# Patient Record
Sex: Male | Born: 1945 | Race: White | Hispanic: No | Smoking: Never smoker
Health system: Southern US, Community
[De-identification: ages and names within clinical notes are randomized; demographics above are authoritative.]

## PROBLEM LIST (undated history)

## (undated) DIAGNOSIS — H269 Unspecified cataract: Secondary | ICD-10-CM

## (undated) DIAGNOSIS — F988 Other specified behavioral and emotional disorders with onset usually occurring in childhood and adolescence: Secondary | ICD-10-CM

## (undated) DIAGNOSIS — M1990 Unspecified osteoarthritis, unspecified site: Secondary | ICD-10-CM

## (undated) DIAGNOSIS — N400 Enlarged prostate without lower urinary tract symptoms: Secondary | ICD-10-CM

## (undated) DIAGNOSIS — G4730 Sleep apnea, unspecified: Secondary | ICD-10-CM

## (undated) DIAGNOSIS — T7840XA Allergy, unspecified, initial encounter: Secondary | ICD-10-CM

## (undated) DIAGNOSIS — K589 Irritable bowel syndrome without diarrhea: Secondary | ICD-10-CM

## (undated) DIAGNOSIS — F32A Depression, unspecified: Secondary | ICD-10-CM

## (undated) DIAGNOSIS — E7800 Pure hypercholesterolemia, unspecified: Secondary | ICD-10-CM

## (undated) DIAGNOSIS — L719 Rosacea, unspecified: Secondary | ICD-10-CM

## (undated) DIAGNOSIS — F329 Major depressive disorder, single episode, unspecified: Secondary | ICD-10-CM

## (undated) DIAGNOSIS — I82619 Acute embolism and thrombosis of superficial veins of unspecified upper extremity: Secondary | ICD-10-CM

## (undated) DIAGNOSIS — C4491 Basal cell carcinoma of skin, unspecified: Secondary | ICD-10-CM

## (undated) DIAGNOSIS — R011 Cardiac murmur, unspecified: Secondary | ICD-10-CM

## (undated) DIAGNOSIS — N62 Hypertrophy of breast: Secondary | ICD-10-CM

## (undated) DIAGNOSIS — F419 Anxiety disorder, unspecified: Secondary | ICD-10-CM

## (undated) DIAGNOSIS — K219 Gastro-esophageal reflux disease without esophagitis: Secondary | ICD-10-CM

## (undated) HISTORY — PX: CLAVICLE SURGERY: SHX598

## (undated) HISTORY — PX: SHOULDER ARTHROSCOPY W/ ROTATOR CUFF REPAIR: SHX2400

## (undated) HISTORY — DX: Irritable bowel syndrome, unspecified: K58.9

## (undated) HISTORY — PX: COLONOSCOPY: SHX174

## (undated) HISTORY — DX: Rosacea, unspecified: L71.9

## (undated) HISTORY — PX: TONSILLECTOMY: SUR1361

## (undated) HISTORY — DX: Other specified behavioral and emotional disorders with onset usually occurring in childhood and adolescence: F98.8

## (undated) HISTORY — DX: Benign prostatic hyperplasia without lower urinary tract symptoms: N40.0

## (undated) HISTORY — PX: KNEE ARTHROSCOPY: SHX127

## (undated) HISTORY — DX: Acute embolism and thrombosis of superficial veins of unspecified upper extremity: I82.619

## (undated) HISTORY — PX: NEUROMA SURGERY: SHX722

## (undated) HISTORY — PX: BASAL CELL CARCINOMA EXCISION: SHX1214

## (undated) HISTORY — PX: JOINT REPLACEMENT: SHX530

## (undated) HISTORY — DX: Unspecified osteoarthritis, unspecified site: M19.90

## (undated) HISTORY — PX: BUNIONECTOMY: SHX129

## (undated) HISTORY — DX: Major depressive disorder, single episode, unspecified: F32.9

## (undated) HISTORY — PX: ELBOW SURGERY: SHX618

## (undated) HISTORY — DX: Sleep apnea, unspecified: G47.30

## (undated) HISTORY — DX: Gastro-esophageal reflux disease without esophagitis: K21.9

## (undated) HISTORY — PX: PLANTAR FASCIA RELEASE: SHX2239

## (undated) HISTORY — DX: Allergy, unspecified, initial encounter: T78.40XA

## (undated) HISTORY — PX: CARPAL TUNNEL RELEASE: SHX101

## (undated) HISTORY — PX: CATARACT EXTRACTION W/ INTRAOCULAR LENS  IMPLANT, BILATERAL: SHX1307

## (undated) HISTORY — PX: MOUTH SURGERY: SHX715

## (undated) HISTORY — DX: Anxiety disorder, unspecified: F41.9

## (undated) HISTORY — PX: HERNIA REPAIR: SHX51

## (undated) HISTORY — DX: Unspecified cataract: H26.9

## (undated) HISTORY — DX: Depression, unspecified: F32.A

## (undated) HISTORY — PX: CLOSED REDUCTION HAND FRACTURE: SHX973

---

## 1995-12-16 DIAGNOSIS — C4491 Basal cell carcinoma of skin, unspecified: Secondary | ICD-10-CM

## 1995-12-16 HISTORY — DX: Basal cell carcinoma of skin, unspecified: C44.91

## 1996-04-11 DIAGNOSIS — C4491 Basal cell carcinoma of skin, unspecified: Secondary | ICD-10-CM

## 1996-04-11 HISTORY — DX: Basal cell carcinoma of skin, unspecified: C44.91

## 1999-06-03 HISTORY — PX: INGUINAL HERNIA REPAIR: SUR1180

## 1999-06-28 ENCOUNTER — Ambulatory Visit (HOSPITAL_BASED_OUTPATIENT_CLINIC_OR_DEPARTMENT_OTHER): Admission: RE | Admit: 1999-06-28 | Discharge: 1999-06-28 | Payer: Self-pay | Admitting: Surgery

## 2002-05-02 ENCOUNTER — Encounter: Admission: RE | Admit: 2002-05-02 | Discharge: 2002-06-28 | Payer: Self-pay | Admitting: Neurosurgery

## 2003-07-19 ENCOUNTER — Encounter: Admission: RE | Admit: 2003-07-19 | Discharge: 2003-10-17 | Payer: Self-pay | Admitting: Family Medicine

## 2004-07-22 ENCOUNTER — Ambulatory Visit (HOSPITAL_COMMUNITY): Admission: RE | Admit: 2004-07-22 | Discharge: 2004-07-22 | Payer: Self-pay | Admitting: Gastroenterology

## 2004-07-23 ENCOUNTER — Encounter (INDEPENDENT_AMBULATORY_CARE_PROVIDER_SITE_OTHER): Payer: Self-pay | Admitting: *Deleted

## 2005-09-30 ENCOUNTER — Ambulatory Visit: Payer: Self-pay | Admitting: Internal Medicine

## 2005-10-09 ENCOUNTER — Ambulatory Visit: Payer: Self-pay | Admitting: Cardiology

## 2005-10-14 ENCOUNTER — Ambulatory Visit: Payer: Self-pay | Admitting: Internal Medicine

## 2005-11-11 ENCOUNTER — Ambulatory Visit: Payer: Self-pay | Admitting: Internal Medicine

## 2005-11-18 ENCOUNTER — Ambulatory Visit (HOSPITAL_COMMUNITY): Admission: RE | Admit: 2005-11-18 | Discharge: 2005-11-18 | Payer: Self-pay | Admitting: Internal Medicine

## 2006-01-21 ENCOUNTER — Ambulatory Visit: Payer: Self-pay | Admitting: Internal Medicine

## 2006-02-19 ENCOUNTER — Ambulatory Visit: Payer: Self-pay | Admitting: Internal Medicine

## 2007-01-27 DIAGNOSIS — D229 Melanocytic nevi, unspecified: Secondary | ICD-10-CM

## 2007-01-27 HISTORY — DX: Melanocytic nevi, unspecified: D22.9

## 2009-09-14 ENCOUNTER — Encounter: Admission: RE | Admit: 2009-09-14 | Discharge: 2009-09-14 | Payer: Self-pay | Admitting: Family Medicine

## 2010-06-26 ENCOUNTER — Other Ambulatory Visit: Payer: Self-pay | Admitting: Dermatology

## 2010-10-18 NOTE — Op Note (Signed)
NAME:  OGLE, HOEFFNER NO.:  [id number]   MEDICAL RECORD NO.:  [id number]          PATIENT TYPE:  AMB   LOCATION:  ENDO                         FACILITY:  [facility]   PHYSICIAN:  Graylin Shiver, M.D.   DATE OF BIRTH:  [birth year]   DATE OF PROCEDURE:  07/22/2004  DATE OF DISCHARGE:                                 OPERATIVE REPORT   PROCEDURE:  Esophagogastroduodenoscopy.   INDICATIONS:  Chronic heartburn.   Informed consent was obtained after explanation of the risks of bleeding,  infection and perforation.   PREMEDICATION:  Fentanyl 75 mcg IV, Versed 7.5 milligrams IV.   PROCEDURE:  With the patient in the left lateral decubitus position, the  Olympus gastroscope was inserted into the oropharynx and passed into the  esophagus. It was advanced down the esophagus then into the stomach and into  the duodenum. The second portion and bulb of the duodenum looked normal. The  stomach looked normal in its entirety including the upper fundus and cardia  seen on retroflexion. The esophagus looked normal. The esophagogastric  junction was at 40 cm. He tolerated the procedure well without  complications.   IMPRESSION:  Normal esophagogastroduodenoscopy.   In view of this patient's heartburn and relief with Prilosec I would  recommend that he remain on this medication for symptomatic control. I  suspect he has gastroesophageal reflux causing his heartburn. There is no  evidence of mucosal damage.      SFG/MEDQ  D:  07/22/2004  T:  07/23/2004  Job:  [id number]   cc:   Jethro Bastos, M.D.  [address]  [place]  [place] [address]  Fax: [phone]

## 2010-10-18 NOTE — Assessment & Plan Note (Signed)
[facility]                               PULMONARY OFFICE NOTE   NAME:FORSTERQuince, Santana                     MRN:          [id number]  DATE:01/21/2006                            DOB:          01-01-1946    SUBJECTIVE:  This is pulmonary/final followup. This  is a 65 year old white  male with chronic cough since 2006 and a negative methacholine challenge  just documented November 18, 2005 as well as previous negative sinus CT scan,  who has failed to respond to treatment directed at rhinitis, reflux, and  asthma and mainly complains of a sensation of postnasal drainage that did  not improve even on Bromophed.  However, overall, the cough is better since  starting PPIs b.i.d..   PHYSICAL EXAMINATION:  GENERAL:  He is pleasant white man in no acute  distress.  VITAL SIGNS:  Stable vital signs.  HEENT:  Minimal nonspecific turbinate edema.  Oropharynx is clear.  NECK:  Supple without cervical adenopathy or tenderness.  Trachea midline.  CHEST:  Lung fields are perfectly clear bilaterally to auscultation and  percussion.  HEART:  Regular rhythm without murmur or gallop.  ABDOMEN:  Soft, benign.  EXTREMITIES:  Warm without calf tenderness. No edema.   IMPRESSION/PLAN:  Cough, secondary to persistent sensation of postnasal  drainage in this patient who did not appear to respond to treatment directed  at reflux, asthma, and rhinitis (including Singulair) and has a negative  methacholine challenge test, although he did appear to respond at least  transiently to systemic steroids.  He is convinced that he has allergies,  and I think it is reasonable to refer him to the [facility] for  screening.  In the meantime, the best way to approach the postnasal drainage  is to add Astelin.  The other option might be to try a course of inhaled  steroids, which the record indicates has not been attempted yet, but I will  defer that issue to the Allergy Clinic.   I  suggested that he stop PPI therapy (reverse of a therapeutic trial), and  he stated, Well, I am much better off than I was before I started the  Prilosec.  Therefore, I told him it is fine to continue with it and see if  adding Astelin 2 puffs at bedtime and p.r.n. adds anything additional in  terms of control of his sensation of postnasal drainage.  Pulmonary  followup, however, can be p.r.n.Charlaine Dalton. Sherene Sires, MD, [facility]   MBW/MedQ  DD:  01/21/2006  DT:  01/22/2006  Job #:  [id number]   cc:   Jethro Bastos, MD

## 2010-10-18 NOTE — Op Note (Signed)
NAME:  FRANKO, HILLIKER NO.:  [id number]   MEDICAL RECORD NO.:  [id number]          PATIENT TYPE:  AMB   LOCATION:  ENDO                         FACILITY:  [facility]   PHYSICIAN:  Graylin Shiver, M.D.   DATE OF BIRTH:  1945-09-03   DATE OF PROCEDURE:  07/22/2004  DATE OF DISCHARGE:                                 OPERATIVE REPORT   PROCEDURE:  Colonoscopy.   INDICATIONS:  Intermittent rectal bleeding.   Informed consent was obtained after explanation of the risks of bleeding,  infection and perforation.   PREMEDICATION:  The procedure was done after an EGD without any further  medications given.   PROCEDURE:  With the patient in the left lateral decubitus position, a  rectal exam was performed. No masses were felt. The Olympus colonoscope was  inserted into the rectum and advanced around the colon to the cecum. Cecal  landmarks were identified. The cecum looked normal. In the ascending colon  there was a small 3-4 mm sessile polyp biopsied off with cold forceps. The  transverse colon looked normal. The descending colon, sigmoid and rectum all  looked normal. He tolerated the procedure well without complications.   IMPRESSION:  Small ascending colon polyp.   PLAN:  The pathology will be checked.      SFG/MEDQ  D:  07/22/2004  T:  07/23/2004  Job:  [id number]   cc:   Jethro Bastos, M.D.  [address]  [place]  [place] [address]  Fax: [phone]

## 2010-10-18 NOTE — Assessment & Plan Note (Signed)
[facility]                                ALLERGY OFFICE NOTE   Tanner [name], Tanner [name]                     MRN:          [id number]  DATE:02/19/2006                            DOB:          09/25/1945    REFERRING PHYSICIAN:  Charlaine Dalton. Sherene Sires, MD, FCCP   PROBLEM:  Allergy consultation courtesy of Dr. Sherene Sires for this 65 year old  gentleman with cough, nasal congestion and drainage.   HISTORY:  He is a never smoker who has been working with Dr. Sherene Sires for  chronic cough. A Methacholine challenge test in June 2007 was within lower  limits of normal. CT scan of the sinuses had been negative, and he did not  respond to an antihistamine decongestion product. Cough may have improved  some with PPI's. He says that working with Dr. Sherene Sires and Dr. Dorothe Pea, he has  failed to improve with a number of the common antihistamines, Singulair,  Astelin nasal spray or nasal steroids. Six days ago, he had what he calls a  typical fall and spring acute illness, described as pressure pain in the  ears, a full feeling in the frontal sinuses, much nasal discharge like  oatmeal and some increase in a perennial cough. He says he always has a  sense of drainage in the back of his throat. For a similar illness in the  mid 1970's, he had allergy skin testing, and was started on allergy shots,  but with his first two allergy injections he says he felt sick with cold  symptoms and fever, which put him to bed, so vaccine was stopped. Overall,  this pattern of chronic perennial dry cough and sensation of something in  the back of the throat have been fairly stable but with distinct seasonal  exacerbations.   MEDICATIONS:  Multivitamin, glucosamine, Ritalin, Wellbutrin XL 300 mg,  aspirin 325 mg, Astelin nasal spray sample, Prozac 20 mg, Prilosec 20 mg,  Bromfed PD.   REVIEW OF SYSTEMS:  Cough, sometimes dry, sometimes productive of mostly  clear mucus, occasional non-exertional,  non-stereotyped chest pains or  soreness, indigestion, headaches, nasal congestion, earaches, depression.  His cough varies only to the extent that it changes a little in character  from time to time. He admits definite reflux. Left nostril is more apt to be  congested. There has been no fever and no blood.   PAST MEDICAL HISTORY:  Depression and anxiety being treated by Dr.  Tomasa Rand. Tonsillectomy. 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 No other  heart or lung disease, gastrointestinal disorder, dysuria, or leg edema.   PAST SURGICAL HISTORY:  Surgeries for tonsils, neuroma, planter fasciitis,  bunion, AC joint repair and hand.   ALLERGIES:  No medication allergies. No intolerance to latex or aspirin.   SOCIAL HISTORY:  Never smoked, occasional alcohol, widowed with children. 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   ENVIRONMENTAL:  No history of urticaria, unusual reactions to food or insect  stings. Describes his home as not clean but then talks about  vacuuming it  twice a week. Indoor dog. No moisture problems. He has encasings on the  bedding, no feathers.   OBJECTIVE:  VITAL SIGNS: Weight 184 pounds, BP 122/78, Pulse regular 74,  room air saturation 99%.  GENERAL: Well built, well nourished man, somewhat pressured speech.  SKIN: Clear.  ADENOPATHY: None.  HEENT: Scarring left tympanic membrane.  Frequent throat clearing without  erythema or visible drainage.  No significant nasal obstruction.  CHEST: Slight inspiratory rasp heard at the neck and upper abdomen only on  hard inspiration. No wheeze, cough or rales.  HEART: Regular rhythm, no murmur or gallop.  EXTREMITIES: No cyanosis, clubbing or edema.  ABDOMEN: No enlargement of liver or spleen.  SKIN TEST: Positive histamine and negative diluent controls. Positive  puncture and intradermal reactions with strongest being for elm and  oak, but  additional significant reactions to some trees, house dust, and dust mite.   IMPRESSION:  1. Allergic rhinitis.  2. Cough, very suggestive of reflux induced cough.   PLAN:  1. He is going to check on the cost of allergy vaccine through his      insurance and consider whether he wants to try that therapy or not.  2. Depo-Medrol 80 mg IM.  3. Schedule return 6 weeks, earlier PRN with option to try allergy vaccine      if necessary.  4. Information on environmental precautions was given and discussed. I      appreciate the chance to meet him.                                   Clinton D. Maple Hudson, MD, FCCP, FACP   CDY/MedQ  DD:  02/19/2006  DT:  02/20/2006  Job #:  [id number]   cc:   Charlaine Dalton. Sherene Sires, MD, [facility]  Jethro Bastos, M.D.

## 2011-02-19 ENCOUNTER — Other Ambulatory Visit: Payer: Self-pay | Admitting: Dermatology

## 2011-02-19 DIAGNOSIS — C4492 Squamous cell carcinoma of skin, unspecified: Secondary | ICD-10-CM

## 2011-02-19 HISTORY — DX: Squamous cell carcinoma of skin, unspecified: C44.92

## 2011-07-08 ENCOUNTER — Other Ambulatory Visit: Payer: Self-pay | Admitting: Dermatology

## 2011-10-16 ENCOUNTER — Encounter: Payer: Self-pay | Admitting: Pulmonary Disease

## 2011-10-17 ENCOUNTER — Ambulatory Visit (INDEPENDENT_AMBULATORY_CARE_PROVIDER_SITE_OTHER): Payer: Medicare Other | Admitting: Pulmonary Disease

## 2011-10-17 ENCOUNTER — Encounter: Payer: Self-pay | Admitting: Pulmonary Disease

## 2011-10-17 VITALS — BP 118/82 | HR 67 | Temp 98.3°F | Ht 70.5 in | Wt 175.8 lb

## 2011-10-17 DIAGNOSIS — G4733 Obstructive sleep apnea (adult) (pediatric): Secondary | ICD-10-CM | POA: Insufficient documentation

## 2011-10-17 NOTE — Progress Notes (Signed)
  Subjective:    Patient ID: Tanner [name], male    DOB: [birth year], 66 y.o.   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 CPAP was titrated to 8 cm however a downward on this pressure should predominant hypopneas at 24 events per hour. Hence he was changed to BiPAP at 14/10 after titration study. However he could not find a comfortable mask. He states this is due to his oily skin. He seems to have had a large leak, he says he is multiple masks at home. He sleeps on his side in the mass could slip off. It was also felt that he had nostrils of different sizes in nasal prongs did not work well. He has not used CPAP for 2 years.  I have reviewed his prior studies, download data and consult reports from Dr. Vickey Huger. His bed partner relates that his sleepiness and snoring has improved. Epworth sleepiness score is 4/24. Bedtime is 11 PM to midnight, sleep latency is minimal, sleeps well with 1-2 awakenings without any post void sleep latency. He sleeps on his side with one pillow. Wakes up between 8:30 to 9 AM and feels rested, there is no dryness of mouth or morning headaches. He is lost 8 pounds in the last 3 years. He has been on amphetamine for attention deficit disorder since the 90s He has hyperlipidemia but no other cardiac risk factors   Review of Systems Constitutional: negative for anorexia, fevers and sweats  Eyes: negative for irritation, redness and visual disturbance  Ears, nose, mouth, throat, and face: negative for earaches, epistaxis, nasal congestion and sore throat  Respiratory: negative for cough, dyspnea on exertion, sputum and wheezing  Cardiovascular: negative for chest pain, dyspnea, lower extremity edema, orthopnea, palpitations and syncope  Gastrointestinal: negative  for abdominal pain, constipation, diarrhea, melena, nausea and vomiting  Genitourinary:negative for dysuria, frequency and hematuria  Hematologic/lymphatic: negative for bleeding, easy bruising and lymphadenopathy  Musculoskeletal:negative for arthralgias, muscle weakness and stiff joints  Neurological: negative for coordination problems, gait problems, headaches and weakness  Endocrine: negative for diabetic symptoms including polydipsia, polyuria and weight loss     Objective:   Physical Exam  Gen. Pleasant, well-nourished, in no distress, normal affect ENT - no lesions, no post nasal drip, small chin, class 2 airway Neck: No JVD, no thyromegaly, no carotid bruits Lungs: no use of accessory muscles, no dullness to percussion, clear without rales or rhonchi  Cardiovascular: Rhythm regular, heart sounds  normal, no murmurs or gallops, no peripheral edema Abdomen: soft and non-tender, no hepatosplenomegaly, BS normal. Musculoskeletal: No deformities, no cyanosis or clubbing Neuro:  alert, non focal       Assessment & Plan:

## 2011-10-17 NOTE — Patient Instructions (Signed)
Call the sleep lab [phone] for appt for mask fit We will set you up with DME to check, adjust your machine & get you supplies for another CPAP trial Call me in a week to report how this is working If not, then we can try dental appliance

## 2011-10-17 NOTE — Assessment & Plan Note (Addendum)
I still think that CPAP is the best and most effective option for moderate obstructive sleep apnea. Hopefully we can get him the right mask interface.  He will Call the sleep lab  for mask fit/ desensitization appt We will set you up with DME to check, adjust your machine & get you supplies for another CPAP trial Call me in a week to report how this is working If not, then we can try dental appliance  compliance with goal of at least 4-6 hrs every night is the expectation. Advised against medications with sedative side effects Cautioned against driving when sleepy - understanding that sleepiness will vary on a day to day basis

## 2011-10-21 ENCOUNTER — Ambulatory Visit (HOSPITAL_BASED_OUTPATIENT_CLINIC_OR_DEPARTMENT_OTHER): Payer: Medicare Other | Attending: Pulmonary Disease

## 2011-10-28 ENCOUNTER — Encounter (INDEPENDENT_AMBULATORY_CARE_PROVIDER_SITE_OTHER): Payer: Self-pay | Admitting: General Surgery

## 2011-11-12 ENCOUNTER — Other Ambulatory Visit: Payer: Self-pay | Admitting: Dermatology

## 2012-01-09 ENCOUNTER — Telehealth: Payer: Self-pay | Admitting: Pulmonary Disease

## 2012-01-09 DIAGNOSIS — G4733 Obstructive sleep apnea (adult) (pediatric): Secondary | ICD-10-CM

## 2012-01-09 NOTE — Telephone Encounter (Signed)
I spoke with pt about results. He voiced his understanding and gree'd to be set up with bipap. Order has been sent to [facility]. Will send to them to make sure they received order. Please advise thanks

## 2012-01-09 NOTE — Telephone Encounter (Signed)
Order faxed to [facility] to set pt up with Auto BiPap IPAP 10-20, EPAP 5-15. Download to be sent in 1 month to Dr. Vassie Loll. Rhonda J Cobb

## 2012-01-09 NOTE — Telephone Encounter (Signed)
Download 8/13 on auto- avg pr 11 cm Residual AHI 19/h, predom central apneas He will need BiPAP - does he have this machine already ? If not, OK to write order to DME for auto BiPAP - IPAP 10-20 EPAP 5-15, download in 1 month

## 2012-01-30 ENCOUNTER — Telehealth: Payer: Self-pay | Admitting: Pulmonary Disease

## 2012-01-30 NOTE — Telephone Encounter (Signed)
Spoke with pt. He is wanting to know how come RA chose to start him on BIPAP rather than CPAP. He was apparently under the impression that he was to start CPAP. RA, please advise, thanks

## 2012-02-01 NOTE — Telephone Encounter (Signed)
Because he had central apneas on his cpap download & bipap works better for central apneas

## 2012-02-03 NOTE — Telephone Encounter (Signed)
lmomtcb x1 

## 2012-02-04 NOTE — Telephone Encounter (Signed)
lmomtcb x2 for pt 

## 2012-02-05 NOTE — Telephone Encounter (Signed)
LMTCBx3 on home and work number listed.Carron Curie, CMA

## 2012-02-06 NOTE — Telephone Encounter (Signed)
lmomtcb x4--advised pt we have tried to reach him multiple times without a response back and if he needed anything further to give our office a call back

## 2012-02-17 ENCOUNTER — Telehealth: Payer: Self-pay | Admitting: Pulmonary Disease

## 2012-02-17 NOTE — Telephone Encounter (Signed)
LMTCB

## 2012-02-18 NOTE — Telephone Encounter (Signed)
lmomtcb x 2  

## 2012-02-19 NOTE — Telephone Encounter (Signed)
lmtcb x3 

## 2012-02-20 ENCOUNTER — Telehealth: Payer: Self-pay | Admitting: Pulmonary Disease

## 2012-02-20 NOTE — Telephone Encounter (Signed)
Lmomtcb.  Will sign off per protocol.

## 2012-02-20 NOTE — Telephone Encounter (Signed)
Tanner Milch., MD 02/01/2012 5:36 PM Signed  Because he had central apneas on his cpap download & bipap works better for central apneas Tanner [name], [facility] 01/30/2012 4:13 PM Signed  Spoke with pt. He is wanting to know how come RA chose to start him on BIPAP rather than CPAP. He was apparently under the impression that he was to start CPAP. RA, please advise, thanks  Spoke with patient-aware of reason for needing BiPAP vs CPAP; pt is willing to go forward with this. Per Almyra Free order from 01-09-12 can still be used and she is taking care of this now. Pt aware to expect a call from [facility] for setting up BiPAP. Will forward to RA as an FYI.  Pt states he had been out of town and this is the reason for not getting back to our office or [facility] about this matter.

## 2012-04-09 ENCOUNTER — Telehealth: Payer: Self-pay | Admitting: Pulmonary Disease

## 2012-04-09 NOTE — Telephone Encounter (Signed)
Will forward to Dr. Vassie Loll to see if he has download in his look at folder with him. Pt is aware will call once we know.  Please advise Dr. Vassie Loll thanks

## 2012-04-12 ENCOUNTER — Telehealth: Payer: Self-pay | Admitting: Pulmonary Disease

## 2012-04-12 NOTE — Telephone Encounter (Signed)
bipap download 10/13 on auto Residual AHI 23/h Good usage avg pr 12 cm Pl arrange FU OV

## 2012-04-14 NOTE — Telephone Encounter (Signed)
See phone note 04/14/12

## 2012-04-14 NOTE — Telephone Encounter (Signed)
I spoke with patient about results and he verbalized understanding and had no questions. Pt scheduled to see RA 04/16/12 at 9 am.

## 2012-04-16 ENCOUNTER — Encounter: Payer: Self-pay | Admitting: Pulmonary Disease

## 2012-04-16 ENCOUNTER — Ambulatory Visit (INDEPENDENT_AMBULATORY_CARE_PROVIDER_SITE_OTHER): Payer: Medicare Other | Admitting: Pulmonary Disease

## 2012-04-16 VITALS — BP 102/60 | HR 64 | Temp 97.3°F | Ht 70.0 in | Wt 183.0 lb

## 2012-04-16 DIAGNOSIS — G4733 Obstructive sleep apnea (adult) (pediatric): Secondary | ICD-10-CM

## 2012-04-16 NOTE — Patient Instructions (Signed)
Lets hold off on CPAP therapy Call me if symptoms worse

## 2012-04-16 NOTE — Progress Notes (Signed)
  Subjective:    Patient ID: Tanner [name], male    DOB: 03/27/1946, 66 y.o.   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 CPAP was titrated to 8 cm however a downward on this pressure should predominant hypopneas at 24 events per hour. Hence he was changed to BiPAP at 14/10 after titration study. However he could not find a comfortable mask. He states this is due to his oily skin. He seems to have had a large leak, he says he is multiple masks at home. He sleeps on his side in the mass could slip off. It was also felt that he had nostrils of different sizes in nasal prongs did not work well. He has not used CPAP for 2 years.  I have reviewed his prior studies, download data and consult reports from Dr. Vickey Huger.  His bed partner relates that his sleepiness and snoring has improved. Epworth sleepiness score is 4/24. Bedtime is 11 PM to midnight, sleep latency is minimal, sleeps well with 1-2 awakenings without any post void sleep latency. He sleeps on his side with one pillow. Wakes up between 8:30 to 9 AM and feels rested, there is no dryness of mouth or morning headaches. He is lost 8 pounds in the last 3 years.  He has been on amphetamine for attention deficit disorder since the 90s  He has hyperlipidemia but no other cardiac risk factors  04/16/2012 We performed a PAP trial  - he did not feel any improvement on CPAP or bipap although toelrated bipap better & was able to use it longer Data : Download 8/13 on auto- avg pr 11 cm  Residual AHI 19/h, predom central apneas >> autobipap bipap download 10/13 on auto  Residual AHI 23/h  Good usage  avg pr 12 cm     Review of Systems neg for any significant sore throat, dysphagia, itching, sneezing, nasal congestion or excess/ purulent  secretions, fever, chills, sweats, unintended wt loss, pleuritic or exertional cp, hempoptysis, orthopnea pnd or change in chronic leg swelling. Also denies presyncope, palpitations, heartburn, abdominal pain, nausea, vomiting, diarrhea or change in bowel or urinary habits, dysuria,hematuria, rash, arthralgias, visual complaints, headache, numbness weakness or ataxia.     Objective:   Physical Exam  Gen. Pleasant, in no distress ENT - no lesions, no post nasal drip, class 2 airway Neck: No JVD, no thyromegaly, no carotid bruits Lungs: no use of accessory muscles, no dullness to percussion, decreased without rales or rhonchi  Cardiovascular: Rhythm regular, heart sounds  normal, no murmurs or gallops, no peripheral edema Musculoskeletal: No deformities, no cyanosis or clubbing , no tremors       Assessment & Plan:

## 2012-04-16 NOTE — Assessment & Plan Note (Signed)
Moderate , RDI 22/h Complex - with centrals emerging on CPAP & not corrected fully on autoBipAP  I discussed OSA as a risk factor for heart disease - he is relatively low risk & opteed not to continue with CPAP therpay. Since he did not have any subjective improvement with CPAP or bipap, I do think this is ok. We discussed alternatives such as dental appliance.  We discussed symptoms that would prompt him to call us again but for now simply observation.

## 2012-07-14 ENCOUNTER — Other Ambulatory Visit: Payer: Self-pay | Admitting: Physician Assistant

## 2012-10-13 ENCOUNTER — Other Ambulatory Visit: Payer: Self-pay | Admitting: Family Medicine

## 2012-10-13 ENCOUNTER — Ambulatory Visit
Admission: RE | Admit: 2012-10-13 | Discharge: 2012-10-13 | Disposition: A | Discharge status: Home / Self Care | Payer: Medicare Other | Source: Ambulatory Visit | Attending: Family Medicine | Admitting: Family Medicine

## 2012-10-13 DIAGNOSIS — M25551 Pain in right hip: Secondary | ICD-10-CM

## 2012-10-22 ENCOUNTER — Other Ambulatory Visit: Payer: Self-pay | Admitting: Family Medicine

## 2012-10-22 DIAGNOSIS — M25551 Pain in right hip: Secondary | ICD-10-CM

## 2012-10-27 ENCOUNTER — Ambulatory Visit
Admission: RE | Admit: 2012-10-27 | Discharge: 2012-10-27 | Disposition: A | Discharge status: Home / Self Care | Payer: Medicare Other | Source: Ambulatory Visit | Attending: Family Medicine | Admitting: Family Medicine

## 2012-10-27 DIAGNOSIS — M25551 Pain in right hip: Secondary | ICD-10-CM

## 2013-01-19 ENCOUNTER — Other Ambulatory Visit: Payer: Self-pay | Admitting: Dermatology

## 2013-02-02 ENCOUNTER — Encounter (INDEPENDENT_AMBULATORY_CARE_PROVIDER_SITE_OTHER): Payer: Self-pay | Admitting: Surgery

## 2013-02-02 ENCOUNTER — Ambulatory Visit (INDEPENDENT_AMBULATORY_CARE_PROVIDER_SITE_OTHER): Payer: Medicare Other | Admitting: Surgery

## 2013-02-02 VITALS — BP 138/80 | HR 64 | Temp 98.1°F | Resp 14 | Ht 70.5 in | Wt 190.0 lb

## 2013-02-02 DIAGNOSIS — K429 Umbilical hernia without obstruction or gangrene: Secondary | ICD-10-CM

## 2013-02-02 NOTE — Progress Notes (Signed)
General Surgery [facility]  Chief Complaint  Patient presents with  . New Evaluation    eval umbilical hernia - referral from Dr. Carilyn Goodpasture Koirala    HISTORY: Patient is a 67 year old male referred by his primary care physician for management of umbilical hernia. Hernia has been present for one to 2 years. He causes mild discomfort. Patient is also concerned about a possible ventral hernia in the epigastrium. He denies any signs or symptoms of intestinal obstruction. He has undergone a previous left inguinal hernia repair in 2001.  Past Medical History  Diagnosis Date  . IBS (irritable bowel syndrome)   . Rosacea   . Depression   . ADD (attention deficit disorder)   . Esophageal reflux   . BPH (benign prostatic hypertrophy)   . OSA (obstructive sleep apnea)   . Arthritis   . Cancer     basal skin     Current Outpatient Prescriptions  Medication Sig Dispense Refill  . amphetamine-dextroamphetamine (ADDERALL XR) 30 MG 24 hr capsule Take 30 mg by mouth daily.      Marland Kitchen aspirin 81 MG tablet Take 81 mg by mouth daily.      . cetirizine (ZYRTEC) 10 MG tablet Take 10 mg by mouth daily.      . cholecalciferol (VITAMIN D) 1000 UNITS tablet Take 1,000 Units by mouth daily.      . fish oil-omega-3 fatty acids 1000 MG capsule Take 1 capsule by mouth 2 (two) times daily.      . Glucosamine-Chondroit-Vit C-Mn (GLUCOSAMINE 1500 COMPLEX PO) Take 2 capsules by mouth daily.      Marland Kitchen ibuprofen (ADVIL,MOTRIN) 200 MG tablet Take 200 mg by mouth every 6 (six) hours as needed.      . metroNIDAZOLE (METROCREAM) 0.75 % cream       . minocycline (MINOCIN,DYNACIN) 100 MG capsule       . omeprazole (PRILOSEC) 40 MG capsule Take 40 mg by mouth daily.      . sildenafil (VIAGRA) 100 MG tablet 1/4 tablet as needed      . simvastatin (ZOCOR) 10 MG tablet Take 10 mg by mouth daily.      Marland Kitchen tretinoin (RETIN-A) 0.025 % cream       . Vilazodone HCl (VIIBRYD) 10 MG TABS 1 tablet every other day       . Multiple Vitamin (MULTIVITAMIN) tablet Take 1 tablet by mouth daily.       No current facility-administered medications for this visit.    No Known Allergies  Family History  Problem Relation Age of Onset  . Heart attack Father 31  . Heart disease Father   . Lung disease Mother 64  . Diabetes Brother     History   Social History  . Marital Status: Widowed    Spouse Name: N/A    Number of Children: 2  . Years of Education: N/A   Occupational History  . retired    Social History Main Topics  . Smoking status: Never Smoker   . Smokeless tobacco: Never Used  . Alcohol Use: Yes  . Drug Use: No  . Sexual Activity: None   Other Topics Concern  . None   Social History Narrative  . None    REVIEW OF SYSTEMS - PERTINENT POSITIVES ONLY: Denies signs or symptoms of intestinal obstruction. No other abdominal operations. Intermittent discomfort radiating from the umbilicus towards the left groin.  EXAM: Filed Vitals:   02/02/13 1133  BP: 138/80  Pulse: 64  Temp: 98.1 F (36.7 C)  Resp: 14    HEENT: normocephalic; pupils equal and reactive; sclerae clear; dentition good; mucous membranes moist NECK:  No palpable masses in the thyroid bed; symmetric on extension; no palpable anterior or posterior cervical lymphadenopathy; no supraclavicular masses; no tenderness CHEST: clear to auscultation bilaterally without rales, rhonchi, or wheezes CARDIAC: regular rate and rhythm without significant murmur; peripheral pulses are full ABDOMEN: soft without distension; bowel sounds present; no mass; no hepatosplenomegaly; umbilical hernia is moderate in size; with manipulation the umbilical hernia is reducible and fascial defect measures approximately 1 cm in diameter; with setup maneuver and leg lifts there is a moderate sized rectus diastases. There is no sign of ventral hernia in the epigastrium. There is no palpable fascial defect in the epigastrium. EXT:  non-tender without  edema; no deformity NEURO: no gross focal deficits; no sign of tremor   LABORATORY RESULTS: See [facility] (CHL-Epic) for most recent results  RADIOLOGY RESULTS: See [facility] (CHL-Epic) for most recent results  IMPRESSION: #1 umbilical hernia, reducible, symptomatic #2 rectus diastases, moderate  PLAN: I discussed the above findings at length with the patient and his significant other. I provided him with written literature on umbilical hernia repair. We discussed the use of prosthetic mesh. We discussed restrictions on his activities following the procedure. We discussed the risk of recurrence of his umbilical hernia been less than 5%. He understands and wishes to proceed with surgery in the near future.  We also discussed rectus diastases. I explained that this is not a dangerous problem and does not require surgical repair. Any surgical repair would be performed by a plastic surgeon and would involve abdominoplasty. Patient understands.  The risks and benefits of the procedure have been discussed at length with the patient.  The patient understands the proposed procedure, potential alternative treatments, and the course of recovery to be expected.  All of the patient's questions have been answered at this time.  The patient wishes to proceed with surgery.  Velora Heckler, MD, FACS General & Endocrine Surgery [facility]  Primary Care Physician: Darrow Bussing, MD

## 2013-02-02 NOTE — Patient Instructions (Signed)
[facility]  HERNIA REPAIR POST OP INSTRUCTIONS  Always review your discharge instruction sheet given to you by the facility where your surgery was performed.  1. A  prescription for pain medication may be given to you upon discharge.  Take your pain medication as prescribed.  If narcotic pain medicine is not needed, then you may take acetaminophen (Tylenol) or ibuprofen (Advil) as needed.  2. Take your usually prescribed medications unless otherwise directed.  3. If you need a refill on your pain medication, please contact your pharmacy.  They will contact our office to request authorization. Prescriptions will not be filled after 5 pm daily or on weekends.  4. You should follow a light diet the first 24 hours after arrival home, such as soup and crackers or toast.  Be sure to include plenty of fluids daily.  Resume your normal diet the day after surgery.  5. Most patients will experience some swelling and bruising around the surgical site.  Ice packs and reclining will help.  Swelling and bruising can take several days to resolve.   6. It is common to experience some constipation if taking pain medication after surgery.  Increasing fluid intake and taking a stool softener (such as Colace) will usually help or prevent this problem from occurring.  A mild laxative (Milk of Magnesia or Miralax) should be taken according to package directions if there are no bowel movements after 48 hours.  7. Unless discharge instructions indicate otherwise, you may remove your bandages 24-48 hours after surgery, and you may shower at that time.  You may have steri-strips (small skin tapes) in place directly over the incision.  These strips should be left on the skin for 7-10 days.  If your surgeon used skin glue on the incision, you may shower in 24 hours.  The glue will flake off over the next 2-3 weeks.  Any sutures or staples will be removed at the office during your follow-up  visit.  8. ACTIVITIES:  You may resume regular (light) daily activities beginning the next day-such as daily self-care, walking, climbing stairs-gradually increasing activities as tolerated.  You may have sexual intercourse when it is comfortable.  Refrain from any heavy lifting or straining until approved by your doctor.  You may drive when you are no longer taking prescription pain medication, you can comfortably wear a seatbelt, and you can safely maneuver your car and apply brakes.  9. You should see your doctor in the office for a follow-up appointment approximately 2-3 weeks after your surgery.  Make sure that you call for this appointment within a day or two after you arrive home to insure a convenient appointment time. 10.   WHEN TO CALL YOUR DOCTOR: 1. Fever greater than 101.0 2. Inability to urinate 3. Persistent nausea and/or vomiting 4. Extreme swelling or bruising 5. Continued bleeding from incision 6. Increased pain, redness, or drainage from the incision  The clinic staff is available to answer your questions during regular business hours.  Please don't hesitate to call and ask to speak to one of the nurses for clinical concerns.  If you have a medical emergency, go to the nearest emergency room or call 911.  A surgeon from [facility] is always on call for the hospital.   [facility] [address], [address], , [place]  [address]  [phone] ? [phone] ? FAX [phone]  [email or web address]   

## 2013-02-21 ENCOUNTER — Telehealth (INDEPENDENT_AMBULATORY_CARE_PROVIDER_SITE_OTHER): Payer: Self-pay

## 2013-02-21 NOTE — Telephone Encounter (Signed)
This has been forwarded to Dr Gerrit Friends.

## 2013-02-21 NOTE — Telephone Encounter (Signed)
Debbie from surgical center called to let Dr Gerrit Friends know patient still has sutures in his mouth from his recent dental surgery. He has 3 more days of abx to take. His hernia surgery is scheduled next Tuesday 9/30. Debbie wants to know if this will affect his surgery in any way. Please call her at [phone] ext [phone].

## 2013-02-22 ENCOUNTER — Telehealth (INDEPENDENT_AMBULATORY_CARE_PROVIDER_SITE_OTHER): Payer: Self-pay

## 2013-02-22 NOTE — Telephone Encounter (Signed)
LMOM for Debbie at [facility] that ok to proceed with surgery per Dr Gerrit Friends.

## 2013-03-01 DIAGNOSIS — K429 Umbilical hernia without obstruction or gangrene: Secondary | ICD-10-CM

## 2013-03-01 HISTORY — PX: UMBILICAL HERNIA REPAIR: SHX196

## 2013-03-02 ENCOUNTER — Other Ambulatory Visit (INDEPENDENT_AMBULATORY_CARE_PROVIDER_SITE_OTHER): Payer: Self-pay | Admitting: *Deleted

## 2013-03-02 MED ORDER — HYDROCODONE-ACETAMINOPHEN 5-325 MG PO TABS: 1.0000 | ORAL_TABLET | ORAL | Status: DC | PRN

## 2013-03-04 ENCOUNTER — Encounter (INDEPENDENT_AMBULATORY_CARE_PROVIDER_SITE_OTHER): Payer: Self-pay | Admitting: General Surgery

## 2013-03-04 ENCOUNTER — Emergency Department (HOSPITAL_COMMUNITY): Payer: Medicare Other

## 2013-03-04 ENCOUNTER — Telehealth (INDEPENDENT_AMBULATORY_CARE_PROVIDER_SITE_OTHER): Payer: Self-pay | Admitting: General Surgery

## 2013-03-04 ENCOUNTER — Emergency Department (HOSPITAL_COMMUNITY)
Admission: EM | Admit: 2013-03-04 | Discharge: 2013-03-04 | Disposition: A | Discharge status: Home / Self Care | Payer: Medicare Other | Attending: Emergency Medicine | Admitting: Emergency Medicine

## 2013-03-04 ENCOUNTER — Encounter (HOSPITAL_COMMUNITY): Payer: Self-pay

## 2013-03-04 ENCOUNTER — Ambulatory Visit (INDEPENDENT_AMBULATORY_CARE_PROVIDER_SITE_OTHER): Payer: Medicare Other | Admitting: General Surgery

## 2013-03-04 VITALS — BP 98/60 | HR 68 | Temp 97.6°F | Resp 14 | Ht 70.0 in | Wt 188.2 lb

## 2013-03-04 DIAGNOSIS — Z8669 Personal history of other diseases of the nervous system and sense organs: Secondary | ICD-10-CM | POA: Insufficient documentation

## 2013-03-04 DIAGNOSIS — L0390 Cellulitis, unspecified: Secondary | ICD-10-CM

## 2013-03-04 DIAGNOSIS — L0291 Cutaneous abscess, unspecified: Secondary | ICD-10-CM

## 2013-03-04 DIAGNOSIS — R109 Unspecified abdominal pain: Secondary | ICD-10-CM | POA: Insufficient documentation

## 2013-03-04 DIAGNOSIS — Z79899 Other long term (current) drug therapy: Secondary | ICD-10-CM | POA: Insufficient documentation

## 2013-03-04 DIAGNOSIS — K5900 Constipation, unspecified: Secondary | ICD-10-CM

## 2013-03-04 DIAGNOSIS — Z8659 Personal history of other mental and behavioral disorders: Secondary | ICD-10-CM | POA: Insufficient documentation

## 2013-03-04 DIAGNOSIS — K219 Gastro-esophageal reflux disease without esophagitis: Secondary | ICD-10-CM | POA: Insufficient documentation

## 2013-03-04 DIAGNOSIS — K429 Umbilical hernia without obstruction or gangrene: Secondary | ICD-10-CM

## 2013-03-04 DIAGNOSIS — Z7982 Long term (current) use of aspirin: Secondary | ICD-10-CM | POA: Insufficient documentation

## 2013-03-04 DIAGNOSIS — Z85828 Personal history of other malignant neoplasm of skin: Secondary | ICD-10-CM | POA: Insufficient documentation

## 2013-03-04 DIAGNOSIS — M129 Arthropathy, unspecified: Secondary | ICD-10-CM | POA: Insufficient documentation

## 2013-03-04 DIAGNOSIS — F988 Other specified behavioral and emotional disorders with onset usually occurring in childhood and adolescence: Secondary | ICD-10-CM | POA: Insufficient documentation

## 2013-03-04 DIAGNOSIS — G8918 Other acute postprocedural pain: Secondary | ICD-10-CM | POA: Insufficient documentation

## 2013-03-04 DIAGNOSIS — L719 Rosacea, unspecified: Secondary | ICD-10-CM | POA: Insufficient documentation

## 2013-03-04 DIAGNOSIS — Z87448 Personal history of other diseases of urinary system: Secondary | ICD-10-CM | POA: Insufficient documentation

## 2013-03-04 MED ORDER — SORBITOL 70 % SOLN
960.0000 mL | TOPICAL_OIL | Freq: Once | ORAL | Status: AC
  Administered 2013-03-04: 960 mL via RECTAL
  Filled 2013-03-04: qty 240

## 2013-03-04 NOTE — ED Provider Notes (Signed)
CSN: [id number]     Arrival date & time 03/04/13  7829 History   None    Chief Complaint  Patient presents with  . Abdominal Pain   (Consider location/radiation/quality/duration/timing/severity/associated sxs/prior Treatment) The history is provided by the patient and medical records.   Pt presents to the ED for abdominal pain.  Pt recently underwent hernia repair on 03/01/13-- procedure was without complication.  Pt states he has been doing well until approx 2am this morning and he had a sudden onset of severe pain in his lower abdomen.  Pt states there is some cramping and he feels extremely bloated with a pressure sensation surrounding his rectum.  Pt has not had a BM since surgery but is freely passing gas.  No nausea or vomiting.  He has been taking the narcotic pain medication but has been using stool softener as directed.  Took MiraLax at midnight but still has not had a bowel movement.  No chest pain or SOB.  Past Medical History  Diagnosis Date  . IBS (irritable bowel syndrome)   . Rosacea   . Depression   . ADD (attention deficit disorder)   . Esophageal reflux   . BPH (benign prostatic hypertrophy)   . OSA (obstructive sleep apnea)   . Arthritis   . Cancer     basal skin    Past Surgical History  Procedure Laterality Date  . Left inguinal hernia repair    . Skin cancer removed    . Multiple orthopedic surgery    . Mouth surgery      teeth removal/implants  . Jaw implants    . Cataract extraction    . Tonsillectomy    . Left clavical    . Right knee arthroscopy    . Planter fascia    . Left foot neuroma    . Right shoulder    . Hernia repair      lingual   Family History  Problem Relation Age of Onset  . Heart attack Father 32  . Heart disease Father   . Lung disease Mother 57  . Diabetes Brother    History  Substance Use Topics  . Smoking status: Never Smoker   . Smokeless tobacco: Never Used  . Alcohol Use: Yes    Review of Systems   Gastrointestinal: Positive for abdominal pain, constipation and abdominal distention.  All other systems reviewed and are negative.    Allergies  Review of patient's allergies indicates no known allergies.  Home Medications   Current Outpatient Rx  Name  Route  Sig  Dispense  Refill  . amphetamine-dextroamphetamine (ADDERALL XR) 30 MG 24 hr capsule   Oral   Take 30 mg by mouth daily.         Marland Kitchen aspirin 81 MG tablet   Oral   Take 81 mg by mouth daily.         . cetirizine (ZYRTEC) 10 MG tablet   Oral   Take 10 mg by mouth daily.         . cholecalciferol (VITAMIN D) 1000 UNITS tablet   Oral   Take 1,000 Units by mouth daily.         . fish oil-omega-3 fatty acids 1000 MG capsule   Oral   Take 1 capsule by mouth 2 (two) times daily.         . Glucosamine-Chondroit-Vit C-Mn (GLUCOSAMINE 1500 COMPLEX PO)   Oral   Take 2 capsules by mouth daily.         Marland Kitchen  HYDROcodone-acetaminophen (NORCO) 5-325 MG per tablet   Oral   Take 1-2 tablets by mouth every 4 (four) hours as needed for pain (Given at discharge from [facility]).   30 tablet   0   . ibuprofen (ADVIL,MOTRIN) 200 MG tablet   Oral   Take 200 mg by mouth every 6 (six) hours as needed.         . metroNIDAZOLE (METROCREAM) 0.75 % cream               . minocycline (MINOCIN,DYNACIN) 100 MG capsule               . Multiple Vitamin (MULTIVITAMIN) tablet   Oral   Take 1 tablet by mouth daily.         Marland Kitchen omeprazole (PRILOSEC) 40 MG capsule   Oral   Take 40 mg by mouth daily.         . sildenafil (VIAGRA) 100 MG tablet      1/4 tablet as needed         . simvastatin (ZOCOR) 10 MG tablet   Oral   Take 10 mg by mouth daily.         Marland Kitchen tretinoin (RETIN-A) 0.025 % cream               . Vilazodone HCl (VIIBRYD) 10 MG TABS      1 tablet every other day          BP 138/91  Pulse 60  Temp(Src) 97.7 F (36.5 C) (Oral)  Resp 19  Ht 5\' 10"  (1.778 m)  Wt 185 lb (83.915 kg)  BMI 26.54  kg/m2  SpO2 97%  Physical Exam  Nursing note and vitals reviewed. Constitutional: He is oriented to person, place, and time. He appears well-developed and well-nourished. No distress.  HENT:  Head: Normocephalic and atraumatic.  Mouth/Throat: Oropharynx is clear and moist.  Eyes: Conjunctivae and EOM are normal. Pupils are equal, round, and reactive to light.  Neck: Normal range of motion.  Cardiovascular: Normal rate, regular rhythm and normal heart sounds.   Pulmonary/Chest: Effort normal and breath sounds normal. No respiratory distress. He has no wheezes.  Abdominal: Soft. Bowel sounds are normal. He exhibits distension. There is tenderness.  Abdomen appears mildly distended, TTP of lower abdomen; umbilical surgical incision healing well; some surrounding erythema but no drainage, induration, or signs of infection  Genitourinary: Rectum normal. Rectal exam shows no external hemorrhoid. Guaiac negative stool.  Fecal impaction present  Musculoskeletal: Normal range of motion.  Neurological: He is alert and oriented to person, place, and time.  Skin: Skin is warm and dry. He is not diaphoretic.  Psychiatric: He has a normal mood and affect.    ED Course  Procedures (including critical care time) Labs Review Labs Reviewed - No data to display Imaging Review Dg Abd 1 View  03/04/2013   CLINICAL DATA:  Post hernia surgery. Distention and pain.  EXAM: ABDOMEN - 1 VIEW  COMPARISON:  None.  FINDINGS: Gas and stool are present throughout the colon with mild distention. There is moderate stool at the rectosigmoid colon. No free air is evident. Degenerative changes are noted in the lower lumbar spine.  IMPRESSION: 1. Mild distention of stool and gas-filled colon without evidence for obstruction or free air.   Electronically Signed   By: Gennette Pac M.D.   On: 03/04/2013 07:34    MDM   1. Abdominal  pain, other specified site   2. Constipation  7:43 AM X-ray with moderate stool  burden but no signs of free air or bowel obstruction.  Pt will be given SMOG enema.  Will continue to monitor.  Pt given enema and has had several large bowel movements.  States he no longer feels bloated and pressure sensation has resolved. States he no longer has the intense pain that he was experiencing on arrival, but has some "soreness" surrounding his surgical incision site.  Repeat abdominal exam improved, some soreness as expected following surgery.  Pt afebrile, non-toxic appearing, NAD, VS stable- ok for discharge.  Pt will be discharged and instructed to FU with surgeon as previously scheduled.  FU with PCP if problems occur before then.  I have advised him to continue taking stool softener/laxative until BMs regulate.  Discussed plan with pt, he agreed.  Return precautions advised.  Discussed with Dr. Hyacinth Meeker who agrees with plan of care.  Garlon Hatchet, PA-C 03/04/13 1243  Garlon Hatchet, PA-C 03/04/13 1247

## 2013-03-04 NOTE — Telephone Encounter (Signed)
She called 0430 about Mr. Tanner [name].  He had an umbilical hernia repair done 3 days ago.  He took Miralax because of constipation and woke up with severe crampy lower abdominal pain.  He has not had much of a BM.  I told him to try a Dulcolax suppository and if he continued to have the pain he should go to the ED and be evaluated.

## 2013-03-04 NOTE — Patient Instructions (Signed)
You appear to have a low-grade cellulitis of the abdominal wall. This is most likely a staph or strep organism. The hernia repair is intact and there is no abscess or drainage.  You will be placed on an antibiotic called doxycycline for 14 days.  Return to see Dr. Gerrit Friends or one of his partners next week to be sure this is getting better.

## 2013-03-04 NOTE — Progress Notes (Addendum)
Patient ID: Tanner [name], male   DOB: 07-06-1945, 67 y.o.   MRN: [id number] History: This patient underwent umbilical hernia repair with mesh by Dr. Gerrit Friends on  September 30. Last night he developed severe abdominal pain and noticed redness of his abdominal wall. He went to the emergency room. He was given an enema and had a good bowel movement and the pain has completely resolved. He still has erythema and the emergency room staff referred him to the office today. He denies fever or chills or drainage.  Exam:  Patient is alert. No distress. Does not look toxic. Tip 97.6. Heart rate 68 Abdomen fresh incision infraumbilical. I removed the Steri-Strips. No skin necrosis or drainage. No fluid collection. There is erythema 20 cm transversely by 12 cm vertically. This starts at the level of the umbilicus and then superiorly. It is not indurated. Does blanch. Slightly warm. The abdomen itself is nontender.  Assessment: Cellulitis of his abdominal wall, early postop period,  suspect staph or strep Hernia repair intact, POD #3  Plan: No indication for surgical exploration or debridement at this time Doxycycline 100 mg twice a day x14 days I marked the limits of the erythema with a marking pen. They're instructed to call if this continues to progress Return to see Dr. Gerrit Friends in 3-4 days.   Tanner [name]. Derrell Lolling, M.D., [facility] General and Minimally invasive Surgery Breast and Colorectal Surgery Office:   [phone] Pager:   [phone]

## 2013-03-04 NOTE — ED Notes (Signed)
Patient presents to ED via [facility]. Pt had hernia repair surgery on Tuesday, pt states that recovery was going well until approx 2 am this when the pain woke him from his sleep. Pt c/o of "severe abdominal pain and intermittent cramping." Pt also states that he feels "bloated." Pt has not had a bowel movement since the morning before the surgery. Bandage still applied to surgical site, slight redness noted around bandage. Upon arrival to ED pt A&Ox4.

## 2013-03-04 NOTE — ED Notes (Signed)
Patient transported to X-ray 

## 2013-03-07 ENCOUNTER — Ambulatory Visit (INDEPENDENT_AMBULATORY_CARE_PROVIDER_SITE_OTHER): Payer: Medicare Other | Admitting: Surgery

## 2013-03-07 ENCOUNTER — Encounter (INDEPENDENT_AMBULATORY_CARE_PROVIDER_SITE_OTHER): Payer: Self-pay | Admitting: Surgery

## 2013-03-07 VITALS — BP 98/58 | HR 76 | Temp 97.8°F | Resp 14 | Ht 70.0 in | Wt 179.4 lb

## 2013-03-07 DIAGNOSIS — Z09 Encounter for follow-up examination after completed treatment for conditions other than malignant neoplasm: Secondary | ICD-10-CM

## 2013-03-07 NOTE — Progress Notes (Signed)
Subjective:     Patient ID: Tanner [name], male   DOB: 1946/01/06, 67 y.o.   MRN: [id number]  HPI He is here for a followup visit from Friday where he had a moderate amount of cellulitis of the abdominal wall after a local hernia repair with mesh. Since starting antibiotics he reports he is improved greatly  Review of Systems     Objective:   Physical Exam The erythema has totally resolved and he looks well    Assessment:     Resulting postop cellulitis     Plan:     He will continue the antibiotics until they're complete and will followup with Dr. Gerrit Friends at his regular postoperative

## 2013-03-10 NOTE — ED Provider Notes (Signed)
Medical screening examination/treatment/procedure(s) were performed by non-physician practitioner and as supervising physician I was immediately available for consultation/collaboration.  Olivia Mackie, MD 03/10/13 [phone]

## 2013-03-22 ENCOUNTER — Encounter (INDEPENDENT_AMBULATORY_CARE_PROVIDER_SITE_OTHER): Payer: Self-pay | Admitting: Surgery

## 2013-03-22 ENCOUNTER — Ambulatory Visit (INDEPENDENT_AMBULATORY_CARE_PROVIDER_SITE_OTHER): Payer: Medicare Other | Admitting: Surgery

## 2013-03-22 VITALS — BP 120/64 | HR 76 | Temp 97.5°F | Resp 14 | Ht 70.0 in | Wt 189.2 lb

## 2013-03-22 DIAGNOSIS — K429 Umbilical hernia without obstruction or gangrene: Secondary | ICD-10-CM

## 2013-03-22 NOTE — Patient Instructions (Signed)
  COCOA BUTTER & VITAMIN E CREAM  ([employer] or other brand)  Apply cocoa butter/vitamin E cream to your incision 2 - 3 times daily.  Massage cream into incision for one minute with each application.  Use sunscreen (50 SPF or higher) for first 6 months after surgery if area is exposed to sun.  You may substitute Mederma or other scar reducing creams as desired.   

## 2013-03-22 NOTE — Progress Notes (Signed)
General Surgery [facility]  Chief Complaint  Patient presents with  . Routine Post Op    umbilical hernia repair 03/01/2013    HISTORY: Patient is a 67 year old male who underwent umbilical hernia repair with mesh patch on 03/01/2013. Postoperative course was complicated by constipation which he attributes to the narcotic pain medicine. He required evaluation in the emergency department. He was also noted to have erythema around the site of the surgical incision and was placed on oral antibiotics for 2 weeks. He returns today for wound check.  EXAM: Surgical incision appears well healed. With Valsalva there is no sign of recurrent hernia. There is no seroma. There is no erythema. There is no drainage.  IMPRESSION: Status post umbilical hernia repair with mesh patch  PLAN: Patient will begin applying topical creams to his incisions. He is released to aerobic activity and lifting up to 25 pounds.  Patient will return for wound check in 6 weeks.  Velora Heckler, MD, FACS General & Endocrine Surgery [facility]   Visit Diagnoses: 1. Umbilical hernia, reducible

## 2013-03-31 ENCOUNTER — Ambulatory Visit (INDEPENDENT_AMBULATORY_CARE_PROVIDER_SITE_OTHER): Payer: Medicare Other | Admitting: Surgery

## 2013-03-31 ENCOUNTER — Encounter (INDEPENDENT_AMBULATORY_CARE_PROVIDER_SITE_OTHER): Payer: Self-pay | Admitting: Surgery

## 2013-03-31 VITALS — BP 110/62 | HR 76 | Temp 98.9°F | Resp 15 | Ht 70.5 in | Wt 188.4 lb

## 2013-03-31 DIAGNOSIS — K429 Umbilical hernia without obstruction or gangrene: Secondary | ICD-10-CM

## 2013-03-31 NOTE — Progress Notes (Signed)
[facility]  Ovidio Kin, MD,  FACS [address] [place]  [address] [place], [place]    [address] Phone:  [phone] FAX:  [phone]   Re:   Tanner [name] DOB:   December 15, 1945 MRN:   [id number]  Urgent Office  ASSESSMENT AND PLAN: 1.  S/p umbilical hernia repair (at [facility]) wit Bard Ventralex 4.3 cm patch - T. Gerkin  The wound looks okay to me.  I will move his appointment up with Dr. Gerrit Friends.   2.  OSA   HISTORY OF PRESENT ILLNESS: Chief Complaint  Patient presents with  . Follow-up    rednes and tenderness increasing at inc site    Tanner [name] is a 67 y.o. (DOB: 17-Apr-1946)  white  male who is a patient of KOIRALA,DIBAS, MD and comes to the Urgent Office for abdominal pain post umbilical hernia repair. It appears early he had a cellulitis treated by Dr. Derrell Lolling with doxycycline.  But this is improved. Now he has a vague abdominal pain around the umbilicus, he thinks that his umbilicus is red.  And he was worried about these finding.  Past Medical History  Diagnosis Date  . IBS (irritable bowel syndrome)   . Rosacea   . Depression   . ADD (attention deficit disorder)   . Esophageal reflux   . BPH (benign prostatic hypertrophy)   . OSA (obstructive sleep apnea)   . Arthritis   . Cancer     basal skin     SOCIAL HISTORY: Wife with the patient.  PHYSICAL EXAM: BP 110/62  Pulse 76  Temp(Src) 98.9 F (37.2 C) (Temporal)  Resp 15  Ht 5' 10.5" (1.791 m)  Wt 188 lb 6.4 oz (85.458 kg)  BMI 26.64 kg/m2  Abdomen: Wound looks okay, except for central scab.  No evidence of infection.     DATA REVIEWED: Epic data   Ovidio Kin, MD,  [facility] [place] [address]  [address]   [place], [place]    [address] Phone:  [phone] FAX:  [phone]

## 2013-04-11 ENCOUNTER — Encounter (INDEPENDENT_AMBULATORY_CARE_PROVIDER_SITE_OTHER): Payer: Self-pay | Admitting: Surgery

## 2013-04-11 ENCOUNTER — Ambulatory Visit (INDEPENDENT_AMBULATORY_CARE_PROVIDER_SITE_OTHER): Payer: Medicare Other | Admitting: Surgery

## 2013-04-11 VITALS — BP 112/70 | HR 68 | Resp 16 | Ht 70.5 in | Wt 185.8 lb

## 2013-04-11 DIAGNOSIS — K429 Umbilical hernia without obstruction or gangrene: Secondary | ICD-10-CM

## 2013-04-11 DIAGNOSIS — Z5189 Encounter for other specified aftercare: Secondary | ICD-10-CM

## 2013-04-11 DIAGNOSIS — IMO0001 Reserved for inherently not codable concepts without codable children: Secondary | ICD-10-CM

## 2013-04-11 DIAGNOSIS — T8149XA Infection following a procedure, other surgical site, initial encounter: Secondary | ICD-10-CM | POA: Insufficient documentation

## 2013-04-11 NOTE — Progress Notes (Signed)
General Surgery [facility]  Chief Complaint  Patient presents with  . Routine Post Op    umbilical hernia repair 03/01/2013    HISTORY: Patient is a 67 year old male who underwent umbilical hernia repair with mesh patch on 03/01/2013. Postoperative course was complicated by development of superficial cellulitis. He was treated with oral antibiotics with resolution. He returns today for wound check.  Patient complains of pain in the peri-umbilical region. This is worse with physical activity.  EXAM: Surgical wound is epithelialized. There is no erythema. There is no drainage. There is induration beneath the incision and around the umbilicus. With Valsalva there is no sign of recurrent hernia. There is no fluctuance. Remainder of the abdomen is soft and nontender without distention.  IMPRESSION: #1 status post umbilical hernia repair with mesh patch #2 postoperative superficial cellulitis, resolved after antibiotic therapy  PLAN: Patient will begin applying topical creams to his incision. I have given him a prescription for an abdominal binder to wear for comfort. He will return in 3 weeks for final wound check.  Velora Heckler, MD, FACS General & Endocrine Surgery [facility]   Visit Diagnoses: 1. Umbilical hernia, reducible   2. Postoperative wound infection, subsequent encounter

## 2013-04-11 NOTE — Patient Instructions (Signed)
  COCOA BUTTER & VITAMIN E CREAM  ([employer] or other brand)  Apply cocoa butter/vitamin E cream to your incision 2 - 3 times daily.  Massage cream into incision for one minute with each application.  Use sunscreen (50 SPF or higher) for first 6 months after surgery if area is exposed to sun.  You may substitute Mederma or other scar reducing creams as desired.   

## 2013-04-15 ENCOUNTER — Other Ambulatory Visit: Payer: Self-pay | Admitting: Family Medicine

## 2013-04-15 ENCOUNTER — Ambulatory Visit
Admission: RE | Admit: 2013-04-15 | Discharge: 2013-04-15 | Disposition: A | Discharge status: Home / Self Care | Payer: Medicare Other | Source: Ambulatory Visit | Attending: Family Medicine | Admitting: Family Medicine

## 2013-04-15 DIAGNOSIS — Z01818 Encounter for other preprocedural examination: Secondary | ICD-10-CM

## 2013-04-15 DIAGNOSIS — Z8669 Personal history of other diseases of the nervous system and sense organs: Secondary | ICD-10-CM

## 2013-04-26 ENCOUNTER — Other Ambulatory Visit: Payer: Self-pay | Admitting: Orthopedic Surgery

## 2013-04-26 NOTE — Progress Notes (Signed)
Preoperative surgical orders have been place into the Epic hospital system for Tanner [name] on 04/26/2013, 1:01 PM  by Patrica Duel for surgery on 05/11/2013.  Preop Total Hip - Anterior Approach orders including Experel Injecion, PO Tylenol, and IV Decadron as long as there are no contraindications to the above medications. Avel Peace, PA-C

## 2013-04-27 ENCOUNTER — Encounter (INDEPENDENT_AMBULATORY_CARE_PROVIDER_SITE_OTHER): Payer: Medicare Other | Admitting: Surgery

## 2013-04-27 NOTE — Patient Instructions (Addendum)
20      Your procedure is scheduled on:  Wednesday 05/11/2013  Report to [facility] at  0830 AM.  Call this number if you have problems the night before or morning of surgery:  [phone]   Remember:             IF YOU USE CPAP,BRING MASK AND TUBING AM OF SURGERY!             IF YOU DO NOT HAVE YOUR TYPE AND SCREEN DRAWN AT PRE-ADMIT APPOINTMENT, YOU WILL HAVE IT DRAWN AM OF SURGERY!   Do not eat food or drink liquids AFTER MIDNIGHT!  Take these medicines the morning of surgery with A SIP OF WATER: Omeprazole    [facility] IS NOT RESPONSIBLE FOR ANY BELONGINGS OR VALUABLES BROUGHT TO HOSPITAL.  Marland Kitchen  Leave suitcase in the car. After surgery it may be brought to your room.  For patients admitted to the hospital, checkout time is 11:00 AM the day of              Discharge.    DO NOT WEAR JEWELRY,MAKE-UP,LOTIONS,POWDERS,PERFUMES,CONTACTS , DENTURES OR BRIDGEWORK ,AND DO NOT WEAR FALSE EYELASHES                                    Patients discharged the day of surgery will not be allowed to drive home.If going home the same day of surgery, must have someone stay with you first 24 hrs.at home and arrange for someone to drive you home from the Hospital.                          YOUR DRIVER IS:N/A   Special Instructions:              Please read over the following fact sheets that you were given:             1. [place] [facility] PREPARING FOR SURGERY SHEET              2.INCENTIVE SPIROMETRY                                        [place].Darshay Deupree,RN,BSN     [phone]                FAILURE TO FOLLOW THESE INSTRUCTIONS MAY RESULT IN CANCELLATION OF YOUR SURGERY!               Patient Signature:___________________________

## 2013-05-02 ENCOUNTER — Encounter (HOSPITAL_COMMUNITY): Payer: Self-pay | Admitting: Pharmacy Technician

## 2013-05-03 ENCOUNTER — Encounter (INDEPENDENT_AMBULATORY_CARE_PROVIDER_SITE_OTHER): Payer: Self-pay | Admitting: Surgery

## 2013-05-03 ENCOUNTER — Ambulatory Visit (HOSPITAL_COMMUNITY)
Admission: RE | Admit: 2013-05-03 | Discharge: 2013-05-03 | Disposition: A | Discharge status: Home / Self Care | Payer: Medicare Other | Source: Ambulatory Visit | Attending: Orthopedic Surgery | Admitting: Orthopedic Surgery

## 2013-05-03 ENCOUNTER — Encounter (HOSPITAL_COMMUNITY)
Admission: RE | Admit: 2013-05-03 | Discharge: 2013-05-03 | Disposition: A | Discharge status: Home / Self Care | Payer: Medicare Other | Source: Ambulatory Visit | Attending: Orthopedic Surgery | Admitting: Orthopedic Surgery

## 2013-05-03 ENCOUNTER — Encounter (HOSPITAL_COMMUNITY): Payer: Self-pay

## 2013-05-03 ENCOUNTER — Ambulatory Visit (INDEPENDENT_AMBULATORY_CARE_PROVIDER_SITE_OTHER): Payer: Medicare Other | Admitting: Surgery

## 2013-05-03 VITALS — BP 142/78 | HR 76 | Temp 96.9°F | Resp 20 | Ht 70.5 in | Wt 185.0 lb

## 2013-05-03 DIAGNOSIS — M87059 Idiopathic aseptic necrosis of unspecified femur: Secondary | ICD-10-CM | POA: Insufficient documentation

## 2013-05-03 DIAGNOSIS — M47817 Spondylosis without myelopathy or radiculopathy, lumbosacral region: Secondary | ICD-10-CM | POA: Insufficient documentation

## 2013-05-03 DIAGNOSIS — Z01812 Encounter for preprocedural laboratory examination: Secondary | ICD-10-CM | POA: Insufficient documentation

## 2013-05-03 DIAGNOSIS — IMO0001 Reserved for inherently not codable concepts without codable children: Secondary | ICD-10-CM

## 2013-05-03 DIAGNOSIS — K429 Umbilical hernia without obstruction or gangrene: Secondary | ICD-10-CM

## 2013-05-03 DIAGNOSIS — Z5189 Encounter for other specified aftercare: Secondary | ICD-10-CM

## 2013-05-03 LAB — CBC
HCT: 46.6 % (ref 39.0–52.0)
Hemoglobin: 15.6 g/dL (ref 13.0–17.0)
MCH: 32.4 pg (ref 26.0–34.0)
MCHC: 33.5 g/dL (ref 30.0–36.0)
MCV: 96.9 fL (ref 78.0–100.0)
RBC: 4.81 MIL/uL (ref 4.22–5.81)

## 2013-05-03 LAB — COMPREHENSIVE METABOLIC PANEL
ALT: 23 U/L (ref 0–53)
Albumin: 3.8 g/dL (ref 3.5–5.2)
Alkaline Phosphatase: 54 U/L (ref 39–117)
BUN: 23 mg/dL (ref 6–23)
Calcium: 9.5 mg/dL (ref 8.4–10.5)
Creatinine, Ser: 1.23 mg/dL (ref 0.50–1.35)
GFR calc Af Amer: 68 mL/min — ABNORMAL LOW (ref >90)
Glucose, Bld: 90 mg/dL (ref 70–99)
Potassium: 4.7 mEq/L (ref 3.5–5.1)
Sodium: 139 mEq/L (ref 135–145)
Total Protein: 7 g/dL (ref 6.0–8.3)

## 2013-05-03 LAB — URINALYSIS, ROUTINE W REFLEX MICROSCOPIC
Glucose, UA: NEGATIVE mg/dL
Hgb urine dipstick: NEGATIVE
Ketones, ur: NEGATIVE mg/dL
Protein, ur: NEGATIVE mg/dL
Urobilinogen, UA: 0.2 mg/dL (ref 0.0–1.0)

## 2013-05-03 LAB — PROTIME-INR
INR: 0.89 (ref 0.00–1.49)
Prothrombin Time: 11.9 seconds (ref 11.6–15.2)

## 2013-05-03 LAB — APTT: aPTT: 29 seconds (ref 24–37)

## 2013-05-03 LAB — SURGICAL PCR SCREEN
MRSA, PCR: NEGATIVE
Staphylococcus aureus: NEGATIVE

## 2013-05-03 NOTE — Patient Instructions (Signed)
  COCOA BUTTER & VITAMIN E CREAM  ([employer] or other brand)  Apply cocoa butter/vitamin E cream to your incision 2 - 3 times daily.  Massage cream into incision for one minute with each application.  Use sunscreen (50 SPF or higher) for first 6 months after surgery if area is exposed to sun.  You may substitute Mederma or other scar reducing creams as desired.   

## 2013-05-03 NOTE — Progress Notes (Signed)
General Surgery [facility]  Chief Complaint  Patient presents with  . Routine Post Op    umbilical hernia repair    HISTORY: Patient is a 67 year old male who underwent umbilical hernia repair with mesh patch. Postoperative course was complicated by a superficial cellulitis which was treated with antibiotics and has resolved. He returns today for a final wound check.  EXAM: Surgical incision is completely epithelialized. There is no sign of infection. With Valsalva there is no sign of recurrence.  IMPRESSION: Status post umbilical hernia repair with mesh patch.  PLAN: Patient is released to full activity without restriction. He will continue to apply topical creams to his incision.  Patient will return for surgical care as needed.  Velora Heckler, MD, FACS General & Endocrine Surgery [facility]   Visit Diagnoses: 1. Umbilical hernia, reducible   2. Postoperative wound infection, subsequent encounter

## 2013-05-06 ENCOUNTER — Other Ambulatory Visit: Payer: Self-pay | Admitting: Orthopedic Surgery

## 2013-05-06 NOTE — H&P (Signed)
Tanner [name] Tanner [name]  DOB: 1946/01/01 Widowed / Language: [place] / Race: White Male  Date of Admission:  05-11-2013  Chief Complaint:  Right Hip Pain  History of Present Illness The patient is a 67 year old male who comes in for a preoperative History and Physical. The patient is scheduled for a right total hip arthroplasty (anterior approach) to be performed by Dr. Gus Rankin. [name], MD at [facility] on 05/11/2013. The patient is a 67 year old male who presents with a hip problem. The patient reports left hip and right hip (worse) problems including pain symptoms that have been present for 7 month(s). The symptoms began without any known injury. Symptoms reported include hip pain, weakness and numbness (in thigh) The patient reports symptoms radiating to the: right groin and right thigh. Symptoms are exacerbated by walking. He has been having trouble since January. He also has pain in the left groin but not as significant. He had an MRI of the hips in May which showed AVN in both hips, more progressed in the right than left. He is prepared for a total hip replacement, right before left. He does have hernias that he has yet to have evaluated and does need to have those checked before moving forward with surgery. He still has a considerable pain in the right hip, but he does not have constant pain. He will get intense flashes of pain and then have a lower baseline level. It is starting to limit what he can and cannot do. He still tries to work out and does have avid exercise, but it is getting more difficult to do so. He has necrosis of the hip. He is ready to proceed with hip surgery. They have been treated conservatively in the past for the above stated problem and despite conservative measures, they continue to have progressive pain and severe functional limitations and dysfunction. They have failed non-operative management including home exercise, medications. It is felt that they  would benefit from undergoing total joint replacement. Risks and benefits of the procedure have been discussed with the patient and they elect to proceed with surgery. There are no active contraindications to surgery such as ongoing infection or rapidly progressive neurological disease.    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 Constipation    Family History Congestive Heart Failure. Father. father Kidney disease. father Hypertension. mother Osteoarthritis. mother and grandmother mothers side Heart disease in male family member before age 58 Cerebrovascular Accident. grandfather mothers side Cancer. grandmother fathers side Depression. mother Heart Disease. father, grandfather mothers side and grandfather fathers side Drug / Alcohol Addiction. father, grandfather mothers side and grandfather fathers side    Social History Marital status. widowed Living situation. live alone Pain Contract. no Tobacco use. Never smoker. never smoker Tobacco / smoke exposure. yes outdoors only Illicit drug use. no Children. 2 Alcohol use. current drinker; drinks beer, wine and hard liquor; 5-7 per week Copy of Drug/Alcohol Rehab (Previously). no Drug/Alcohol Rehab (Currently). no Current work status. retired Museum/gallery exhibitions officer. Home with friend Dondra Spry)    Medication History Adderall ( Oral) Specific dose unknown - Active. Simvastatin (10MG  Tablet, Oral) Active. Minocycline HCl ( Oral) Specific dose unknown - Active. MetroNIDAZOLE (0.75% Cream, External) Active. Omeprazole (20MG  Capsule DR, Oral) Active. Aspirin (81MG  Tablet, 1 (one) Oral) Active. Omega 3 ( Oral) Specific dose unknown - Active. Vitamin D (1 (one) Oral) Specific dose unknown - Active. Viibryd (20MG  Tablet, Oral)  Active.    Past Surgical History Inguinal Hernia Repair. open: left 06/1999 Umbilical Hernia Repair  01/2013 Foot Surgery. bilateral (neuromas) 1995 Tonsillectomy 1953 Rotator Cuff Repair. right 2008 Cataract Surgery. right Oct 2011 Arthroscopy of Shoulder. right Arthroscopy of Knee. right Jaw Implants 2010 Left clavicle 1980 Varicose Vein Removal 05/2012 and 06/2012   Medical History Sinus Allergies Irritable Bowel Syndrome Hypercholesterolemia Gastroesophageal Reflux Disease Depression Osteoarthritis Migraine Headache Irritable bowel syndrome Anxiety Disorder Obstructive Sleep Apnea Skin Cancer Rosacea Postop Constipation (severe following Umbilical Hernia surgery)   Review of Systems General:Not Present- Chills, Fever, Night Sweats, Fatigue, Weight Gain, Weight Loss and Memory Loss. Skin:Not Present- Hives, Itching, Rash, Eczema and Lesions. HEENT:Not Present- Tinnitus, Headache, Double Vision, Visual Loss, Hearing Loss and Dentures. Respiratory:Not Present- Shortness of breath with exertion, Shortness of breath at rest, Allergies, Coughing up blood and Chronic Cough. Cardiovascular:Not Present- Chest Pain, Racing/skipping heartbeats, Difficulty Breathing Lying Down, Murmur, Swelling and Palpitations. Gastrointestinal:Not Present- Bloody Stool, Heartburn, Abdominal Pain, Vomiting, Nausea, Constipation, Diarrhea, Difficulty Swallowing, Jaundice and Loss of appetitie. Male Genitourinary:Not Present- Urinary frequency, Blood in Urine, Weak urinary stream, Discharge, Flank Pain, Incontinence, Painful Urination, Urgency, Urinary Retention and Urinating at Night. Musculoskeletal:Not Present- Muscle Weakness, Muscle Pain, Joint Swelling, Joint Pain, Back Pain, Morning Stiffness and Spasms. Neurological:Not Present- Tremor, Dizziness, Blackout spells, Paralysis, Difficulty with balance and Weakness. Psychiatric:Not Present- Insomnia.    Vitals Weight: 182 lb Height: 70.5 in Weight was reported by patient. Height was reported by patient. Body Surface  Area: 2.03 m Body Mass Index: 25.74 kg/m Pulse: 56 (Regular) Resp.: 14 (Unlabored) BP: 116/58 (Sitting, Left Arm, Standard)     Physical Exam The physical exam findings are as follows:   General Mental Status - Alert, cooperative and good historian. General Appearance- pleasant. Not in acute distress. Orientation- Oriented X3. Build & Nutrition- Well nourished and Well developed.   Head and Neck Head- normocephalic, atraumatic . Neck Global Assessment- supple. no bruit auscultated on the right and no bruit auscultated on the left.   Eye Pupil- Bilateral- Regular and Round. Motion- Bilateral- EOMI.   Chest and Lung Exam Auscultation: Breath sounds:- clear at anterior chest wall and - clear at posterior chest wall. Adventitious sounds:- No Adventitious sounds.   Cardiovascular Auscultation:Rhythm- Regular rate and rhythm. Heart Sounds- S1 WNL and S2 WNL. Murmurs & Other Heart Sounds:Auscultation of the heart reveals - No Murmurs.   Abdomen Palpation/Percussion:Tenderness- Abdomen is non-tender to palpation. Rigidity (guarding)- Abdomen is soft. Auscultation:Auscultation of the abdomen reveals - Bowel sounds normal.   Male Genitourinary Not done, not pertinent to present illness  Musculoskeletal Well developed male, alert and oriented in no apparent distress. He is in excellent physical condition. He appears younger than his stated age. His right hip can be flexed to about 90, about 10 degrees of internal rotation, 20 degrees of external rotation, 20 degrees abduction. He has a normal right knee examination. Pulse, sensation and motor intact. Left hip exam is normal.  RADIOGRAPHS: Plain radiographs, AP pelvis and lateral of the right hip taken today show that on AP he had some subchondral sclerosis in the femoral head, but on the lateral he has an area where the femoral head is flattened. I reviewed his MRI from  approximately May of this year and he had nearly 100% involvement of his femoral head with edema and avascular necrosis. The left femoral head had a smaller area that was involved.   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 Lequita Halt.  Plan is to go home  PCP - Dr. Arnoldo Morale - Patient has been seen preoperatively and felt to be stable for surgery.  The patient does not have any contraindications and will receive TXA (tranexamic acid) prior to surgery.  Signed electronically by Lauraine Rinne, III PA-C

## 2013-05-11 ENCOUNTER — Encounter (HOSPITAL_COMMUNITY)
Admission: RE | Disposition: A | Discharge status: Home Health | Payer: Self-pay | Source: Ambulatory Visit | Attending: Orthopedic Surgery

## 2013-05-11 ENCOUNTER — Inpatient Hospital Stay (HOSPITAL_COMMUNITY): Payer: Medicare Other

## 2013-05-11 ENCOUNTER — Encounter (HOSPITAL_COMMUNITY): Payer: Medicare Other | Admitting: Certified Registered Nurse Anesthetist

## 2013-05-11 ENCOUNTER — Encounter (HOSPITAL_COMMUNITY): Payer: Self-pay | Admitting: *Deleted

## 2013-05-11 ENCOUNTER — Inpatient Hospital Stay (HOSPITAL_COMMUNITY)
Admission: RE | Admit: 2013-05-11 | Discharge: 2013-05-13 | DRG: 470 | Disposition: A | Discharge status: Home Health | Payer: Medicare Other | Source: Ambulatory Visit | Attending: Orthopedic Surgery | Admitting: Orthopedic Surgery

## 2013-05-11 ENCOUNTER — Inpatient Hospital Stay (HOSPITAL_COMMUNITY): Payer: Medicare Other | Admitting: Certified Registered Nurse Anesthetist

## 2013-05-11 DIAGNOSIS — Z79899 Other long term (current) drug therapy: Secondary | ICD-10-CM

## 2013-05-11 DIAGNOSIS — K219 Gastro-esophageal reflux disease without esophagitis: Secondary | ICD-10-CM | POA: Diagnosis present

## 2013-05-11 DIAGNOSIS — F3289 Other specified depressive episodes: Secondary | ICD-10-CM | POA: Diagnosis present

## 2013-05-11 DIAGNOSIS — F411 Generalized anxiety disorder: Secondary | ICD-10-CM | POA: Diagnosis present

## 2013-05-11 DIAGNOSIS — M87059 Idiopathic aseptic necrosis of unspecified femur: Principal | ICD-10-CM | POA: Diagnosis present

## 2013-05-11 DIAGNOSIS — Z96649 Presence of unspecified artificial hip joint: Secondary | ICD-10-CM

## 2013-05-11 DIAGNOSIS — Z823 Family history of stroke: Secondary | ICD-10-CM

## 2013-05-11 DIAGNOSIS — Z8249 Family history of ischemic heart disease and other diseases of the circulatory system: Secondary | ICD-10-CM

## 2013-05-11 DIAGNOSIS — E7800 Pure hypercholesterolemia, unspecified: Secondary | ICD-10-CM | POA: Diagnosis present

## 2013-05-11 DIAGNOSIS — G4733 Obstructive sleep apnea (adult) (pediatric): Secondary | ICD-10-CM | POA: Diagnosis present

## 2013-05-11 DIAGNOSIS — M169 Osteoarthritis of hip, unspecified: Secondary | ICD-10-CM | POA: Diagnosis present

## 2013-05-11 DIAGNOSIS — N400 Enlarged prostate without lower urinary tract symptoms: Secondary | ICD-10-CM | POA: Diagnosis present

## 2013-05-11 DIAGNOSIS — M87051 Idiopathic aseptic necrosis of right femur: Secondary | ICD-10-CM

## 2013-05-11 DIAGNOSIS — Z7982 Long term (current) use of aspirin: Secondary | ICD-10-CM

## 2013-05-11 DIAGNOSIS — G43909 Migraine, unspecified, not intractable, without status migrainosus: Secondary | ICD-10-CM | POA: Diagnosis present

## 2013-05-11 DIAGNOSIS — M1610 Unilateral primary osteoarthritis, unspecified hip: Secondary | ICD-10-CM | POA: Diagnosis present

## 2013-05-11 DIAGNOSIS — F988 Other specified behavioral and emotional disorders with onset usually occurring in childhood and adolescence: Secondary | ICD-10-CM | POA: Diagnosis present

## 2013-05-11 DIAGNOSIS — F329 Major depressive disorder, single episode, unspecified: Secondary | ICD-10-CM | POA: Diagnosis present

## 2013-05-11 HISTORY — PX: TOTAL HIP ARTHROPLASTY: SHX124

## 2013-05-11 LAB — TYPE AND SCREEN: ABO/RH(D): O NEG

## 2013-05-11 SURGERY — ARTHROPLASTY, HIP, TOTAL, ANTERIOR APPROACH
Anesthesia: General | Site: Hip | Laterality: Right

## 2013-05-11 MED ORDER — HYDROMORPHONE HCL PF 2 MG/ML IJ SOLN
INTRAMUSCULAR | Status: AC
  Filled 2013-05-11: qty 1

## 2013-05-11 MED ORDER — MORPHINE SULFATE 2 MG/ML IJ SOLN: 1.0000 mg | INTRAMUSCULAR | Status: DC | PRN

## 2013-05-11 MED ORDER — GLYCOPYRROLATE 0.2 MG/ML IJ SOLN
INTRAMUSCULAR | Status: AC
  Filled 2013-05-11: qty 3

## 2013-05-11 MED ORDER — POLYETHYLENE GLYCOL 3350 17 G PO PACK
17.0000 g | PACK | Freq: Every day | ORAL | Status: DC | PRN
  Administered 2013-05-13: 17 g via ORAL

## 2013-05-11 MED ORDER — FENTANYL CITRATE 0.05 MG/ML IJ SOLN
INTRAMUSCULAR | Status: DC | PRN
  Administered 2013-05-11: 100 ug via INTRAVENOUS

## 2013-05-11 MED ORDER — FLEET ENEMA 7-19 GM/118ML RE ENEM: 1.0000 | ENEMA | Freq: Once | RECTAL | Status: AC | PRN

## 2013-05-11 MED ORDER — LIDOCAINE HCL (CARDIAC) 20 MG/ML IV SOLN
INTRAVENOUS | Status: DC | PRN
  Administered 2013-05-11: 100 mg via INTRAVENOUS

## 2013-05-11 MED ORDER — ACETAMINOPHEN 10 MG/ML IV SOLN
1000.0000 mg | Freq: Once | INTRAVENOUS | Status: AC
  Administered 2013-05-11: 1000 mg via INTRAVENOUS
  Filled 2013-05-11: qty 100

## 2013-05-11 MED ORDER — PROPOFOL 10 MG/ML IV BOLUS
INTRAVENOUS | Status: DC | PRN
  Administered 2013-05-11: 200 mg via INTRAVENOUS

## 2013-05-11 MED ORDER — SODIUM CHLORIDE 0.9 % IJ SOLN
INTRAMUSCULAR | Status: DC | PRN
  Administered 2013-05-11: 30 mL via INTRAVENOUS

## 2013-05-11 MED ORDER — MIDAZOLAM HCL 5 MG/5ML IJ SOLN
INTRAMUSCULAR | Status: DC | PRN
  Administered 2013-05-11: 2 mg via INTRAVENOUS

## 2013-05-11 MED ORDER — ACETAMINOPHEN 500 MG PO TABS
1000.0000 mg | ORAL_TABLET | Freq: Four times a day (QID) | ORAL | Status: AC
  Administered 2013-05-11 – 2013-05-12 (×3): 1000 mg via ORAL
  Filled 2013-05-11 (×3): qty 2

## 2013-05-11 MED ORDER — TRANEXAMIC ACID 100 MG/ML IV SOLN
1000.0000 mg | INTRAVENOUS | Status: AC
  Administered 2013-05-11: 1000 mg via INTRAVENOUS
  Filled 2013-05-11: qty 10

## 2013-05-11 MED ORDER — DIPHENHYDRAMINE HCL 12.5 MG/5ML PO ELIX: 12.5000 mg | ORAL_SOLUTION | ORAL | Status: DC | PRN

## 2013-05-11 MED ORDER — METOCLOPRAMIDE HCL 5 MG/ML IJ SOLN: 5.0000 mg | Freq: Three times a day (TID) | INTRAMUSCULAR | Status: DC | PRN

## 2013-05-11 MED ORDER — ROCURONIUM BROMIDE 100 MG/10ML IV SOLN
INTRAVENOUS | Status: DC | PRN
  Administered 2013-05-11: 5 mg via INTRAVENOUS
  Administered 2013-05-11: 25 mg via INTRAVENOUS

## 2013-05-11 MED ORDER — TRAMADOL HCL 50 MG PO TABS: 50.0000 mg | ORAL_TABLET | Freq: Four times a day (QID) | ORAL | Status: DC | PRN

## 2013-05-11 MED ORDER — PHENOL 1.4 % MT LIQD
1.0000 | OROMUCOSAL | Status: DC | PRN
  Filled 2013-05-11: qty 177

## 2013-05-11 MED ORDER — ONDANSETRON HCL 4 MG/2ML IJ SOLN
INTRAMUSCULAR | Status: AC
  Filled 2013-05-11: qty 2

## 2013-05-11 MED ORDER — GLYCOPYRROLATE 0.2 MG/ML IJ SOLN
INTRAMUSCULAR | Status: DC | PRN
  Administered 2013-05-11: 0.6 mg via INTRAVENOUS

## 2013-05-11 MED ORDER — SODIUM CHLORIDE 0.9 % IJ SOLN
INTRAMUSCULAR | Status: AC
  Filled 2013-05-11: qty 10

## 2013-05-11 MED ORDER — FENTANYL CITRATE 0.05 MG/ML IJ SOLN
INTRAMUSCULAR | Status: AC
  Filled 2013-05-11: qty 2

## 2013-05-11 MED ORDER — DEXAMETHASONE SODIUM PHOSPHATE 10 MG/ML IJ SOLN
INTRAMUSCULAR | Status: AC
  Filled 2013-05-11: qty 1

## 2013-05-11 MED ORDER — VILAZODONE HCL 20 MG PO TABS
20.0000 mg | ORAL_TABLET | Freq: Every day | ORAL | Status: DC
  Administered 2013-05-12 – 2013-05-13 (×2): 20 mg via ORAL
  Filled 2013-05-11 (×3): qty 1

## 2013-05-11 MED ORDER — LORATADINE 10 MG PO TABS
10.0000 mg | ORAL_TABLET | Freq: Every day | ORAL | Status: DC
  Administered 2013-05-12 – 2013-05-13 (×2): 10 mg via ORAL
  Filled 2013-05-11 (×2): qty 1

## 2013-05-11 MED ORDER — CEFAZOLIN SODIUM-DEXTROSE 2-3 GM-% IV SOLR
INTRAVENOUS | Status: AC
  Filled 2013-05-11: qty 50

## 2013-05-11 MED ORDER — ONDANSETRON HCL 4 MG/2ML IJ SOLN: 4.0000 mg | Freq: Four times a day (QID) | INTRAMUSCULAR | Status: DC | PRN

## 2013-05-11 MED ORDER — BUPIVACAINE HCL 0.25 % IJ SOLN
INTRAMUSCULAR | Status: DC | PRN
  Administered 2013-05-11: 30 mL

## 2013-05-11 MED ORDER — BUPIVACAINE LIPOSOME 1.3 % IJ SUSP
INTRAMUSCULAR | Status: DC | PRN
  Administered 2013-05-11: 20 mL

## 2013-05-11 MED ORDER — DEXTROSE-NACL 5-0.9 % IV SOLN
INTRAVENOUS | Status: DC
  Administered 2013-05-11 (×2): via INTRAVENOUS

## 2013-05-11 MED ORDER — ACETAMINOPHEN 650 MG RE SUPP: 650.0000 mg | Freq: Four times a day (QID) | RECTAL | Status: DC | PRN

## 2013-05-11 MED ORDER — DEXAMETHASONE SODIUM PHOSPHATE 10 MG/ML IJ SOLN
10.0000 mg | Freq: Every day | INTRAMUSCULAR | Status: AC
  Filled 2013-05-11: qty 1

## 2013-05-11 MED ORDER — BUPIVACAINE LIPOSOME 1.3 % IJ SUSP
20.0000 mL | Freq: Once | INTRAMUSCULAR | Status: DC
  Filled 2013-05-11: qty 20

## 2013-05-11 MED ORDER — ONDANSETRON HCL 4 MG PO TABS: 4.0000 mg | ORAL_TABLET | Freq: Four times a day (QID) | ORAL | Status: DC | PRN

## 2013-05-11 MED ORDER — LIDOCAINE HCL (CARDIAC) 20 MG/ML IV SOLN
INTRAVENOUS | Status: AC
  Filled 2013-05-11: qty 5

## 2013-05-11 MED ORDER — PANTOPRAZOLE SODIUM 40 MG PO TBEC
80.0000 mg | DELAYED_RELEASE_TABLET | Freq: Every day | ORAL | Status: DC
  Administered 2013-05-12: 80 mg via ORAL
  Filled 2013-05-11 (×2): qty 2

## 2013-05-11 MED ORDER — NEOSTIGMINE METHYLSULFATE 1 MG/ML IJ SOLN
INTRAMUSCULAR | Status: AC
  Filled 2013-05-11: qty 10

## 2013-05-11 MED ORDER — MENTHOL 3 MG MT LOZG
1.0000 | LOZENGE | OROMUCOSAL | Status: DC | PRN
  Filled 2013-05-11: qty 9

## 2013-05-11 MED ORDER — OXYCODONE HCL 5 MG PO TABS
5.0000 mg | ORAL_TABLET | ORAL | Status: DC | PRN
  Administered 2013-05-11: 10 mg via ORAL
  Administered 2013-05-12 (×2): 5 mg via ORAL
  Administered 2013-05-12 (×2): 10 mg via ORAL
  Administered 2013-05-13: 5 mg via ORAL
  Administered 2013-05-13: 10 mg via ORAL
  Filled 2013-05-11 (×3): qty 2
  Filled 2013-05-11: qty 1
  Filled 2013-05-11: qty 2
  Filled 2013-05-11: qty 1
  Filled 2013-05-11: qty 2

## 2013-05-11 MED ORDER — BISACODYL 10 MG RE SUPP
10.0000 mg | Freq: Every day | RECTAL | Status: DC | PRN
  Filled 2013-05-11: qty 1

## 2013-05-11 MED ORDER — METHOCARBAMOL 100 MG/ML IJ SOLN
500.0000 mg | Freq: Four times a day (QID) | INTRAVENOUS | Status: DC | PRN
  Administered 2013-05-11: 500 mg via INTRAVENOUS
  Filled 2013-05-11: qty 5

## 2013-05-11 MED ORDER — NEOSTIGMINE METHYLSULFATE 1 MG/ML IJ SOLN
INTRAMUSCULAR | Status: DC | PRN
  Administered 2013-05-11: 4 mg via INTRAVENOUS

## 2013-05-11 MED ORDER — SIMVASTATIN 10 MG PO TABS
10.0000 mg | ORAL_TABLET | Freq: Every evening | ORAL | Status: DC
  Administered 2013-05-11 – 2013-05-12 (×2): 10 mg via ORAL
  Filled 2013-05-11 (×3): qty 1

## 2013-05-11 MED ORDER — SODIUM CHLORIDE 0.9 % IJ SOLN
INTRAMUSCULAR | Status: AC
  Filled 2013-05-11: qty 50

## 2013-05-11 MED ORDER — KETOROLAC TROMETHAMINE 15 MG/ML IJ SOLN
7.5000 mg | Freq: Four times a day (QID) | INTRAMUSCULAR | Status: AC | PRN
  Administered 2013-05-11: 7.5 mg via INTRAVENOUS
  Filled 2013-05-11: qty 1

## 2013-05-11 MED ORDER — SUCCINYLCHOLINE CHLORIDE 20 MG/ML IJ SOLN
INTRAMUSCULAR | Status: DC | PRN
  Administered 2013-05-11: 100 mg via INTRAVENOUS

## 2013-05-11 MED ORDER — BUPIVACAINE HCL (PF) 0.25 % IJ SOLN
INTRAMUSCULAR | Status: AC
  Filled 2013-05-11: qty 30

## 2013-05-11 MED ORDER — HYDROMORPHONE HCL PF 1 MG/ML IJ SOLN
INTRAMUSCULAR | Status: DC | PRN
  Administered 2013-05-11: .8 mg via INTRAVENOUS
  Administered 2013-05-11 (×3): .4 mg via INTRAVENOUS

## 2013-05-11 MED ORDER — SUCCINYLCHOLINE CHLORIDE 20 MG/ML IJ SOLN
INTRAMUSCULAR | Status: AC
  Filled 2013-05-11: qty 1

## 2013-05-11 MED ORDER — CHLORHEXIDINE GLUCONATE 4 % EX LIQD: 60.0000 mL | Freq: Once | CUTANEOUS | Status: DC

## 2013-05-11 MED ORDER — ONDANSETRON HCL 4 MG/2ML IJ SOLN
INTRAMUSCULAR | Status: DC | PRN
  Administered 2013-05-11: 4 mg via INTRAVENOUS

## 2013-05-11 MED ORDER — CEFAZOLIN SODIUM-DEXTROSE 2-3 GM-% IV SOLR
2.0000 g | INTRAVENOUS | Status: AC
  Administered 2013-05-11: 2 g via INTRAVENOUS

## 2013-05-11 MED ORDER — METOCLOPRAMIDE HCL 10 MG PO TABS: 5.0000 mg | ORAL_TABLET | Freq: Three times a day (TID) | ORAL | Status: DC | PRN

## 2013-05-11 MED ORDER — AMPHETAMINE-DEXTROAMPHET ER 10 MG PO CP24
30.0000 mg | ORAL_CAPSULE | Freq: Every day | ORAL | Status: DC
  Administered 2013-05-12 – 2013-05-13 (×2): 30 mg via ORAL
  Filled 2013-05-11 (×2): qty 3

## 2013-05-11 MED ORDER — METHOCARBAMOL 500 MG PO TABS
500.0000 mg | ORAL_TABLET | Freq: Four times a day (QID) | ORAL | Status: DC | PRN
  Administered 2013-05-11 – 2013-05-13 (×3): 500 mg via ORAL
  Filled 2013-05-11 (×3): qty 1

## 2013-05-11 MED ORDER — ROCURONIUM BROMIDE 100 MG/10ML IV SOLN
INTRAVENOUS | Status: AC
  Filled 2013-05-11: qty 1

## 2013-05-11 MED ORDER — DEXAMETHASONE SODIUM PHOSPHATE 10 MG/ML IJ SOLN
10.0000 mg | Freq: Once | INTRAMUSCULAR | Status: AC
  Administered 2013-05-11: 10 mg via INTRAVENOUS

## 2013-05-11 MED ORDER — LACTATED RINGERS IV SOLN
INTRAVENOUS | Status: DC
  Administered 2013-05-11: 1000 mL via INTRAVENOUS
  Administered 2013-05-11: 12:00:00 via INTRAVENOUS

## 2013-05-11 MED ORDER — SODIUM CHLORIDE 0.9 % IV SOLN: INTRAVENOUS | Status: DC

## 2013-05-11 MED ORDER — DOCUSATE SODIUM 100 MG PO CAPS
100.0000 mg | ORAL_CAPSULE | Freq: Two times a day (BID) | ORAL | Status: DC
  Administered 2013-05-11 – 2013-05-13 (×4): 100 mg via ORAL

## 2013-05-11 MED ORDER — DEXAMETHASONE 6 MG PO TABS
10.0000 mg | ORAL_TABLET | Freq: Every day | ORAL | Status: AC
  Administered 2013-05-12: 10 mg via ORAL
  Filled 2013-05-11: qty 1

## 2013-05-11 MED ORDER — RIVAROXABAN 10 MG PO TABS
10.0000 mg | ORAL_TABLET | Freq: Every day | ORAL | Status: DC
  Administered 2013-05-12 – 2013-05-13 (×2): 10 mg via ORAL
  Filled 2013-05-11 (×4): qty 1

## 2013-05-11 MED ORDER — CEFAZOLIN SODIUM 1-5 GM-% IV SOLN
1.0000 g | Freq: Four times a day (QID) | INTRAVENOUS | Status: AC
  Administered 2013-05-11 (×2): 1 g via INTRAVENOUS
  Filled 2013-05-11 (×2): qty 50

## 2013-05-11 MED ORDER — 0.9 % SODIUM CHLORIDE (POUR BTL) OPTIME
TOPICAL | Status: DC | PRN
  Administered 2013-05-11: 500 mL

## 2013-05-11 MED ORDER — ACETAMINOPHEN 325 MG PO TABS: 650.0000 mg | ORAL_TABLET | Freq: Four times a day (QID) | ORAL | Status: DC | PRN

## 2013-05-11 SURGICAL SUPPLY — 39 items
BAG ZIPLOCK 12X15 (MISCELLANEOUS) ×4 IMPLANT
BLADE SAW SGTL 18X1.27X75 (BLADE) ×2 IMPLANT
CAPT HIP PF COP ×2 IMPLANT
DECANTER SPIKE VIAL GLASS SM (MISCELLANEOUS) ×2 IMPLANT
DRAPE C-ARM 42X120 X-RAY (DRAPES) ×2 IMPLANT
DRAPE STERI IOBAN 125X83 (DRAPES) ×2 IMPLANT
DRAPE U-SHAPE 47X51 STRL (DRAPES) ×6 IMPLANT
DRSG ADAPTIC 3X8 NADH LF (GAUZE/BANDAGES/DRESSINGS) ×2 IMPLANT
DRSG MEPILEX BORDER 4X4 (GAUZE/BANDAGES/DRESSINGS) ×2 IMPLANT
DRSG MEPILEX BORDER 4X8 (GAUZE/BANDAGES/DRESSINGS) ×2 IMPLANT
DURAPREP 26ML APPLICATOR (WOUND CARE) ×2 IMPLANT
ELECT BLADE 6.5 EXT (BLADE) ×2 IMPLANT
ELECT REM PT RETURN 9FT ADLT (ELECTROSURGICAL) ×2
ELECTRODE REM PT RTRN 9FT ADLT (ELECTROSURGICAL) ×1 IMPLANT
EVACUATOR 1/8 PVC DRAIN (DRAIN) ×2 IMPLANT
FACESHIELD LNG OPTICON STERILE (SAFETY) ×8 IMPLANT
GLOVE BIO SURGEON STRL SZ7.5 (GLOVE) ×2 IMPLANT
GLOVE BIO SURGEON STRL SZ8 (GLOVE) ×4 IMPLANT
GLOVE BIOGEL PI IND STRL 8 (GLOVE) ×2 IMPLANT
GLOVE BIOGEL PI INDICATOR 8 (GLOVE) ×2
GOWN PREVENTION PLUS LG XLONG (DISPOSABLE) ×2 IMPLANT
GOWN STRL REIN XL XLG (GOWN DISPOSABLE) ×2 IMPLANT
KIT BASIN OR (CUSTOM PROCEDURE TRAY) ×2 IMPLANT
NDL SAFETY ECLIPSE 18X1.5 (NEEDLE) ×2 IMPLANT
NEEDLE HYPO 18GX1.5 SHARP (NEEDLE) ×2
PACK TOTAL JOINT (CUSTOM PROCEDURE TRAY) ×2 IMPLANT
PADDING CAST COTTON 6X4 STRL (CAST SUPPLIES) ×2 IMPLANT
SPONGE GAUZE 4X4 12PLY (GAUZE/BANDAGES/DRESSINGS) ×2 IMPLANT
STRIP CLOSURE SKIN 1/2X4 (GAUZE/BANDAGES/DRESSINGS) ×2 IMPLANT
SUCTION FRAZIER 12FR DISP (SUCTIONS) IMPLANT
SUT ETHIBOND NAB CT1 #1 30IN (SUTURE) ×2 IMPLANT
SUT MNCRL AB 4-0 PS2 18 (SUTURE) ×2 IMPLANT
SUT VIC AB 2-0 CT1 27 (SUTURE) ×2
SUT VIC AB 2-0 CT1 TAPERPNT 27 (SUTURE) ×2 IMPLANT
SUT VLOC 180 0 24IN GS25 (SUTURE) ×2 IMPLANT
SYR 20CC LL (SYRINGE) ×2 IMPLANT
SYR 50ML LL SCALE MARK (SYRINGE) ×2 IMPLANT
TOWEL OR 17X26 10 PK STRL BLUE (TOWEL DISPOSABLE) ×2 IMPLANT
TRAY FOLEY CATH 14FRSI W/METER (CATHETERS) ×2 IMPLANT

## 2013-05-11 NOTE — Transfer of Care (Signed)
Immediate Anesthesia Transfer of Care Note  Patient: Tanner [name]  Procedure(s) Performed: Procedure(s) with comments: RIGHT TOTAL HIP ARTHROPLASTY ANTERIOR APPROACH (Right) - WOUND CLASSIFICATION-CLEAN  Patient Location: PACU  Anesthesia Type:General  Level of Consciousness: awake, alert  and oriented  Airway & Oxygen Therapy: Patient Spontanous Breathing and Patient connected to face mask oxygen  Post-op Assessment: Report given to PACU RN and Post -op Vital signs reviewed and stable  Post vital signs: Reviewed and stable  Complications: No apparent anesthesia complications

## 2013-05-11 NOTE — Anesthesia Preprocedure Evaluation (Addendum)
Anesthesia Evaluation  Patient identified by MRN, date of birth, ID band Patient awake    Reviewed: Allergy & Precautions, H&P , NPO status , Patient's Chart, lab work & pertinent test results  Airway Mallampati: III TM Distance: >3 FB Neck ROM: full    Dental no notable dental hx. (+) Teeth Intact and Dental Advisory Given   Pulmonary sleep apnea ,  breath sounds clear to auscultation  Pulmonary exam normal       Cardiovascular Exercise Tolerance: Good negative cardio ROS  Rhythm:regular Rate:Normal     Neuro/Psych negative neurological ROS  negative psych ROS   GI/Hepatic negative GI ROS, Neg liver ROS, GERD-  Medicated and Controlled,  Endo/Other  negative endocrine ROS  Renal/GU negative Renal ROS  negative genitourinary   Musculoskeletal   Abdominal   Peds  Hematology negative hematology ROS (+)   Anesthesia Other Findings   Reproductive/Obstetrics negative OB ROS                          Anesthesia Physical Anesthesia Plan  ASA: III  Anesthesia Plan: General   Post-op Pain Management:    Induction:   Airway Management Planned: Oral ETT  Additional Equipment:   Intra-op Plan:   Post-operative Plan: Extubation in OR  Informed Consent: I have reviewed the patients History and Physical, chart, labs and discussed the procedure including the risks, benefits and alternatives for the proposed anesthesia with the patient or authorized representative who has indicated his/her understanding and acceptance.   Dental Advisory Given  Plan Discussed with: CRNA and Surgeon  Anesthesia Plan Comments:        Anesthesia Quick Evaluation

## 2013-05-11 NOTE — H&P (View-Only) (Signed)
Tanner [name]  DOB: 10/13/1945 Widowed / Language: English / Race: White Male  Date of Admission:  05-11-2013  Chief Complaint:  Right Hip Pain  History of Present Illness The patient is a 67 year old male who comes in for a preoperative History and Physical. The patient is scheduled for a right total hip arthroplasty (anterior approach) to be performed by Dr. Frank V. [name], MD at [facility] on 05/11/2013. The patient is a 67 year old male who presents with a hip problem. The patient reports left hip and right hip (worse) problems including pain symptoms that have been present for 7 month(s). The symptoms began without any known injury. Symptoms reported include hip pain, weakness and numbness (in thigh) The patient reports symptoms radiating to the: right groin and right thigh. Symptoms are exacerbated by walking. He has been having trouble since January. He also has pain in the left groin but not as significant. He had an MRI of the hips in May which showed AVN in both hips, more progressed in the right than left. He is prepared for a total hip replacement, right before left. He does have hernias that he has yet to have evaluated and does need to have those checked before moving forward with surgery. He still has a considerable pain in the right hip, but he does not have constant pain. He will get intense flashes of pain and then have a lower baseline level. It is starting to limit what he can and cannot do. He still tries to work out and does have avid exercise, but it is getting more difficult to do so. He has necrosis of the hip. He is ready to proceed with hip surgery. They have been treated conservatively in the past for the above stated problem and despite conservative measures, they continue to have progressive pain and severe functional limitations and dysfunction. They have failed non-operative management including home exercise, medications. It is felt that they  would benefit from undergoing total joint replacement. Risks and benefits of the procedure have been discussed with the patient and they elect to proceed with surgery. There are no active contraindications to surgery such as ongoing infection or rapidly progressive neurological disease.    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 Constipation    Family History Congestive Heart Failure. Father. father Kidney disease. father Hypertension. mother Osteoarthritis. mother and grandmother mothers side Heart disease in male family member before age 55 Cerebrovascular Accident. grandfather mothers side Cancer. grandmother fathers side Depression. mother Heart Disease. father, grandfather mothers side and grandfather fathers side Drug / Alcohol Addiction. father, grandfather mothers side and grandfather fathers side    Social History Marital status. widowed Living situation. live alone Pain Contract. no Tobacco use. Never smoker. never smoker Tobacco / smoke exposure. yes outdoors only Illicit drug use. no Children. 2 Alcohol use. current drinker; drinks beer, wine and hard liquor; 5-7 per week Copy of Drug/Alcohol Rehab (Previously). no Drug/Alcohol Rehab (Currently). no Current work status. retired Post-Surgical Plans. Home with friend (Gail)    Medication History Adderall ( Oral) Specific dose unknown - Active. Simvastatin (10MG Tablet, Oral) Active. Minocycline HCl ( Oral) Specific dose unknown - Active. MetroNIDAZOLE (0.75% Cream, External) Active. Omeprazole (20MG Capsule DR, Oral) Active. Aspirin (81MG Tablet, 1 (one) Oral) Active. Omega 3 ( Oral) Specific dose unknown - Active. Vitamin D (1 (one) Oral) Specific dose unknown - Active. Viibryd (20MG Tablet, Oral)   Active.    Past Surgical History Inguinal Hernia Repair. open: left 06/1999 Umbilical Hernia Repair  01/2013 Foot Surgery. bilateral (neuromas) 1995 Tonsillectomy 1953 Rotator Cuff Repair. right 2008 Cataract Surgery. right Oct 2011 Arthroscopy of Shoulder. right Arthroscopy of Knee. right Jaw Implants 2010 Left clavicle 1980 Varicose Vein Removal 05/2012 and 06/2012   Medical History Sinus Allergies Irritable Bowel Syndrome Hypercholesterolemia Gastroesophageal Reflux Disease Depression Osteoarthritis Migraine Headache Irritable bowel syndrome Anxiety Disorder Obstructive Sleep Apnea Skin Cancer Rosacea Postop Constipation (severe following Umbilical Hernia surgery)   Review of Systems General:Not Present- Chills, Fever, Night Sweats, Fatigue, Weight Gain, Weight Loss and Memory Loss. Skin:Not Present- Hives, Itching, Rash, Eczema and Lesions. HEENT:Not Present- Tinnitus, Headache, Double Vision, Visual Loss, Hearing Loss and Dentures. Respiratory:Not Present- Shortness of breath with exertion, Shortness of breath at rest, Allergies, Coughing up blood and Chronic Cough. Cardiovascular:Not Present- Chest Pain, Racing/skipping heartbeats, Difficulty Breathing Lying Down, Murmur, Swelling and Palpitations. Gastrointestinal:Not Present- Bloody Stool, Heartburn, Abdominal Pain, Vomiting, Nausea, Constipation, Diarrhea, Difficulty Swallowing, Jaundice and Loss of appetitie. Male Genitourinary:Not Present- Urinary frequency, Blood in Urine, Weak urinary stream, Discharge, Flank Pain, Incontinence, Painful Urination, Urgency, Urinary Retention and Urinating at Night. Musculoskeletal:Not Present- Muscle Weakness, Muscle Pain, Joint Swelling, Joint Pain, Back Pain, Morning Stiffness and Spasms. Neurological:Not Present- Tremor, Dizziness, Blackout spells, Paralysis, Difficulty with balance and Weakness. Psychiatric:Not Present- Insomnia.    Vitals Weight: 182 lb Height: 70.5 in Weight was reported by patient. Height was reported by patient. Body Surface  Area: 2.03 m Body Mass Index: 25.74 kg/m Pulse: 56 (Regular) Resp.: 14 (Unlabored) BP: 116/58 (Sitting, Left Arm, Standard)     Physical Exam The physical exam findings are as follows:   General Mental Status - Alert, cooperative and good historian. General Appearance- pleasant. Not in acute distress. Orientation- Oriented X3. Build & Nutrition- Well nourished and Well developed.   Head and Neck Head- normocephalic, atraumatic . Neck Global Assessment- supple. no bruit auscultated on the right and no bruit auscultated on the left.   Eye Pupil- Bilateral- Regular and Round. Motion- Bilateral- EOMI.   Chest and Lung Exam Auscultation: Breath sounds:- clear at anterior chest wall and - clear at posterior chest wall. Adventitious sounds:- No Adventitious sounds.   Cardiovascular Auscultation:Rhythm- Regular rate and rhythm. Heart Sounds- S1 WNL and S2 WNL. Murmurs & Other Heart Sounds:Auscultation of the heart reveals - No Murmurs.   Abdomen Palpation/Percussion:Tenderness- Abdomen is non-tender to palpation. Rigidity (guarding)- Abdomen is soft. Auscultation:Auscultation of the abdomen reveals - Bowel sounds normal.   Male Genitourinary Not done, not pertinent to present illness  Musculoskeletal Well developed male, alert and oriented in no apparent distress. He is in excellent physical condition. He appears younger than his stated age. His right hip can be flexed to about 90, about 10 degrees of internal rotation, 20 degrees of external rotation, 20 degrees abduction. He has a normal right knee examination. Pulse, sensation and motor intact. Left hip exam is normal.  RADIOGRAPHS: Plain radiographs, AP pelvis and lateral of the right hip taken today show that on AP he had some subchondral sclerosis in the femoral head, but on the lateral he has an area where the femoral head is flattened. I reviewed his MRI from  approximately May of this year and he had nearly 100% involvement of his femoral head with edema and avascular necrosis. The left femoral head had a smaller area that was involved.   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 [name].  Plan is to go home  PCP - Dr. Koiralo - Patient has been seen preoperatively and felt to be stable for surgery.  The patient does not have any contraindications and will receive TXA (tranexamic acid) prior to surgery.  Signed electronically by Alexzandrew L Perkins, III PA-C 

## 2013-05-11 NOTE — Op Note (Signed)
OPERATIVE REPORT  PREOPERATIVE DIAGNOSIS: Osteonecrosis of the Right hip.   POSTOPERATIVE DIAGNOSIS: Osteonecrosis of the Right  hip.   PROCEDURE: Right total hip arthroplasty, anterior approach.   SURGEON: Ollen Gross, MD   ASSISTANT: Avel Peace, PA-C  ANESTHESIA:  General  ESTIMATED BLOOD LOSS:-* No blood loss amount entered *    DRAINS: Hemovac x1.   COMPLICATIONS: None   CONDITION: PACU - hemodynamically stable.   BRIEF CLINICAL NOTE: Tanner [name] is a 67 y.o. 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   PROCEDURE IN DETAIL: After successful administration of spinal  anesthetic, the traction boots for the [facility] bed were placed on both  feet and the patient was placed onto the [facility] bed, boots placed into the leg  holders. The Right hip was then isolated from the perineum with plastic  drapes and prepped and draped in the usual sterile fashion. ASIS and  greater trochanter were marked and a oblique incision was made, starting  at about 1 cm lateral and 2 cm distal to the ASIS and coursing towards  the anterior cortex of the femur. The skin was cut with a 10 blade  through subcutaneous tissue to the level of the fascia overlying the  tensor fascia lata muscle. The fascia was then incised in line with the  incision at the junction of the anterior third and posterior 2/3rd. The  muscle was teased off the fascia and then the interval between the TFL  and the rectus was developed. The Hohmann retractor was then placed at  the top of the femoral neck over the capsule. The vessels overlying the  capsule were cauterized and the fat on top of the capsule was removed.  A Hohmann retractor was then placed anterior underneath the rectus  femoris to give exposure to the entire anterior capsule. A T-shaped   capsulotomy was performed. The edges were tagged and the femoral head  was identified.       Osteophytes are removed off the superior acetabulum.  The femoral neck was then cut in situ with an oscillating saw. Traction  was then applied to the left lower extremity utilizing the [facility]  traction. The femoral head was then removed. Retractors were placed  around the acetabulum and then circumferential removal of the labrum was  performed. Osteophytes were also removed. Reaming starts at 47 mm to  medialize and  Increased in 2 mm increments to 51 mm. We reamed in  approximately 40 degrees of abduction, 20 degrees anteversion. A 52 mm  pinnacle acetabular shell was then impacted in anatomic position under  fluoroscopic guidance with excellent purchase. We did not need to place  any additional dome screws. A 32 mm neutral + 4 marathon liner was then  placed into the acetabular shell.       The femoral lift was then placed along the lateral aspect of the femur  just distal to the vastus ridge. The leg was  externally rotated and capsule  was stripped off the inferior aspect of the femoral neck down to the  level of the lesser trochanter, this was done with electrocautery. The femur was lifted after this was performed. The  leg was then placed and extended in adducted position to essentially delivering the femur. We also removed the capsule superiorly and the  piriformis from the piriformis fossa to gain excellent exposure of the  proximal femur. Rongeur was used to remove some cancellous bone to get  into the lateral portion of the proximal femur for placement of the  initial starter reamer. The starter broaches was placed  the starter broach  and was shown to go down the center of the canal. Broaching  with the  Corail system was then performed starting at size 8, coursing  Up to size 12. A size 12 had excellent torsional and rotational  and axial stability. The trial standard offset neck was  then placed  with a 32 + 5 trial head. The hip was then reduced. We confirmed that  the stem was in the canal both on AP and lateral x-rays. It also has excellent sizing. The hip was reduced with outstanding stability through full extension, full external rotation,  and then flexion in adduction internal rotation. AP pelvis was taken  and the leg lengths were measured and found to be exactly equal. Hip  was then dislocated again and the femoral head and neck removed. The  femoral broach was removed. Size 12 Corail stem with a standard offset  neck was then impacted into the femur following native anteversion. Has  excellent purchase in the canal. Excellent torsional and rotational and  axial stability. It is confirmed to be in the canal on AP and lateral  fluoroscopic views. The 32 + 5 ceramic head was placed and the hip  reduced with outstanding stability. Again AP pelvis was taken and it  confirmed that the leg lengths were equal. The wound was then copiously  irrigated with saline solution and the capsule reattached and repaired  with Ethibond suture.  20 mL of Exparel mixed with 50 mL of saline then additional 20 ml of .25% Bupivicaine injected into the capsule and into the edge of the tensor fascia lata as well as subcutaneous tissue. The fascia overlying the tensor fascia lata was  then closed with a running #1 V-Loc. Subcu was closed with interrupted  2-0 Vicryl and subcuticular running 4-0 Monocryl. Incision was cleaned  and dried. Steri-Strips and a bulky sterile dressing applied. Hemovac  drain was hooked to suction and then he was awakened and transported to  recovery in stable condition.        Please note that a surgical assistant was a medical necessity for this procedure to perform it in a safe and expeditious manner. Assistant was necessary to provide appropriate retraction of vital neurovascular structures and to prevent femoral fracture and allow for anatomic placement of the  prosthesis.  Ollen Gross, M.D.

## 2013-05-11 NOTE — Anesthesia Postprocedure Evaluation (Signed)
  Anesthesia Post-op Note  Patient: Tanner [name]  Procedure(s) Performed: Procedure(s) (LRB): RIGHT TOTAL HIP ARTHROPLASTY ANTERIOR APPROACH (Right)  Patient Location: PACU  Anesthesia Type: General  Level of Consciousness: awake and alert   Airway and Oxygen Therapy: Patient Spontanous Breathing  Post-op Pain: mild  Post-op Assessment: Post-op Vital signs reviewed, Patient's Cardiovascular Status Stable, Respiratory Function Stable, Patent Airway and No signs of Nausea or vomiting  Last Vitals:  Filed Vitals:   05/11/13 1430  BP: 130/68  Pulse: 51  Temp:   Resp: 10    Post-op Vital Signs: stable   Complications: No apparent anesthesia complications

## 2013-05-11 NOTE — Interval H&P Note (Signed)
History and Physical Interval Note:  05/11/2013 10:00 AM  Tanner [name]  has presented today for surgery, with the diagnosis of Avascular Neucrosis of Right Hip  The various methods of treatment have been discussed with the patient and family. After consideration of risks, benefits and other options for treatment, the patient has consented to  Procedure(s): RIGHT TOTAL HIP ARTHROPLASTY ANTERIOR APPROACH (Right) as a surgical intervention .  The patient's history has been reviewed, patient examined, no change in status, stable for surgery.  I have reviewed the patient's chart and labs.  Questions were answered to the patient's satisfaction.     Loanne Drilling

## 2013-05-11 NOTE — Progress Notes (Signed)
Utilization review completed.  

## 2013-05-12 ENCOUNTER — Encounter (HOSPITAL_COMMUNITY): Payer: Self-pay | Admitting: Orthopedic Surgery

## 2013-05-12 LAB — BASIC METABOLIC PANEL
BUN: 13 mg/dL (ref 6–23)
Calcium: 8.6 mg/dL (ref 8.4–10.5)
Chloride: 104 mEq/L (ref 96–112)
Creatinine, Ser: 0.98 mg/dL (ref 0.50–1.35)
GFR calc Af Amer: >90 mL/min (ref >90)
Glucose, Bld: 169 mg/dL — ABNORMAL HIGH (ref 70–99)

## 2013-05-12 LAB — CBC
HCT: 38.9 % — ABNORMAL LOW (ref 39.0–52.0)
MCHC: 33.4 g/dL (ref 30.0–36.0)
MCV: 96.3 fL (ref 78.0–100.0)
Platelets: 204 10*3/uL (ref 150–400)
RDW: 13.2 % (ref 11.5–15.5)

## 2013-05-12 MED ORDER — NON FORMULARY: 40.0000 mg | Freq: Every day | Status: DC

## 2013-05-12 MED ORDER — OMEPRAZOLE 20 MG PO CPDR
40.0000 mg | DELAYED_RELEASE_CAPSULE | Freq: Every day | ORAL | Status: DC
  Administered 2013-05-12 – 2013-05-13 (×2): 40 mg via ORAL
  Filled 2013-05-12 (×2): qty 2

## 2013-05-12 NOTE — Progress Notes (Signed)
05/12/13 1500  PT Visit Information  Last PT Received On 05/12/13  Assistance Needed +1  History of Present Illness s/p right DA THA  PT Time Calculation  PT Start Time 1450  PT Stop Time 1514  PT Time Calculation (min) 24 min  Subjective Data  Patient Stated Goal return to I  Precautions  Precautions Fall  Restrictions  Other Position/Activity Restrictions WBAT  Cognition  Arousal/Alertness Awake/alert  Behavior During Therapy WFL for tasks assessed/performed  Overall Cognitive Status Within Functional Limits for tasks assessed  Bed Mobility  Bed Mobility Supine to Sit;Sit to Supine  Supine to Sit 5: Supervision  Sit to Supine 5: Supervision  Details for Bed Mobility Assistance pt self assists RLE  Transfers  Transfers Stand to Sit;Sit to Stand  Sit to Stand 4: Min guard;5: Supervision  Stand to Sit 4: Min guard;5: Supervision  Details for Transfer Assistance cues for hand placement  Ambulation/Gait  Ambulation/Gait Assistance 5: Supervision  Ambulation Distance (Feet) 180 Feet  Assistive device Rolling walker  Ambulation/Gait Assistance Details cues for Rw safety  Gait Pattern Step-through pattern  Total Joint Exercises  Ankle Circles/Pumps AROM;Both;10 reps  Quad Sets AROM;Right;10 reps  Heel Slides AROM;AAROM;Right;5 reps (using sheet to assist)  Hip ABduction/ADduction AROM;AAROM;5 reps;10 reps;Right;Supine;Standing  Knee Flexion AROM;Right;Strengthening;10 reps;Standing  Standing Hip Extension Strengthening;AROM;Right;10 reps;Standing  Marching in Standing AROM;Right;10 reps;Standing;Strengthening  PT - End of Session  Activity Tolerance Patient tolerated treatment well  Patient left in bed;with call bell/phone within reach;with family/visitor present  Nurse Communication Mobility status  PT - Assessment/Plan  PT Plan Current plan remains appropriate  PT Frequency 7X/week  Follow Up Recommendations Home health PT  PT equipment Rolling walker with 5" wheels   PT Goal Progression  Progress towards PT goals Progressing toward goals  Acute Rehab PT Goals  Time For Goal Achievement 05/16/13  Potential to Achieve Goals Good  PT General Charges  $$ ACUTE PT VISIT 1 Procedure  PT Treatments  $Gait Training 8-22 mins  $Therapeutic Exercise 8-22 mins

## 2013-05-12 NOTE — Progress Notes (Signed)
   Subjective: 1 Day Post-Op Procedure(s) (LRB): RIGHT TOTAL HIP ARTHROPLASTY ANTERIOR APPROACH (Right) Patient reports pain as mild.   Patient seen in rounds with Dr. Lequita Halt.  Wife in room at bedside.  Not much rest last night. Patient is well, but has had some minor complaints of pain in the hip, requiring pain medications We will start therapy today.  Plan is to go Home after hospital stay.  Objective: Vital signs in last 24 hours: Temp:  [97 F (36.1 C)-98.2 F (36.8 C)] 97.2 F (36.2 C) (12/11 0528) Pulse Rate:  [49-74] 59 (12/11 0829) Resp:  [10-17] 17 (12/11 0146) BP: (103-152)/(63-84) 123/68 mmHg (12/11 0829) SpO2:  [93 %-100 %] 99 % (12/11 0528) Weight:  [83.915 kg (185 lb)] 83.915 kg (185 lb) (12/10 1513)  Intake/Output from previous day:  Intake/Output Summary (Last 24 hours) at 05/12/13 0940 Last data filed at 05/12/13 0820  Gross per 24 hour  Intake   5595 ml  Output   5230 ml  Net    365 ml    Intake/Output this shift: Total I/O In: 240 [P.O.:240] Out: -   Labs:  Recent Labs  05/12/13 0515  HGB 13.0    Recent Labs  05/12/13 0515  WBC 11.3*  RBC 4.04*  HCT 38.9*  PLT 204    Recent Labs  05/12/13 0515  NA 138  K 4.5  CL 104  CO2 25  BUN 13  CREATININE 0.98  GLUCOSE 169*  CALCIUM 8.6   No results found for this basename: LABPT, INR,  in the last 72 hours  EXAM General - Patient is Alert, Appropriate and Oriented Extremity - Neurovascular intact Sensation intact distally Dorsiflexion/Plantar flexion intact Dressing - dressing C/D/I Motor Function - intact, moving foot and toes well on exam.  Hemovac pulled without difficulty.  Past Medical History  Diagnosis Date  . IBS (irritable bowel syndrome)   . Rosacea   . Depression   . ADD (attention deficit disorder)   . Esophageal reflux   . BPH (benign prostatic hypertrophy)   . OSA (obstructive sleep apnea)   . Arthritis   . Cancer     basal skin     Assessment/Plan: 1  Day Post-Op Procedure(s) (LRB): RIGHT TOTAL HIP ARTHROPLASTY ANTERIOR APPROACH (Right) Principal Problem:   Avascular necrosis of hip Active Problems:   OA (osteoarthritis) of hip  Estimated body mass index is 26.16 kg/(m^2) as calculated from the following:   Height as of this encounter: 5' 10.5" (1.791 m).   Weight as of this encounter: 83.915 kg (185 lb). Up with therapy Plan for discharge tomorrow Discharge home with home health  DVT Prophylaxis - Xarelto Weight Bearing As Tolerated right Leg Hemovac Pulled Begin Therapy   Yug Loria 05/12/2013, 9:40 AM

## 2013-05-12 NOTE — Evaluation (Signed)
Occupational Therapy Evaluation Patient Details Name: Tanner [name] MRN: [id number] DOB: [birth year] Today's Date: 05/12/2013 Time: 4098-1191 OT Time Calculation (min): 17 min  OT Assessment / Plan / Recommendation History of present illness s/p DA THA on R   Clinical Impression   *pt was admitted for R DA THA.  All education was completed.  Pt does not need any further OT nor bathroom DME     OT Assessment  Patient does not need any further OT services    Follow Up Recommendations  No OT follow up    Barriers to Discharge      Equipment Recommendations  None recommended by OT (has shower chair and borrowed 3:1)    Recommendations for Other Services    Frequency       Precautions / Restrictions Precautions Precautions: Fall Restrictions Weight Bearing Restrictions: No Other Position/Activity Restrictions: WBAT   Pertinent Vitals/Pain No c/o pain but c/o stiffness R hip.  Repositioned and applied ice    ADL  Grooming: Set up Where Assessed - Grooming: Supported standing Upper Body Bathing: Set up Where Assessed - Upper Body Bathing: Unsupported sitting Lower Body Bathing: Moderate assistance Where Assessed - Lower Body Bathing: Supported sit to stand Upper Body Dressing: Set up Where Assessed - Upper Body Dressing: Unsupported sitting Lower Body Dressing: Moderate assistance Where Assessed - Lower Body Dressing: Supported sit to stand Toilet Transfer: Hydrographic surveyor Method: Sit to Barista: Raised toilet seat with arms (or 3-in-1 over toilet) Toileting - Clothing Manipulation and Hygiene: Min guard Where Assessed - Toileting Clothing Manipulation and Hygiene: Sit to stand from 3-in-1 or toilet Tub/Shower Transfer: Min guard Tub/Shower Transfer Method: Ambulating Equipment Used: Rolling walker Transfers/Ambulation Related to ADLs: ambulated to bathroom and performed bathroom transfers.   ADL Comments: Friend will assist with LB  adls--does not want AE/reacher.  His shower door won't open completely.  Unsure if he can back in with walker.  HHPT can look at this with him    OT Diagnosis:    OT Problem List:   OT Treatment Interventions:     OT Goals(Current goals can be found in the care plan section)    Visit Information  Last OT Received On: 05/12/13 Assistance Needed: +1 History of Present Illness: s/p DA        Prior Functioning     Home Living Family/patient expects to be discharged to:: Private residence Living Arrangements: Alone Available Help at Discharge: Friend(s) Type of Home: House Home Access: Stairs to enter Secretary/administrator of Steps: 3 Home Layout: Multi-level Alternate Level Stairs-Number of Steps: 2-3 Alternate Level Stairs-Rails: None Home Equipment: Bedside commode;Crutches; shower seat Additional Comments: shower stall door won't open completely Prior Function Level of Independence: Independent Communication Communication: No difficulties         Vision/Perception     Cognition  Cognition Arousal/Alertness: Awake/alert Behavior During Therapy: WFL for tasks assessed/performed Overall Cognitive Status: Within Functional Limits for tasks assessed    Extremity/Trunk Assessment Upper Extremity Assessment Upper Extremity Assessment: Overall WFL for tasks assessed (h/o R elbow sx in April--full ROM)     Mobility Transfers Transfers: Sit to Stand;Stand to Sit Sit to Stand: 4: Min guard Stand to Sit: 4: Min guard Details for Transfer Assistance: cues for hand placement     Exercise     Balance     End of Session OT - End of Session Activity Tolerance: Patient tolerated treatment well Patient left: in chair;with call bell/phone within  reach  GO     Jonel Sick 05/12/2013, 12:30 PM Marica Otter, OTR/L [phone] 05/12/2013

## 2013-05-12 NOTE — Evaluation (Signed)
Physical Therapy Evaluation Patient Details Name: Tanner [name] MRN: [id number] DOB: 01-Jan-1946 Today's Date: 05/12/2013 Time: 1136-1203 PT Time Calculation (min): 27 min  PT Assessment / Plan / Recommendation History of Present Illness  s/p DA right DHA  Clinical Impression  Pt will benefit from PT to address deficits below.     PT Assessment  Patient needs continued PT services    Follow Up Recommendations  Home health PT;No PT follow up    Does the patient have the potential to tolerate intense rehabilitation      Barriers to Discharge        Equipment Recommendations  Rolling walker with 5" wheels    Recommendations for Other Services     Frequency 7X/week    Precautions / Restrictions Precautions Precautions: Fall Restrictions Other Position/Activity Restrictions: WBAT   Pertinent Vitals/Pain VSS,pain controlled      Mobility  Bed Mobility Bed Mobility: Supine to Sit Supine to Sit: 4: Min assist Details for Bed Mobility Assistance: min with RLE Transfers Transfers: Stand to Sit;Sit to Stand Sit to Stand: 4: Min guard Stand to Sit: 4: Min guard Details for Transfer Assistance: cues for hand placement Ambulation/Gait Ambulation/Gait Assistance: 4: Min guard;5: Supervision Ambulation Distance (Feet): 120 Feet Assistive device: Rolling walker Ambulation/Gait Assistance Details: cues for sequence, step through  Gait Pattern: Step-through pattern    Exercises Total Joint Exercises Ankle Circles/Pumps: AROM;Both;10 reps Quad Sets: AROM;Right;10 reps Heel Slides: AROM;AAROM;10 reps;Right Long Arc Quad: AROM;10 reps;Right   PT Diagnosis: Difficulty walking  PT Problem List: Decreased strength;Decreased range of motion;Decreased activity tolerance;Decreased mobility;Decreased balance;Decreased knowledge of use of DME PT Treatment Interventions: DME instruction;Gait training;Functional mobility training;Therapeutic activities;Therapeutic  exercise;Patient/family education;Stair training     PT Goals(Current goals can be found in the care plan section) Acute Rehab PT Goals Patient Stated Goal: return to I PT Goal Formulation: With patient Time For Goal Achievement: 05/16/13 Potential to Achieve Goals: Good  Visit Information  Last PT Received On: 05/12/13 Assistance Needed: +1 History of Present Illness: s/p DA right DHA       Prior Functioning  Home Living Family/patient expects to be discharged to:: Private residence Living Arrangements: Alone Available Help at Discharge: Friend(s) Type of Home: House Home Access: Stairs to enter Secretary/administrator of Steps: 3 Home Layout: Multi-level Alternate Level Stairs-Number of Steps: 2-3 Alternate Level Stairs-Rails: None Home Equipment: Bedside commode;Crutches Additional Comments: shower stall door won't open completely Prior Function Level of Independence: Independent Communication Communication: No difficulties    Cognition  Cognition Arousal/Alertness: Awake/alert Behavior During Therapy: WFL for tasks assessed/performed Overall Cognitive Status: Within Functional Limits for tasks assessed    Extremity/Trunk Assessment Upper Extremity Assessment Upper Extremity Assessment: Overall WFL for tasks assessed (h/o R elbow sx in April--full ROM) Lower Extremity Assessment Lower Extremity Assessment: RLE deficits/detail RLE Deficits / Details: AAROM grossly WFL   Balance    End of Session PT - End of Session Equipment Utilized During Treatment: Gait belt Activity Tolerance: Patient tolerated treatment well Patient left: in bed;with call bell/phone within reach Nurse Communication: Mobility status  GP     [facility] 05/12/2013, 1:54 PM

## 2013-05-13 LAB — BASIC METABOLIC PANEL
BUN: 16 mg/dL (ref 6–23)
Calcium: 8.8 mg/dL (ref 8.4–10.5)
GFR calc Af Amer: >90 mL/min (ref >90)
GFR calc non Af Amer: 82 mL/min — ABNORMAL LOW (ref >90)
Glucose, Bld: 119 mg/dL — ABNORMAL HIGH (ref 70–99)
Potassium: 4 mEq/L (ref 3.5–5.1)
Sodium: 139 mEq/L (ref 135–145)

## 2013-05-13 LAB — CBC
HCT: 38 % — ABNORMAL LOW (ref 39.0–52.0)
Hemoglobin: 12.7 g/dL — ABNORMAL LOW (ref 13.0–17.0)
MCH: 32.2 pg (ref 26.0–34.0)
MCHC: 33.4 g/dL (ref 30.0–36.0)
Platelets: 192 10*3/uL (ref 150–400)
RDW: 13.6 % (ref 11.5–15.5)
WBC: 13.8 10*3/uL — ABNORMAL HIGH (ref 4.0–10.5)

## 2013-05-13 MED ORDER — TRAMADOL HCL 50 MG PO TABS: 50.0000 mg | ORAL_TABLET | Freq: Four times a day (QID) | ORAL | Status: DC | PRN

## 2013-05-13 MED ORDER — OXYCODONE HCL 5 MG PO TABS: 5.0000 mg | ORAL_TABLET | ORAL | Status: DC | PRN

## 2013-05-13 MED ORDER — RIVAROXABAN 10 MG PO TABS: 10.0000 mg | ORAL_TABLET | Freq: Every day | ORAL | Status: DC

## 2013-05-13 MED ORDER — METHOCARBAMOL 500 MG PO TABS: 500.0000 mg | ORAL_TABLET | Freq: Four times a day (QID) | ORAL | Status: DC | PRN

## 2013-05-13 NOTE — Progress Notes (Signed)
   Subjective: 2 Days Post-Op Procedure(s) (LRB): RIGHT TOTAL HIP ARTHROPLASTY ANTERIOR APPROACH (Right) Patient reports pain as mild and moderate.   Patient seen in rounds with Dr. Lequita Halt.  Having spasms on the leg. Patient is well, but has had some minor complaints of pain in the hip, requiring pain medications Patient is ready to go home today  Objective: Vital signs in last 24 hours: Temp:  [97.3 F (36.3 C)-98.2 F (36.8 C)] 98.2 F (36.8 C) (12/12 0558) Pulse Rate:  [65-68] 65 (12/12 0558) Resp:  [16] 16 (12/12 0800) BP: (116-128)/(72-78) 126/78 mmHg (12/12 0558) SpO2:  [97 %-100 %] 100 % (12/12 0800)  Intake/Output from previous day:  Intake/Output Summary (Last 24 hours) at 05/13/13 0948 Last data filed at 05/13/13 0600  Gross per 24 hour  Intake    600 ml  Output   2350 ml  Net  -1750 ml    Intake/Output this shift:    Labs:  Recent Labs  05/12/13 0515 05/13/13 0448  HGB 13.0 12.7*    Recent Labs  05/12/13 0515 05/13/13 0448  WBC 11.3* 13.8*  RBC 4.04* 3.94*  HCT 38.9* 38.0*  PLT 204 192    Recent Labs  05/12/13 0515 05/13/13 0448  NA 138 139  K 4.5 4.0  CL 104 105  CO2 25 27  BUN 13 16  CREATININE 0.98 0.99  GLUCOSE 169* 119*  CALCIUM 8.6 8.8   No results found for this basename: LABPT, INR,  in the last 72 hours  EXAM: General - Patient is Alert, Appropriate and Oriented Extremity - Neurovascular intact Sensation intact distally Incision - clean, dry, no drainage, healing Motor Function - intact, moving foot and toes well on exam.   Assessment/Plan: 2 Days Post-Op Procedure(s) (LRB): RIGHT TOTAL HIP ARTHROPLASTY ANTERIOR APPROACH (Right) Procedure(s) (LRB): RIGHT TOTAL HIP ARTHROPLASTY ANTERIOR APPROACH (Right) Past Medical History  Diagnosis Date  . IBS (irritable bowel syndrome)   . Rosacea   . Depression   . ADD (attention deficit disorder)   . Esophageal reflux   . BPH (benign prostatic hypertrophy)   . OSA  (obstructive sleep apnea)   . Arthritis   . Cancer     basal skin    Principal Problem:   Avascular necrosis of hip Active Problems:   OA (osteoarthritis) of hip  Estimated body mass index is 26.16 kg/(m^2) as calculated from the following:   Height as of this encounter: 5' 10.5" (1.791 m).   Weight as of this encounter: 83.915 kg (185 lb). Up with therapy Discharge home with home health Diet - Regular diet Follow up - in 2 weeks Activity - WBAT Disposition - Home Condition Upon Discharge - Good D/C Meds - See DC Summary DVT Prophylaxis - Xarelto  Tanner [name] 05/13/2013, 9:48 AM

## 2013-05-13 NOTE — Progress Notes (Signed)
Physical Therapy Treatment Patient Details Name: Tanner [name] MRN: [id number] DOB: 01-25-1946 Today's Date: 05/13/2013 Time: 4098-1191 PT Time Calculation (min): 35 min  PT Assessment / Plan / Recommendation  History of Present Illness s/p right DA THA   PT Comments     Follow Up Recommendations  Home health PT     Does the patient have the potential to tolerate intense rehabilitation     Barriers to Discharge        Equipment Recommendations  Rolling walker with 5" wheels    Recommendations for Other Services    Frequency 7X/week   Progress towards PT Goals Progress towards PT goals: Progressing toward goals  Plan Current plan remains appropriate    Precautions / Restrictions Precautions Precautions: Fall Restrictions Other Position/Activity Restrictions: WBAT   Pertinent Vitals/Pain More sore today but tol    Mobility  Bed Mobility Bed Mobility: Supine to Sit Supine to Sit: 5: Supervision Details for Bed Mobility Assistance: pt self assists RLE Transfers Transfers: Stand to Sit;Sit to Stand Sit to Stand: 5: Supervision;6: Modified independent (Device/Increase time) Stand to Sit: 5: Supervision;6: Modified independent (Device/Increase time) Ambulation/Gait Ambulation/Gait Assistance: 5: Supervision;6: Modified independent (Device/Increase time) Ambulation Distance (Feet): 250 Feet Assistive device: Rolling walker;Crutches Ambulation/Gait Assistance Details: cues for crutch technique; pt to walk with RW at home until HHPT chnages DME Gait Pattern: Step-through pattern Stairs: Yes Stairs Assistance: 4: Min assist;4: Min guard Stairs Assistance Details (indicate cue type and reason): cues for sequence Stair Management Technique: Step to pattern;Forwards;With crutches Number of Stairs: 5 (x2)    Exercises Total Joint Exercises Ankle Circles/Pumps: AROM;Both;10 reps Quad Sets: AROM;Right;10 reps Heel Slides: AROM;AAROM;Right;5 reps Long Arc Quad: AROM;10  reps;Right   PT Diagnosis:    PT Problem List:   PT Treatment Interventions:     PT Goals (current goals can now be found in the care plan section) Acute Rehab PT Goals Patient Stated Goal: return to I Time For Goal Achievement: 05/16/13 Potential to Achieve Goals: Good  Visit Information  Last PT Received On: 05/13/13 Assistance Needed: +1 History of Present Illness: s/p right DA THA    Subjective Data  Patient Stated Goal: return to I   Cognition  Cognition Arousal/Alertness: Awake/alert Behavior During Therapy: WFL for tasks assessed/performed Overall Cognitive Status: Within Functional Limits for tasks assessed    Balance     End of Session PT - End of Session Activity Tolerance: Patient tolerated treatment well Patient left: in chair;with call bell/phone within reach;with family/visitor present   GP     [facility] 05/13/2013, 2:06 PM

## 2013-05-13 NOTE — Discharge Summary (Signed)
Physician Discharge Summary   Patient ID: Tanner [name] MRN: [id number] DOB/AGE: [birth year] 68 y.o.  Admit date: 05/11/2013 Discharge date: 05/13/2013  Primary Diagnosis:  Osteonecrosis of the Right hip.   Admission Diagnoses:  Past Medical History  Diagnosis Date  . IBS (irritable bowel syndrome)   . Rosacea   . Depression   . ADD (attention deficit disorder)   . Esophageal reflux   . BPH (benign prostatic hypertrophy)   . OSA (obstructive sleep apnea)   . Arthritis   . Cancer     basal skin    Discharge Diagnoses:   Principal Problem:   Avascular necrosis of hip Active Problems:   OA (osteoarthritis) of hip  Estimated body mass index is 26.16 kg/(m^2) as calculated from the following:   Height as of this encounter: 5' 10.5" (1.791 m).   Weight as of this encounter: 83.915 kg (185 lb).  Procedure(s) (LRB): RIGHT TOTAL HIP ARTHROPLASTY ANTERIOR APPROACH (Right)   Consults: None  HPI: Tanner [name] is a 67 y.o. 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   Laboratory Data: Admission on 05/11/2013, Discharged on 05/13/2013  Component Date Value Range Status  . ABO/RH(D) 05/11/2013 O NEG   Final  . Antibody Screen 05/11/2013 NEG   Final  . Sample Expiration 05/11/2013 05/14/2013   Final  . ABO/RH(D) 05/11/2013 O NEG   Final  . WBC 05/12/2013 11.3* 4.0 - 10.5 K/uL Final  . RBC 05/12/2013 4.04* 4.22 - 5.81 MIL/uL Final  . Hemoglobin 05/12/2013 13.0  13.0 - 17.0 g/dL Final  . HCT 78/29/5621 38.9* 39.0 - 52.0 % Final  . MCV 05/12/2013 96.3  78.0 - 100.0 fL Final  . MCH 05/12/2013 32.2  26.0 - 34.0 pg Final  . MCHC 05/12/2013 33.4  30.0 - 36.0 g/dL Final  . RDW 30/86/5784 13.2  11.5 - 15.5 % Final  . Platelets 05/12/2013 204  150 - 400 K/uL Final  . Sodium 05/12/2013 138  135 - 145 mEq/L Final  . Potassium  05/12/2013 4.5  3.5 - 5.1 mEq/L Final  . Chloride 05/12/2013 104  96 - 112 mEq/L Final  . CO2 05/12/2013 25  19 - 32 mEq/L Final  . Glucose, Bld 05/12/2013 169* 70 - 99 mg/dL Final  . BUN 69/62/9528 13  6 - 23 mg/dL Final  . Creatinine, Ser 05/12/2013 0.98  0.50 - 1.35 mg/dL Final  . Calcium 41/32/4401 8.6  8.4 - 10.5 mg/dL Final  . GFR calc non Af Amer 05/12/2013 83* >90 mL/min Final  . GFR calc Af Amer 05/12/2013 >90  >90 mL/min Final   Comment: (NOTE)                          The eGFR has been calculated using the CKD EPI equation.                          This calculation has not been validated in all clinical situations.                          eGFR's persistently <90 mL/min signify possible Chronic Kidney  Disease.  . WBC 05/13/2013 13.8* 4.0 - 10.5 K/uL Final  . RBC 05/13/2013 3.94* 4.22 - 5.81 MIL/uL Final  . Hemoglobin 05/13/2013 12.7* 13.0 - 17.0 g/dL Final  . HCT 16/03/9603 38.0* 39.0 - 52.0 % Final  . MCV 05/13/2013 96.4  78.0 - 100.0 fL Final  . MCH 05/13/2013 32.2  26.0 - 34.0 pg Final  . MCHC 05/13/2013 33.4  30.0 - 36.0 g/dL Final  . RDW 54/01/8118 13.6  11.5 - 15.5 % Final  . Platelets 05/13/2013 192  150 - 400 K/uL Final  . Sodium 05/13/2013 139  135 - 145 mEq/L Final  . Potassium 05/13/2013 4.0  3.5 - 5.1 mEq/L Final  . Chloride 05/13/2013 105  96 - 112 mEq/L Final  . CO2 05/13/2013 27  19 - 32 mEq/L Final  . Glucose, Bld 05/13/2013 119* 70 - 99 mg/dL Final  . BUN 14/78/2956 16  6 - 23 mg/dL Final  . Creatinine, Ser 05/13/2013 0.99  0.50 - 1.35 mg/dL Final  . Calcium 21/30/8657 8.8  8.4 - 10.5 mg/dL Final  . GFR calc non Af Amer 05/13/2013 82* >90 mL/min Final  . GFR calc Af Amer 05/13/2013 >90  >90 mL/min Final   Comment: (NOTE)                          The eGFR has been calculated using the CKD EPI equation.                          This calculation has not been validated in all clinical situations.                          eGFR's  persistently <90 mL/min signify possible Chronic Kidney                          Disease.  Hospital Outpatient Visit on 05/03/2013  Component Date Value Range Status  . MRSA, PCR 05/03/2013 NEGATIVE  NEGATIVE Final  . Staphylococcus aureus 05/03/2013 NEGATIVE  NEGATIVE Final   Comment:                                 The Xpert SA Assay (FDA                          approved for NASAL specimens                          in patients over 38 years of age),                          is one component of                          a comprehensive surveillance                          program.  Test performance has                          been validated by [employer]  [facility] for patients greater                          than or equal to 68 year old.                          It is not intended                          to diagnose infection nor to                          guide or monitor treatment.  Marland Kitchen aPTT 05/03/2013 29  24 - 37 seconds Final  . WBC 05/03/2013 5.0  4.0 - 10.5 K/uL Final  . RBC 05/03/2013 4.81  4.22 - 5.81 MIL/uL Final  . Hemoglobin 05/03/2013 15.6  13.0 - 17.0 g/dL Final  . HCT 16/03/9603 46.6  39.0 - 52.0 % Final  . MCV 05/03/2013 96.9  78.0 - 100.0 fL Final  . MCH 05/03/2013 32.4  26.0 - 34.0 pg Final  . MCHC 05/03/2013 33.5  30.0 - 36.0 g/dL Final  . RDW 54/01/8118 13.3  11.5 - 15.5 % Final  . Platelets 05/03/2013 215  150 - 400 K/uL Final  . Sodium 05/03/2013 139  135 - 145 mEq/L Final  . Potassium 05/03/2013 4.7  3.5 - 5.1 mEq/L Final  . Chloride 05/03/2013 104  96 - 112 mEq/L Final  . CO2 05/03/2013 28  19 - 32 mEq/L Final  . Glucose, Bld 05/03/2013 90  70 - 99 mg/dL Final  . BUN 14/78/2956 23  6 - 23 mg/dL Final  . Creatinine, Ser 05/03/2013 1.23  0.50 - 1.35 mg/dL Final  . Calcium 21/30/8657 9.5  8.4 - 10.5 mg/dL Final  . Total Protein 05/03/2013 7.0  6.0 - 8.3 g/dL Final  . Albumin 84/69/6295 3.8  3.5 - 5.2 g/dL Final  . AST 28/41/3244 38* 0 -  37 U/L Final  . ALT 05/03/2013 23  0 - 53 U/L Final  . Alkaline Phosphatase 05/03/2013 54  39 - 117 U/L Final  . Total Bilirubin 05/03/2013 0.9  0.3 - 1.2 mg/dL Final  . GFR calc non Af Amer 05/03/2013 59* >90 mL/min Final  . GFR calc Af Amer 05/03/2013 68* >90 mL/min Final   Comment: (NOTE)                          The eGFR has been calculated using the CKD EPI equation.                          This calculation has not been validated in all clinical situations.                          eGFR's persistently <90 mL/min signify possible Chronic Kidney                          Disease.  Marland Kitchen Prothrombin Time 05/03/2013 11.9  11.6 - 15.2 seconds Final  . INR 05/03/2013 0.89  0.00 - 1.49 Final  . Color, Urine 05/03/2013 YELLOW  YELLOW Final  . APPearance 05/03/2013 CLEAR  CLEAR Final  . Specific Gravity, Urine 05/03/2013 1.016  1.005 - 1.030 Final  .  pH 05/03/2013 5.5  5.0 - 8.0 Final  . Glucose, UA 05/03/2013 NEGATIVE  NEGATIVE mg/dL Final  . Hgb urine dipstick 05/03/2013 NEGATIVE  NEGATIVE Final  . Bilirubin Urine 05/03/2013 NEGATIVE  NEGATIVE Final  . Ketones, ur 05/03/2013 NEGATIVE  NEGATIVE mg/dL Final  . Protein, ur 24/40/1027 NEGATIVE  NEGATIVE mg/dL Final  . Urobilinogen, UA 05/03/2013 0.2  0.0 - 1.0 mg/dL Final  . Nitrite 25/36/6440 NEGATIVE  NEGATIVE Final  . Leukocytes, UA 05/03/2013 NEGATIVE  NEGATIVE Final   MICROSCOPIC NOT DONE ON URINES WITH NEGATIVE PROTEIN, BLOOD, LEUKOCYTES, NITRITE, OR GLUCOSE <1000 mg/dL.     X-Rays:Dg Chest 2 View  04/15/2013   CLINICAL DATA:  67 year old male preoperative study for hip surgery. History of sleep apnea. Initial encounter.  EXAM: CHEST  2 VIEW  COMPARISON:  None.  FINDINGS: Lung volumes are within normal limits. Normal cardiac size and mediastinal contours. Visualized tracheal air column is within normal limits. The lungs are clear. No acute osseous abnormality identified.  IMPRESSION: No acute cardiopulmonary abnormality.   Electronically  Signed   By: Augusto Gamble M.D.   On: 04/15/2013 13:41   Dg Hip Complete Right  05/03/2013   CLINICAL DATA:  Avascular necrosis of the hips.  EXAM: RIGHT HIP - COMPLETE 2+ VIEW  COMPARISON:  MRI 10/27/2012.  FINDINGS: Degenerative changes of the lumbar spine, both SI joints, both hips. Sclerotic changes noted of the femoral heads consistent with the patient's prior diagnosis of avascular necrosis. No evidence of hip fracture or acute bony abnormality. Calcifications in pelvis consistent with phleboliths.  IMPRESSION: 1. Bilateral hip AVN. 2. Degenerative changes lumbar spine, both SI joints, both hips.   Electronically Signed   By: Maisie Fus  Register   On: 05/03/2013 10:06   Dg Pelvis Portable  05/11/2013   CLINICAL DATA:  Postop right total hip replacement  EXAM: PORTABLE PELVIS 1-2 VIEWS  COMPARISON:  Pelvis MRI -10/27/2012  FINDINGS: Post right total hip replacement. Alignment appears near anatomic on this AP portable examination. No definite evidence of hardware failure or loosening. No definite a surgical drain overlies the superior lateral aspect of the operative site. There is expected subcutaneous emphysema about the operative site.  There is minimal increased sclerosis of the weight-bearing surface of the left femoral head as better demonstrated on recently performed pelvic MRI. Multiple phleboliths overlie the lower pelvis.  IMPRESSION: 1. Post right total hip replacement without evidence of complication.  2. Left-sided femoral head avascular necrosis, as better demonstrated on recently performed pelvic MRI.   Electronically Signed   By: Simonne Come M.D.   On: 05/11/2013 13:30   Dg C-arm 1-60 Min-no Report  05/11/2013   CLINICAL DATA: RIGHT ANTERIOR HIP   C-ARM 1-60 MINUTES  Fluoroscopy was utilized by the requesting physician.  No radiographic  interpretation.     EKG:No orders found for this or any previous visit.   Hospital Course: Patient was admitted to [facility] and taken to the  OR and underwent the above state procedure without complications.  Patient tolerated the procedure well and was later transferred to the recovery room and then to the orthopaedic floor for postoperative care.  They were given PO and IV analgesics for pain control following their surgery.  They were given 24 hours of postoperative antibiotics of  Anti-infectives   Start     Dose/Rate Route Frequency Ordered Stop   05/11/13 1700  ceFAZolin (ANCEF) IVPB 1 g/50 mL premix     1 g  100 mL/hr over 30 Minutes Intravenous Every 6 hours 05/11/13 1525 05/11/13 2337   05/11/13 0845  ceFAZolin (ANCEF) IVPB 2 g/50 mL premix     2 g 100 mL/hr over 30 Minutes Intravenous On call to O.R. 05/11/13 5409 05/11/13 1101     and started on DVT prophylaxis in the form of Xarelto.   PT and OT were ordered for total hip protocol.  The patient was allowed to be WBAT with therapy. Discharge planning was consulted to help with postop disposition and equipment needs.  Patient had a tough night on the evening of surgery with not much rest.  They started to get up OOB with therapy on day one.  Hemovac drain was pulled without difficulty.  Continued to work with therapy into day two.  Dressing was changed on day two and the incision was healing well. Patient was seen in rounds and was ready to go home.  Discharge home with home health  Diet - Regular diet  Follow up - in 2 weeks  Activity - WBAT  Disposition - Home  Condition Upon Discharge - Good  D/C Meds - See DC Summary  DVT Prophylaxis - Xarelto       Discharge Orders   Future Orders Complete By Expires   Call MD / Call 911  As directed    Comments:     If you experience chest pain or shortness of breath, CALL 911 and be transported to the hospital emergency room.  If you develope a fever above 101 F, pus (white drainage) or increased drainage or redness at the wound, or calf pain, call your surgeon's office.   Change dressing  As directed    Comments:     You may  change your dressing dressing daily with sterile 4 x 4 inch gauze dressing and paper tape.  Do not submerge the incision under water.   Constipation Prevention  As directed    Comments:     Drink plenty of fluids.  Prune juice may be helpful.  You may use a stool softener, such as Colace (over the counter) 100 mg twice a day.  Use MiraLax (over the counter) for constipation as needed.   Diet general  As directed    Discharge instructions  As directed    Comments:     Pick up stool softner and laxative for home. Do not submerge incision under water. May shower. Continue to use ice for pain and swelling from surgery.  Total Hip Protocol.  Take Xarelto for two and a half more weeks, then discontinue Xarelto. Once the patient has completed the Xarelto, they may resume the 81 mg Aspirin.   Do not sit on low chairs, stoools or toilet seats, as it may be difficult to get up from low surfaces  As directed    Driving restrictions  As directed    Comments:     No driving until released by the physician.   Increase activity slowly as tolerated  As directed    Lifting restrictions  As directed    Comments:     No lifting until released by the physician.   Patient may shower  As directed    Comments:     You may shower without a dressing once there is no drainage.  Do not wash over the wound.  If drainage remains, do not shower until drainage stops.   TED hose  As directed    Comments:     Use stockings (  TED hose) for 3 weeks on both leg(s).  You may remove them at night for sleeping.   Weight bearing as tolerated  As directed    Questions:     Laterality:     Extremity:         Medication List    STOP taking these medications       aspirin 81 MG tablet     cholecalciferol 1000 UNITS tablet  Commonly known as:  VITAMIN D     fish oil-omega-3 fatty acids 1000 MG capsule     FLUARIX QUADRIVALENT 0.5 ML injection  Generic drug:  influenza vac split quadrivalent PF     ibuprofen 200  MG tablet  Commonly known as:  ADVIL,MOTRIN     minocycline 100 MG capsule  Commonly known as:  MINOCIN,DYNACIN     sildenafil 100 MG tablet  Commonly known as:  VIAGRA     sodium chloride 0.65 % nasal spray  Commonly known as:  OCEAN      TAKE these medications       amphetamine-dextroamphetamine 30 MG 24 hr capsule  Commonly known as:  ADDERALL XR  Take 30 mg by mouth daily.     cetirizine 10 MG tablet  Commonly known as:  ZYRTEC  Take 10 mg by mouth daily.     methocarbamol 500 MG tablet  Commonly known as:  ROBAXIN  Take 1 tablet (500 mg total) by mouth every 6 (six) hours as needed for muscle spasms.     metroNIDAZOLE 0.75 % cream  Commonly known as:  METROCREAM  Apply 1 application topically every other day. Alternates with tretinoin.     mometasone 0.1 % cream  Commonly known as:  ELOCON  Apply 1 application topically daily as needed. Itchy ears     omeprazole 40 MG capsule  Commonly known as:  PRILOSEC  Take 40 mg by mouth daily.     oxyCODONE 5 MG immediate release tablet  Commonly known as:  Oxy IR/ROXICODONE  Take 1-2 tablets (5-10 mg total) by mouth every 3 (three) hours as needed for breakthrough pain.     rivaroxaban 10 MG Tabs tablet  Commonly known as:  XARELTO  - Take 1 tablet (10 mg total) by mouth daily with breakfast. Take Xarelto for two and a half more weeks, then discontinue Xarelto.  - Once the patient has completed the Xarelto, they may resume the 81 mg Aspirin.     simvastatin 10 MG tablet  Commonly known as:  ZOCOR  Take 10 mg by mouth every evening.     traMADol 50 MG tablet  Commonly known as:  ULTRAM  Take 1-2 tablets (50-100 mg total) by mouth every 6 (six) hours as needed (mild pain).     tretinoin 0.025 % cream  Commonly known as:  RETIN-A  Apply 1 application topically every other day. Alternates with metro cream     Vilazodone HCl 20 MG Tabs  Take 20 mg by mouth daily.       Follow-up Information   Follow up with  Loanne Drilling, MD. Schedule an appointment as soon as possible for a visit on 05/24/2013.   Specialty:  Orthopedic Surgery   Contact information:   [address] [place] [address] [phone]       Signed: Patrica Duel 05/17/2013, 5:20 PM

## 2013-05-13 NOTE — Progress Notes (Signed)
Pt stable, scripts, d/c instructions given with no questions/concerns voiced by pt or family.  Pt transported via wheelchair to private vehicle by NT and family. 

## 2013-05-13 NOTE — Care Management Note (Signed)
    Page 1 of 2   05/13/2013     12:15:51 PM   CARE MANAGEMENT NOTE 05/13/2013  Patient:  AVI, KERSCHNER   Account Number:  [id number]  Date Initiated:  05/12/2013  Documentation initiated by:  Colleen Can  Subjective/Objective Assessment:   dx avascular necrosis rt hip; total hip replacemnt-anterir approach.    Pre-arranged with Genevieve Norlander to provide [facility] services.     Action/Plan:   CM spoke with patient. Plans arefor him to return to his home where friend will be caregiver. He will need RW. Already has shower chair, and commode seat.  Genevieve Norlander will provide [facility] services.   Anticipated DC Date:  05/13/2013   Anticipated DC Plan:  HOME W HOME HEALTH SERVICES  In-house referral  Clinical Social Worker      DC Planning Services  CM consult      PAC Choice  DURABLE MEDICAL EQUIPMENT  HOME HEALTH   Choice offered to / List presented to:  C-1 Patient   DME arranged  Levan Hurst      DME agency  [facility]     HH arranged  HH-2 PT      [facility] agency  [facility]   Status of service:  Completed, signed off Medicare Important Message given?  NA - LOS <3 / Initial given by admissions (If response is "NO", the following Medicare IM given date fields will be blank) Date Medicare IM given:   Date Additional Medicare IM given:    Discharge Disposition:  HOME W HOME HEALTH SERVICES  Per UR Regulation:    If discussed at Long Length of Stay Meetings, dates discussed:    Comments:  05/13/2013 Colleen Can BSN RN CCM [phone] Pt discharged with [facility] and services will start tomorrow

## 2013-05-23 ENCOUNTER — Ambulatory Visit
Admission: RE | Admit: 2013-05-23 | Discharge: 2013-05-23 | Disposition: A | Discharge status: Home / Self Care | Payer: Medicare Other | Source: Ambulatory Visit | Attending: Family Medicine | Admitting: Family Medicine

## 2013-05-23 ENCOUNTER — Other Ambulatory Visit: Payer: Self-pay | Admitting: Family Medicine

## 2013-05-23 DIAGNOSIS — R059 Cough, unspecified: Secondary | ICD-10-CM

## 2013-05-23 DIAGNOSIS — R05 Cough: Secondary | ICD-10-CM

## 2013-09-19 ENCOUNTER — Other Ambulatory Visit: Payer: Self-pay | Admitting: Dermatology

## 2013-10-28 ENCOUNTER — Encounter (INDEPENDENT_AMBULATORY_CARE_PROVIDER_SITE_OTHER): Payer: Self-pay | Admitting: Surgery

## 2013-10-28 ENCOUNTER — Ambulatory Visit (INDEPENDENT_AMBULATORY_CARE_PROVIDER_SITE_OTHER): Payer: Medicare Other | Admitting: Surgery

## 2013-10-28 VITALS — BP 122/74 | HR 72 | Temp 98.3°F | Resp 16 | Ht 70.5 in | Wt 179.0 lb

## 2013-10-28 DIAGNOSIS — T8140XA Infection following a procedure, unspecified, initial encounter: Secondary | ICD-10-CM

## 2013-10-28 DIAGNOSIS — R109 Unspecified abdominal pain: Secondary | ICD-10-CM | POA: Insufficient documentation

## 2013-10-28 DIAGNOSIS — T8149XA Infection following a procedure, other surgical site, initial encounter: Secondary | ICD-10-CM

## 2013-10-28 DIAGNOSIS — K429 Umbilical hernia without obstruction or gangrene: Secondary | ICD-10-CM

## 2013-10-28 NOTE — Progress Notes (Signed)
General Surgery [facility]  Chief Complaint  Patient presents with  . Follow-up    umbilical hernia repair - 03/01/2013 - having pain at site    HISTORY: Patient is a 68 year old male who underwent repair of umbilical hernia with mesh patch on 03/01/2013. Postoperative course was complicated by superficial cellulitis. He was treated with oral antibiotics with complete resolution.  Patient is now returned to normal physical activity. He complains of pain at the site of hernia repair and in the peri-umbilical area. This is exacerbated by his clothing. He notes pain with physical activity. He has not seen any sign of hernia recurrence. He denies any signs or symptoms of intestinal obstruction.  PERTINENT REVIEW OF SYSTEMS: Denies signs or symptoms of obstruction. Pain in. Umbilical region particularly with physical activity. Palpable "nodule" at the umbilicus.  EXAM: HEENT: normocephalic; pupils equal and reactive; sclerae clear; dentition good; mucous membranes moist NECK:  symmetric on extension; no palpable anterior or posterior cervical lymphadenopathy; no supraclavicular masses; no tenderness CHEST: clear to auscultation bilaterally without rales, rhonchi, or wheezes CARDIAC: regular rate and rhythm without significant murmur; peripheral pulses are full ABDOMEN: Soft nontender without distention; surgical incision below umbilicus is well healed; no erythema; with Valsalva there is no sign of hernia recurrence; remainder of the abdomen is soft and nontender without palpable mass or tenderness EXT:  non-tender without edema; no deformity NEURO: no gross focal deficits; no sign of tremor   IMPRESSION: Abdominal pain centered at the umbilicus following umbilical hernia repair  PLAN: I discussed the above findings with the patient and his wife. I explained that on examination today I can find no complications from his umbilical hernia repair. There is no sign of  infection. There is no sign of recurrence. The remainder of his abdominal examination is benign.  I have offered to proceed with further evaluation to include a CT scan of the abdomen and pelvis. This will give [place] the best assessment of his hernia repair and the position of the mesh in the preperitoneal space. They agree. I will contact him with the results when they are available.  Earnstine Regal, MD, [facility] Office: [phone]  Visit Diagnoses: 1. Umbilical hernia, reducible   2. Postoperative wound infection   3. Abdominal pain, periumbilical

## 2013-10-28 NOTE — Addendum Note (Signed)
Addended by: Carlene Coria on: 10/28/2013 04:03 PM   Modules accepted: Orders

## 2013-11-02 ENCOUNTER — Ambulatory Visit
Admission: RE | Admit: 2013-11-02 | Discharge: 2013-11-02 | Disposition: A | Discharge status: Home / Self Care | Payer: Medicare Other | Source: Ambulatory Visit | Attending: Surgery | Admitting: Surgery

## 2013-11-02 ENCOUNTER — Ambulatory Visit
Admission: RE | Admit: 2013-11-02 | Discharge: 2013-11-02 | Disposition: A | Discharge status: Home / Self Care | Payer: 59 | Source: Ambulatory Visit | Attending: Surgery | Admitting: Surgery

## 2013-11-02 ENCOUNTER — Other Ambulatory Visit (INDEPENDENT_AMBULATORY_CARE_PROVIDER_SITE_OTHER): Payer: Self-pay | Admitting: Surgery

## 2013-11-02 DIAGNOSIS — R109 Unspecified abdominal pain: Secondary | ICD-10-CM

## 2013-11-02 DIAGNOSIS — T8149XA Infection following a procedure, other surgical site, initial encounter: Secondary | ICD-10-CM

## 2013-11-02 DIAGNOSIS — K429 Umbilical hernia without obstruction or gangrene: Secondary | ICD-10-CM

## 2013-11-02 MED ORDER — IOHEXOL 300 MG/ML  SOLN
100.0000 mL | Freq: Once | INTRAMUSCULAR | Status: AC | PRN
  Administered 2013-11-02: 100 mL via INTRAVENOUS

## 2013-11-04 ENCOUNTER — Telehealth (INDEPENDENT_AMBULATORY_CARE_PROVIDER_SITE_OTHER): Payer: Self-pay

## 2013-11-04 NOTE — Telephone Encounter (Signed)
Pt was calling back to see if Dr Harlow Asa had gotten/seen the results from his CT scan. Informed pt that I would send Dr Harlow Asa a message and let him know that he was calling concerning results. Pt would like someone to give him a call back.  Pt verbalized understanding and agrees with POC.

## 2013-11-07 NOTE — Telephone Encounter (Signed)
I called pt and read his ct result to him. I advised pt that once Dr Harlow Asa has reviewed the result he or I will call him if there are any concerns. Pt states he understands and agrees with this plan.

## 2013-11-08 NOTE — Progress Notes (Signed)
Quick Note:  Telephone call to patient with results of CT scan. No apparent complication or problem or recurrennce of umbilical hernia repair with mesh. Nothing to explain his abdominal complaints.  However, the urinary bladder is quite large and extends to nearly the umbilicus.  On discussion, patient has been having some urinary symptoms. He has taken medication for urinary retention in the distant past. He states he does not have a urologist. There are some changes in the prostate evident on the CT scan.  I will recommend to Dr. Mechele Collin that the patient be referred to a urologist of his choice for further testing and treatment.  I will be happy to see the patient back for surgical care as needed.  tmg  Earnstine Regal, MD, [facility] Office: [phone]   ______

## 2014-05-24 ENCOUNTER — Telehealth: Payer: Self-pay | Admitting: Pulmonary Disease

## 2014-05-24 NOTE — Telephone Encounter (Signed)
Pt calling states that he was cleaning out his house and came across his old CPAP that he has not used. Pt states that it is practically brand new, has not been used very much. Wants to know if there is somewhere that takes refurbished machines so that a patient that needs a machine can get one. Please advise [facility] if you know of somewhere that will take his machine. Thanks.

## 2014-05-24 NOTE — Telephone Encounter (Signed)
Pt advised. Jennifer Castillo, CMA  

## 2014-05-24 NOTE — Telephone Encounter (Signed)
He can bring it to me and i will donate it to a dme Joellen [place]

## 2014-07-30 IMAGING — CR DG CHEST 2V
2 series · 2 of 2 positions shown · non-contrast
Comparison: 04/15/2013

CLINICAL DATA: Cough and chest congestion. Hip surgery 2 weeks ago.

EXAM:
CHEST  2 VIEW

[w chest pa]
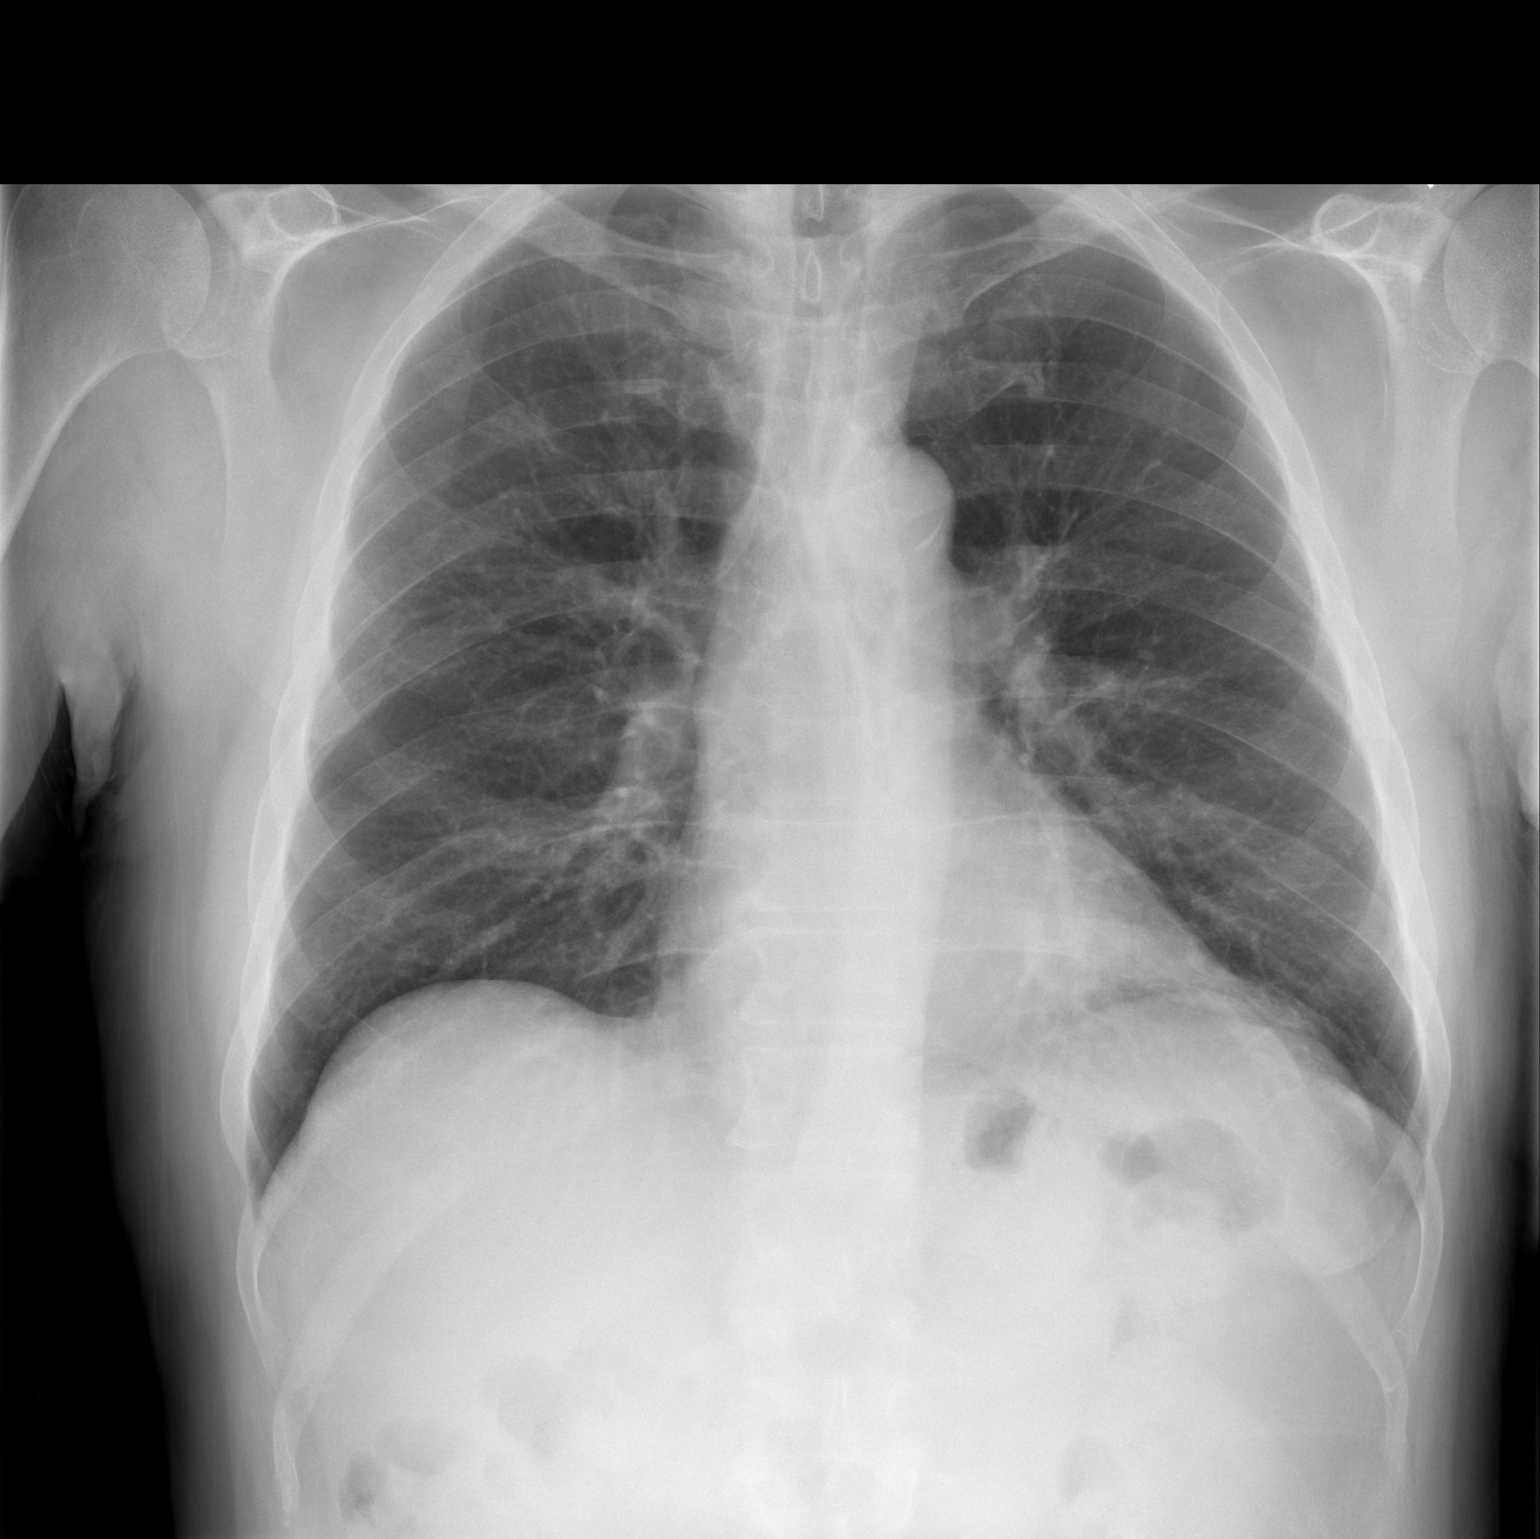

[w chest lat]
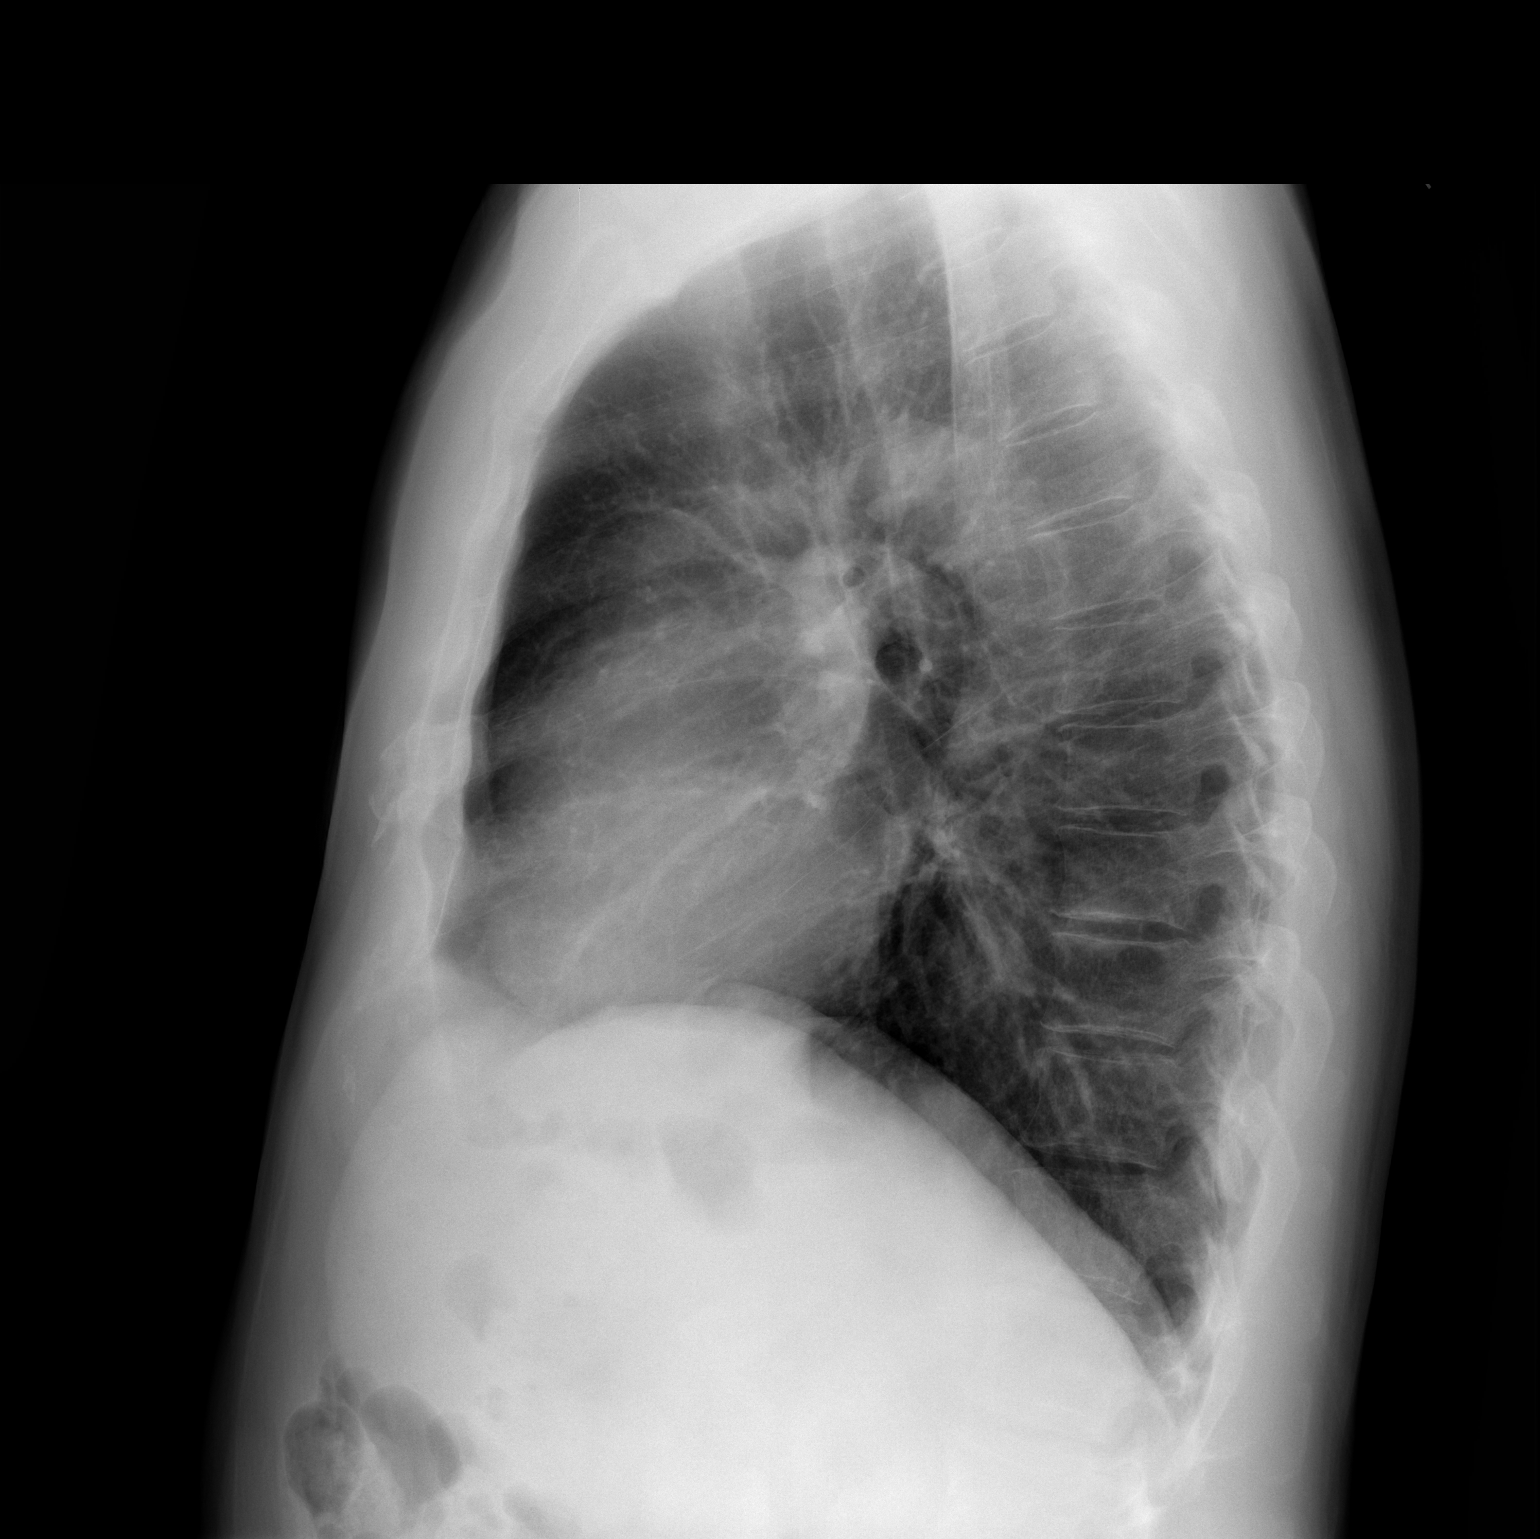

[2 of 2 positions shown; findings below may reference images not displayed]

FINDINGS: Heart size and pulmonary vascularity are normal and the lungs are
clear. No osseous abnormality.
IMPRESSION: Normal exam.

## 2014-08-29 ENCOUNTER — Ambulatory Visit
Admission: RE | Admit: 2014-08-29 | Discharge: 2014-08-29 | Disposition: A | Discharge status: Home / Self Care | Payer: Medicare Other | Source: Ambulatory Visit | Attending: Family Medicine | Admitting: Family Medicine

## 2014-08-29 ENCOUNTER — Other Ambulatory Visit: Payer: Self-pay | Admitting: Family Medicine

## 2014-08-29 DIAGNOSIS — Z01818 Encounter for other preprocedural examination: Secondary | ICD-10-CM

## 2014-09-05 ENCOUNTER — Ambulatory Visit: Payer: Self-pay | Admitting: Orthopedic Surgery

## 2014-09-05 NOTE — Progress Notes (Signed)
Preoperative surgical orders have been place into the Epic hospital system for Tanner [name] on 09/05/2014, 12:42 PM  by Mickel Crow for surgery on 09-18-2014.  Preop Total Hip - Anterior Approach orders including Experel Injecion, IV Tylenol, and IV Decadron as long as there are no contraindications to the above medications. Arlee Muslim, PA-C

## 2014-09-14 ENCOUNTER — Inpatient Hospital Stay (HOSPITAL_COMMUNITY)
Admission: RE | Admit: 2014-09-14 | Discharge: 2014-09-14 | Disposition: A | Discharge status: Home / Self Care | Payer: Medicare Other | Source: Ambulatory Visit

## 2014-09-14 NOTE — Patient Instructions (Addendum)
Aaronjames Kelsay  09/14/2014   Your procedure is scheduled on: Monday 09/18/2014  Report to [facility] Main  Entrance and follow signs to               [place] at 1150 AM.  Call this number if you have problems the morning of surgery [phone]   Remember:  Do not eat food  :After Midnight.   MAY HAVE CLEAR LIQUIDS FROM MIDNIGHT UP UNTIL 0850 AM THEN NOTHING UNTIL AFTER SURGERY!     Take these medicines the morning of surgery with A SIP OF WATER: PRILOSEC, VILAZODONE                               You may not have any metal on your body including hair pins and              piercings  Do not wear jewelry, make-up, lotions, powders or perfumes.             Do not wear nail polish.  Do not shave  48 hours prior to surgery.              Men may shave face and neck.   Do not bring valuables to the hospital. [place].  Contacts, dentures or bridgework may not be worn into surgery.  Leave suitcase in the car. After surgery it may be brought to your room.     Patients discharged the day of surgery will not be allowed to drive home.  Name and phone number of your driver:  Special Instructions: N/A              Please read over the following fact sheets you were given: _____________________________________________________________________             [facility] - Preparing for Surgery Before surgery, you can play an important role.  Because skin is not sterile, your skin needs to be as free of germs as possible.  You can reduce the number of germs on your skin by washing with CHG (chlorahexidine gluconate) soap before surgery.  CHG is an antiseptic cleaner which kills germs and bonds with the skin to continue killing germs even after washing. Please DO NOT use if you have an allergy to CHG or antibacterial soaps.  If your skin becomes reddened/irritated stop using the CHG and inform your nurse when you  arrive at Short Stay. Do not shave (including legs and underarms) for at least 48 hours prior to the first CHG shower.  You may shave your face/neck. Please follow these instructions carefully:  1.  Shower with CHG Soap the night before surgery and the  morning of Surgery.  2.  If you choose to wash your hair, wash your hair first as usual with your  normal  shampoo.  3.  After you shampoo, rinse your hair and body thoroughly to remove the  shampoo.                           4.  Use CHG as you would any other liquid soap.  You can apply chg directly  to the skin and wash  Gently with a scrungie or clean washcloth.  5.  Apply the CHG Soap to your body ONLY FROM THE NECK DOWN.   Do not use on face/ open                           Wound or open sores. Avoid contact with eyes, ears mouth and genitals (private parts).                       Wash face,  Genitals (private parts) with your normal soap.             6.  Wash thoroughly, paying special attention to the area where your surgery  will be performed.  7.  Thoroughly rinse your body with warm water from the neck down.  8.  DO NOT shower/wash with your normal soap after using and rinsing off  the CHG Soap.                9.  Pat yourself dry with a clean towel.            10.  Wear clean pajamas.            11.  Place clean sheets on your bed the night of your first shower and do not  sleep with pets. Day of Surgery : Do not apply any lotions/deodorants the morning of surgery.  Please wear clean clothes to the hospital/surgery center.  FAILURE TO FOLLOW THESE INSTRUCTIONS MAY RESULT IN THE CANCELLATION OF YOUR SURGERY PATIENT SIGNATURE_________________________________  NURSE SIGNATURE__________________________________  ________________________________________________________________________   Adam Phenix  An incentive spirometer is a tool that can help keep your lungs clear and active. This tool measures how  well you are filling your lungs with each breath. Taking long deep breaths may help reverse or decrease the chance of developing breathing (pulmonary) problems (especially infection) following:  A long period of time when you are unable to move or be active. BEFORE THE PROCEDURE   If the spirometer includes an indicator to show your best effort, your nurse or respiratory therapist will set it to a desired goal.  If possible, sit up straight or lean slightly forward. Try not to slouch.  Hold the incentive spirometer in an upright position. INSTRUCTIONS FOR USE   Sit on the edge of your bed if possible, or sit up as far as you can in bed or on a chair.  Hold the incentive spirometer in an upright position.  Breathe out normally.  Place the mouthpiece in your mouth and seal your lips tightly around it.  Breathe in slowly and as deeply as possible, raising the piston or the ball toward the top of the column.  Hold your breath for 3-5 seconds or for as long as possible. Allow the piston or ball to fall to the bottom of the column.  Remove the mouthpiece from your mouth and breathe out normally.  Rest for a few seconds and repeat Steps 1 through 7 at least 10 times every 1-2 hours when you are awake. Take your time and take a few normal breaths between deep breaths.  The spirometer may include an indicator to show your best effort. Use the indicator as a goal to work toward during each repetition.  After each set of 10 deep breaths, practice coughing to be sure your lungs are clear. If you have an incision (the cut made at the time of surgery),  support your incision when coughing by placing a pillow or rolled up towels firmly against it. Once you are able to get out of bed, walk around indoors and cough well. You may stop using the incentive spirometer when instructed by your caregiver.  RISKS AND COMPLICATIONS  Take your time so you do not get dizzy or light-headed.  If you are in  pain, you may need to take or ask for pain medication before doing incentive spirometry. It is harder to take a deep breath if you are having pain. AFTER USE  Rest and breathe slowly and easily.  It can be helpful to keep track of a log of your progress. Your caregiver can provide you with a simple table to help with this. If you are using the spirometer at home, follow these instructions: [place] IF:   You are having difficultly using the spirometer.  You have trouble using the spirometer as often as instructed.  Your pain medication is not giving enough relief while using the spirometer.  You develop fever of 100.5 F (38.1 C) or higher. SEEK IMMEDIATE MEDICAL CARE IF:   You cough up bloody sputum that had not been present before.  You develop fever of 102 F (38.9 C) or greater.  You develop worsening pain at or near the incision site. MAKE SURE YOU:   Understand these instructions.  Will watch your condition.  Will get help right away if you are not doing well or get worse. Document Released: 09/29/2006 Document Revised: 08/11/2011 Document Reviewed: 11/30/2006 ExitCare Patient Information 2014 ExitCare, [place].   ________________________________________________________________________  WHAT IS A BLOOD TRANSFUSION? Blood Transfusion Information  A transfusion is the replacement of blood or some of its parts. Blood is made up of multiple cells which provide different functions.  Red blood cells carry oxygen and are used for blood loss replacement.  White blood cells fight against infection.  Platelets control bleeding.  Plasma helps clot blood.  Other blood products are available for specialized needs, such as hemophilia or other clotting disorders. BEFORE THE TRANSFUSION  Who gives blood for transfusions?   Healthy volunteers who are fully evaluated to make sure their blood is safe. This is blood bank blood. Transfusion therapy is the safest it has  ever been in the practice of medicine. Before blood is taken from a donor, a complete history is taken to make sure that person has no history of diseases nor engages in risky social behavior (examples are intravenous drug use or sexual activity with multiple partners). The donor's travel history is screened to minimize risk of transmitting infections, such as malaria. The donated blood is tested for signs of infectious diseases, such as HIV and hepatitis. The blood is then tested to be sure it is compatible with you in order to minimize the chance of a transfusion reaction. If you or a relative donates blood, this is often done in anticipation of surgery and is not appropriate for emergency situations. It takes many days to process the donated blood. RISKS AND COMPLICATIONS Although transfusion therapy is very safe and saves many lives, the main dangers of transfusion include:   Getting an infectious disease.  Developing a transfusion reaction. This is an allergic reaction to something in the blood you were given. Every precaution is taken to prevent this. The decision to have a blood transfusion has been considered carefully by your caregiver before blood is given. Blood is not given unless the benefits outweigh the risks. AFTER THE TRANSFUSION  Right after receiving a blood transfusion, you will usually feel much better and more energetic. This is especially true if your red blood cells have gotten low (anemic). The transfusion raises the level of the red blood cells which carry oxygen, and this usually causes an energy increase.  The nurse administering the transfusion will monitor you carefully for complications. HOME CARE INSTRUCTIONS  No special instructions are needed after a transfusion. You may find your energy is better. Speak with your caregiver about any limitations on activity for underlying diseases you may have. SEEK MEDICAL CARE IF:   Your condition is not improving after your  transfusion.  You develop redness or irritation at the intravenous (IV) site. SEEK IMMEDIATE MEDICAL CARE IF:  Any of the following symptoms occur over the next 12 hours:  Shaking chills.  You have a temperature by mouth above 102 F (38.9 C), not controlled by medicine.  Chest, back, or muscle pain.  People around you feel you are not acting correctly or are confused.  Shortness of breath or difficulty breathing.  Dizziness and fainting.  You get a rash or develop hives.  You have a decrease in urine output.  Your urine turns a dark color or changes to pink, red, or brown. Any of the following symptoms occur over the next 10 days:  You have a temperature by mouth above 102 F (38.9 C), not controlled by medicine.  Shortness of breath.  Weakness after normal activity.  The white part of the eye turns yellow (jaundice).  You have a decrease in the amount of urine or are urinating less often.  Your urine turns a dark color or changes to pink, red, or brown. Document Released: 05/16/2000 Document Revised: 08/11/2011 Document Reviewed: 01/03/2008 ExitCare Patient Information 2014 ExitCare, [place].  _______________________________________________________________________   CLEAR LIQUID DIET   Foods Allowed                                                                     Foods Excluded  Coffee and tea, regular and decaf                             liquids that you cannot  Plain Jell-O in any flavor                                             see through such as: Fruit ices (not with fruit pulp)                                     milk, soups, orange juice  Iced Popsicles                                    All solid food Carbonated beverages, regular and diet  Cranberry, grape and apple juices Sports drinks like Gatorade Lightly seasoned clear broth or consume(fat free) Sugar, honey syrup  Sample Menu Breakfast                                 Lunch                                     Supper Cranberry juice                    Beef broth                            Chicken broth Jell-O                                     Grape juice                           Apple juice Coffee or tea                        Jell-O                                      Popsicle                                                Coffee or tea                        Coffee or tea  _____________________________________________________________________

## 2014-09-14 NOTE — Progress Notes (Signed)
09/11/2014-labs from [place] at [place] on chart-Lipid panel and hemoglobin. 08/28/2014-CMP lab on chart from [place] at [place]. 08/28/2014-EKG from [place] at [place] on chart. 08/21/2014-Pre-operative medical clearance fromDr. Darrick Penna on chart.

## 2014-09-14 NOTE — Progress Notes (Signed)
Pt was scheduled for PAT appt for Thursday 09/14/2014 at 10:00am pt was not here at 10:15am. Called admitting pt not in admitting. Called pt at phone number listed pt stated he had written down his appt for Friday 4/15 at 10 am. Nurse clarified an offered for pt to try to come for this am. Pt stated he was unable to come at present time. New appt made for 9:00am on Friday 09/15/2014. Nurse instructed to arrive at 8:30am for the admitting process. Pt states he could not be here any sooner than 9:00 am. Appt verified for 9am on Friday 09/15/2014.

## 2014-09-15 ENCOUNTER — Encounter (HOSPITAL_COMMUNITY)
Admission: RE | Admit: 2014-09-15 | Discharge: 2014-09-15 | Disposition: A | Discharge status: Home / Self Care | Payer: Medicare Other | Source: Ambulatory Visit | Attending: Orthopedic Surgery | Admitting: Orthopedic Surgery

## 2014-09-15 ENCOUNTER — Encounter (HOSPITAL_COMMUNITY): Payer: Self-pay | Admitting: *Deleted

## 2014-09-15 DIAGNOSIS — M1612 Unilateral primary osteoarthritis, left hip: Secondary | ICD-10-CM

## 2014-09-15 DIAGNOSIS — Z01818 Encounter for other preprocedural examination: Secondary | ICD-10-CM

## 2014-09-15 LAB — CBC
HEMATOCRIT: 46.3 % (ref 39.0–52.0)
HEMOGLOBIN: 15.1 g/dL (ref 13.0–17.0)
MCH: 32.2 pg (ref 26.0–34.0)
MCHC: 32.6 g/dL (ref 30.0–36.0)
MCV: 98.7 fL (ref 78.0–100.0)
Platelets: 212 10*3/uL (ref 150–400)
RBC: 4.69 MIL/uL (ref 4.22–5.81)
RDW: 13.3 % (ref 11.5–15.5)
WBC: 4.8 10*3/uL (ref 4.0–10.5)

## 2014-09-15 LAB — APTT: APTT: 31 s (ref 24–37)

## 2014-09-15 LAB — URINALYSIS, ROUTINE W REFLEX MICROSCOPIC
Bilirubin Urine: NEGATIVE
GLUCOSE, UA: NEGATIVE mg/dL
Hgb urine dipstick: NEGATIVE
KETONES UR: NEGATIVE mg/dL
LEUKOCYTES UA: NEGATIVE
Nitrite: NEGATIVE
PH: 6 (ref 5.0–8.0)
Protein, ur: NEGATIVE mg/dL
Specific Gravity, Urine: 1.016 (ref 1.005–1.030)
Urobilinogen, UA: 0.2 mg/dL (ref 0.0–1.0)

## 2014-09-15 LAB — SURGICAL PCR SCREEN
MRSA, PCR: NEGATIVE
STAPHYLOCOCCUS AUREUS: NEGATIVE

## 2014-09-15 LAB — PROTIME-INR
INR: 0.95 (ref 0.00–1.49)
Prothrombin Time: 12.8 seconds (ref 11.6–15.2)

## 2014-09-17 ENCOUNTER — Ambulatory Visit: Payer: Self-pay | Admitting: Orthopedic Surgery

## 2014-09-17 NOTE — H&P (Signed)
Tanner [name] Dam DOB: 1946/02/22 Widowed / Language: [name] / Race: White Male Date of Admission:  09/18/2014 CC:  Left Hip Pain History of Present Illness The patient is a 69 year old male who comes in today for a preoperative History and Physical. The patient is scheduled for a left total hip arthroplasty (anterior approach) to be performed by Dr. Dione Plover. [name], MD at [facility] on 09-18-2014. The patient comes in postop from right total hip arthroplasty (anterior approach). The patient states that he has some pain and discomfort at that time. He is still having difficulty with his left hip as well which continues to get worse with time. The problem he has with the right hip is lateral pain on occasion. He stated with activity it tends to get worse. The left hip is becoming more problematic for him. He is now at a stage where he feels like needs to get it operated on because it is hurting him more. Risks and benefits of the surgery have been discussed with the patient and they elect to proceed with surgery.  There are on active contraindications to upcoming procedure such as ongoing infection or progressive neurological disease.  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 father Osteoarthritis mother and  grandmother mothers side Depression mother Cancer grandmother fathers side Drug / Alcohol Addiction father, grandfather mothers side and grandfather fathers side Kidney disease father Heart Disease father, grandfather mothers side and grandfather fathers side Hypertension First Degree Relatives. mother  Social History  Marital status widowed Tobacco use Never smoker. never smoker Drug/Alcohol Rehab (Currently) no Living situation live alone Tobacco / smoke exposure yes outdoors only Alcohol use current drinker; drinks beer, wine and hard liquor; 5-7 per week Illicit drug use no Pain Contract no Current work status retired Children 2 Post-Surgical Plans Copy of Drug/Alcohol Rehab (Previously) no  Medication History  Ibuprofen (200MG  Capsule, 1 (one) Oral) Active. Viibryd (20MG  Tablet, Oral) Active. Aspirin (81MG  Tablet, 1 (one) Oral) Active. Vitamin D (400UNIT Capsule, 1 (one) Oral) Active. (1000IU) Minocycline HCl (100MG  Capsule, Oral) Active. Omeprazole (40MG  Capsule DR, Oral) Active. Adderall (15MG  Tablet, Oral) Active. (30mg ) Simvastatin (10MG  Tablet, Oral) Active. Finasteride (Oral) Specific dose unknown - Active.  Past Surgical History  Tonsillectomy Rotator Cuff Repair right Foot Surgery bilateral Cataract Surgery right Arthroscopy of Shoulder right Inguinal Hernia Repair open: left Arthroscopy of Knee right Total Hip Replacement - Right Date: 05/2013. Anterior   Review of Systems General Not Present- Chills, Fatigue, Fever, Memory Loss, Night Sweats, Weight Gain and Weight Loss. Skin Not Present- Eczema, Hives, Itching, Lesions and Rash. HEENT Not Present- Dentures, Double Vision, Headache, Hearing Loss, Tinnitus and Visual Loss. Respiratory Not Present- Allergies, Chronic Cough, Coughing up blood, Shortness of breath at rest and Shortness of breath with exertion. Cardiovascular Not Present- Chest Pain, Difficulty Breathing  Lying Down, Murmur, Palpitations, Racing/skipping heartbeats and Swelling. Gastrointestinal Not Present- Abdominal Pain, Bloody Stool, Constipation, Diarrhea, Difficulty Swallowing, Heartburn, Jaundice, Loss of appetitie, Nausea and Vomiting. Male Genitourinary Not  Present- Blood in Urine, Discharge, Flank Pain, Incontinence, Painful Urination, Urgency, Urinary frequency, Urinary Retention, Urinating at Night and Weak urinary stream. Musculoskeletal Not Present- Back Pain, Joint Pain, Joint Swelling, Morning Stiffness, Muscle Pain, Muscle Weakness and Spasms. Neurological Not Present- Blackout spells, Difficulty with balance, Dizziness, Paralysis, Tremor and Weakness. Psychiatric Not Present- Insomnia.  Vitals  Weight: 174 lb Height: 70.5in Body Surface Area: 1.98 m Body Mass Index: 24.61 kg/m  BP: 122/78 (Sitting, Right Arm, Standard)  Physical Exam  General Mental Status -Alert, cooperative and good historian. General Appearance-pleasant, Not in acute distress. Orientation-Oriented X3. Build & Nutrition-Well nourished and Well developed.  Head and Neck Head-normocephalic, atraumatic . Neck Global Assessment - supple, no bruit auscultated on the right, no bruit auscultated on the left.  Eye Vision-Wears corrective lenses. Pupil - Bilateral-Regular and Round. Motion - Bilateral-EOMI.  Chest and Lung Exam Auscultation Breath sounds - clear at anterior chest wall and clear at posterior chest wall. Adventitious sounds - No Adventitious sounds.  Cardiovascular Auscultation Rhythm - Regular rate and rhythm. Heart Sounds - S1 WNL and S2 WNL. Murmurs & Other Heart Sounds - Auscultation of the heart reveals - No Murmurs.  Abdomen Palpation/Percussion Tenderness - Abdomen is non-tender to palpation. Rigidity (guarding) - Abdomen is soft. Auscultation Auscultation of the abdomen reveals - Bowel sounds normal.  Male Genitourinary Note: Not done, not  pertinent to present illness   Musculoskeletal Note: Well developed male, alert and oriented in no apparent distress. He is in excellent physical condition. He appears younger than his stated age.  His left hip can be flexed to 110, rotated in 30, out 40, abducted 40.  Assessment & Plan Osteoarthritis of left hip (M16.12) Note:Surgical Plans: Left Total Hip Repalcement - Anterior Approach  Disposition: Home  PCP: Dr. Darrick Penna  IV TXA  Anesthesia Issues: None  Arlee Muslim, PA-C

## 2014-09-18 ENCOUNTER — Inpatient Hospital Stay (HOSPITAL_COMMUNITY): Payer: Medicare Other | Admitting: Anesthesiology

## 2014-09-18 ENCOUNTER — Encounter (HOSPITAL_COMMUNITY)
Admission: RE | Disposition: A | Discharge status: Home Health | Payer: Self-pay | Source: Ambulatory Visit | Attending: Orthopedic Surgery

## 2014-09-18 ENCOUNTER — Inpatient Hospital Stay (HOSPITAL_COMMUNITY): Payer: Medicare Other

## 2014-09-18 ENCOUNTER — Inpatient Hospital Stay (HOSPITAL_COMMUNITY)
Admission: RE | Admit: 2014-09-18 | Discharge: 2014-09-21 | DRG: 470 | Disposition: A | Discharge status: Home Health | Payer: Medicare Other | Source: Ambulatory Visit | Attending: Orthopedic Surgery | Admitting: Orthopedic Surgery

## 2014-09-18 ENCOUNTER — Encounter (HOSPITAL_COMMUNITY): Payer: Self-pay | Admitting: *Deleted

## 2014-09-18 DIAGNOSIS — Z96641 Presence of right artificial hip joint: Secondary | ICD-10-CM | POA: Diagnosis present

## 2014-09-18 DIAGNOSIS — M169 Osteoarthritis of hip, unspecified: Secondary | ICD-10-CM | POA: Diagnosis present

## 2014-09-18 DIAGNOSIS — Z7982 Long term (current) use of aspirin: Secondary | ICD-10-CM | POA: Diagnosis not present

## 2014-09-18 DIAGNOSIS — Z96649 Presence of unspecified artificial hip joint: Secondary | ICD-10-CM

## 2014-09-18 DIAGNOSIS — M25552 Pain in left hip: Secondary | ICD-10-CM | POA: Diagnosis present

## 2014-09-18 DIAGNOSIS — Z79899 Other long term (current) drug therapy: Secondary | ICD-10-CM | POA: Diagnosis not present

## 2014-09-18 DIAGNOSIS — M1612 Unilateral primary osteoarthritis, left hip: Secondary | ICD-10-CM | POA: Diagnosis present

## 2014-09-18 DIAGNOSIS — G4731 Primary central sleep apnea: Secondary | ICD-10-CM | POA: Diagnosis present

## 2014-09-18 DIAGNOSIS — M87059 Idiopathic aseptic necrosis of unspecified femur: Secondary | ICD-10-CM | POA: Diagnosis present

## 2014-09-18 DIAGNOSIS — M87052 Idiopathic aseptic necrosis of left femur: Secondary | ICD-10-CM | POA: Diagnosis present

## 2014-09-18 DIAGNOSIS — Z01812 Encounter for preprocedural laboratory examination: Secondary | ICD-10-CM | POA: Diagnosis not present

## 2014-09-18 HISTORY — PX: TOTAL HIP ARTHROPLASTY: SHX124

## 2014-09-18 LAB — TYPE AND SCREEN
ABO/RH(D): O NEG
Antibody Screen: NEGATIVE

## 2014-09-18 SURGERY — ARTHROPLASTY, HIP, TOTAL, ANTERIOR APPROACH
Anesthesia: General | Site: Hip | Laterality: Left

## 2014-09-18 MED ORDER — BUPIVACAINE HCL (PF) 0.25 % IJ SOLN
INTRAMUSCULAR | Status: AC
  Filled 2014-09-18: qty 30

## 2014-09-18 MED ORDER — AMPHETAMINE-DEXTROAMPHET ER 20 MG PO CP24
30.0000 mg | ORAL_CAPSULE | Freq: Every morning | ORAL | Status: DC
  Administered 2014-09-19 – 2014-09-21 (×3): 30 mg via ORAL
  Filled 2014-09-18 (×6): qty 1

## 2014-09-18 MED ORDER — PHENYLEPHRINE 40 MCG/ML (10ML) SYRINGE FOR IV PUSH (FOR BLOOD PRESSURE SUPPORT)
PREFILLED_SYRINGE | INTRAVENOUS | Status: AC
  Filled 2014-09-18: qty 10

## 2014-09-18 MED ORDER — PROPOFOL 10 MG/ML IV BOLUS
INTRAVENOUS | Status: AC
  Filled 2014-09-18: qty 20

## 2014-09-18 MED ORDER — LACTATED RINGERS IV SOLN
INTRAVENOUS | Status: DC
  Administered 2014-09-18: 16:00:00 via INTRAVENOUS
  Administered 2014-09-18: 1000 mL via INTRAVENOUS

## 2014-09-18 MED ORDER — BUPIVACAINE LIPOSOME 1.3 % IJ SUSP
20.0000 mL | Freq: Once | INTRAMUSCULAR | Status: DC
  Administered 2014-09-18: 266 mg
  Filled 2014-09-18: qty 20

## 2014-09-18 MED ORDER — BISACODYL 10 MG RE SUPP: 10.0000 mg | Freq: Every day | RECTAL | Status: DC | PRN

## 2014-09-18 MED ORDER — OXYCODONE HCL 5 MG PO TABS
5.0000 mg | ORAL_TABLET | ORAL | Status: DC | PRN
  Administered 2014-09-18 (×3): 5 mg via ORAL
  Administered 2014-09-19: 10 mg via ORAL
  Administered 2014-09-19: 5 mg via ORAL
  Administered 2014-09-19: 10 mg via ORAL
  Administered 2014-09-19 (×3): 5 mg via ORAL
  Administered 2014-09-20 – 2014-09-21 (×4): 10 mg via ORAL
  Administered 2014-09-21: 5 mg via ORAL
  Filled 2014-09-18 (×2): qty 2
  Filled 2014-09-18 (×2): qty 1
  Filled 2014-09-18 (×2): qty 2
  Filled 2014-09-18: qty 1
  Filled 2014-09-18 (×2): qty 2
  Filled 2014-09-18: qty 1
  Filled 2014-09-18 (×4): qty 2
  Filled 2014-09-18: qty 1

## 2014-09-18 MED ORDER — MEPERIDINE HCL 50 MG/ML IJ SOLN: 6.2500 mg | INTRAMUSCULAR | Status: DC | PRN

## 2014-09-18 MED ORDER — EPHEDRINE SULFATE 50 MG/ML IJ SOLN
INTRAMUSCULAR | Status: DC | PRN
  Administered 2014-09-18: 10 mg via INTRAVENOUS

## 2014-09-18 MED ORDER — SODIUM CHLORIDE 0.9 % IV SOLN
INTRAVENOUS | Status: DC
  Administered 2014-09-18: 12:00:00 via INTRAVENOUS

## 2014-09-18 MED ORDER — ONDANSETRON HCL 4 MG PO TABS: 4.0000 mg | ORAL_TABLET | Freq: Four times a day (QID) | ORAL | Status: DC | PRN

## 2014-09-18 MED ORDER — ACETAMINOPHEN 500 MG PO TABS
1000.0000 mg | ORAL_TABLET | Freq: Four times a day (QID) | ORAL | Status: AC
  Administered 2014-09-18 – 2014-09-19 (×4): 1000 mg via ORAL
  Filled 2014-09-18 (×4): qty 2

## 2014-09-18 MED ORDER — FINASTERIDE 5 MG PO TABS
5.0000 mg | ORAL_TABLET | Freq: Every day | ORAL | Status: DC
  Administered 2014-09-19 – 2014-09-21 (×3): 5 mg via ORAL
  Filled 2014-09-18 (×3): qty 1

## 2014-09-18 MED ORDER — PHENOL 1.4 % MT LIQD: 1.0000 | OROMUCOSAL | Status: DC | PRN

## 2014-09-18 MED ORDER — SIMVASTATIN 10 MG PO TABS
10.0000 mg | ORAL_TABLET | Freq: Every evening | ORAL | Status: DC
  Administered 2014-09-18 – 2014-09-20 (×3): 10 mg via ORAL
  Filled 2014-09-18 (×4): qty 1

## 2014-09-18 MED ORDER — PANTOPRAZOLE SODIUM 40 MG PO TBEC
80.0000 mg | DELAYED_RELEASE_TABLET | Freq: Every day | ORAL | Status: DC
  Administered 2014-09-19 – 2014-09-20 (×2): 80 mg via ORAL
  Filled 2014-09-18 (×3): qty 2

## 2014-09-18 MED ORDER — 0.9 % SODIUM CHLORIDE (POUR BTL) OPTIME
TOPICAL | Status: DC | PRN
  Administered 2014-09-18: 1000 mL

## 2014-09-18 MED ORDER — CEFAZOLIN SODIUM-DEXTROSE 2-3 GM-% IV SOLR
INTRAVENOUS | Status: AC
  Filled 2014-09-18: qty 50

## 2014-09-18 MED ORDER — FLEET ENEMA 7-19 GM/118ML RE ENEM: 1.0000 | ENEMA | Freq: Once | RECTAL | Status: AC | PRN

## 2014-09-18 MED ORDER — PROPOFOL 10 MG/ML IV BOLUS
INTRAVENOUS | Status: DC | PRN
  Administered 2014-09-18: 150 mg via INTRAVENOUS
  Administered 2014-09-18: 50 mg via INTRAVENOUS

## 2014-09-18 MED ORDER — MENTHOL 3 MG MT LOZG: 1.0000 | LOZENGE | OROMUCOSAL | Status: DC | PRN

## 2014-09-18 MED ORDER — METOCLOPRAMIDE HCL 5 MG/ML IJ SOLN: 5.0000 mg | Freq: Three times a day (TID) | INTRAMUSCULAR | Status: DC | PRN

## 2014-09-18 MED ORDER — ONDANSETRON HCL 4 MG/2ML IJ SOLN
INTRAMUSCULAR | Status: DC | PRN
  Administered 2014-09-18: 4 mg via INTRAVENOUS

## 2014-09-18 MED ORDER — FENTANYL CITRATE (PF) 100 MCG/2ML IJ SOLN
INTRAMUSCULAR | Status: DC | PRN
  Administered 2014-09-18 (×2): 50 ug via INTRAVENOUS
  Administered 2014-09-18: 100 ug via INTRAVENOUS

## 2014-09-18 MED ORDER — ACETAMINOPHEN 325 MG PO TABS
650.0000 mg | ORAL_TABLET | Freq: Four times a day (QID) | ORAL | Status: DC | PRN
  Administered 2014-09-20: 650 mg via ORAL
  Filled 2014-09-18: qty 2

## 2014-09-18 MED ORDER — POLYETHYLENE GLYCOL 3350 17 G PO PACK: 17.0000 g | PACK | Freq: Every day | ORAL | Status: DC | PRN

## 2014-09-18 MED ORDER — DOCUSATE SODIUM 100 MG PO CAPS
100.0000 mg | ORAL_CAPSULE | Freq: Two times a day (BID) | ORAL | Status: DC
  Administered 2014-09-18 – 2014-09-21 (×6): 100 mg via ORAL

## 2014-09-18 MED ORDER — FLUTICASONE PROPIONATE 50 MCG/ACT NA SUSP
2.0000 | Freq: Every day | NASAL | Status: DC
  Administered 2014-09-20: 2 via NASAL
  Filled 2014-09-18: qty 16

## 2014-09-18 MED ORDER — DEXAMETHASONE SODIUM PHOSPHATE 10 MG/ML IJ SOLN
INTRAMUSCULAR | Status: AC
  Filled 2014-09-18: qty 1

## 2014-09-18 MED ORDER — ONDANSETRON HCL 4 MG/2ML IJ SOLN
INTRAMUSCULAR | Status: AC
  Filled 2014-09-18: qty 2

## 2014-09-18 MED ORDER — MIDAZOLAM HCL 2 MG/2ML IJ SOLN
INTRAMUSCULAR | Status: AC
  Filled 2014-09-18: qty 2

## 2014-09-18 MED ORDER — CHLORHEXIDINE GLUCONATE 4 % EX LIQD
60.0000 mL | Freq: Once | CUTANEOUS | Status: DC
  Administered 2014-09-18: 4 via TOPICAL

## 2014-09-18 MED ORDER — HYDROMORPHONE HCL 2 MG/ML IJ SOLN
INTRAMUSCULAR | Status: AC
  Filled 2014-09-18: qty 1

## 2014-09-18 MED ORDER — LIDOCAINE HCL (PF) 2 % IJ SOLN
INTRAMUSCULAR | Status: DC | PRN
  Administered 2014-09-18: 20 mg via INTRADERMAL

## 2014-09-18 MED ORDER — RIVAROXABAN 10 MG PO TABS
10.0000 mg | ORAL_TABLET | Freq: Every day | ORAL | Status: DC
  Administered 2014-09-19 – 2014-09-21 (×3): 10 mg via ORAL
  Filled 2014-09-18 (×4): qty 1

## 2014-09-18 MED ORDER — METHOCARBAMOL 500 MG PO TABS
500.0000 mg | ORAL_TABLET | Freq: Four times a day (QID) | ORAL | Status: DC | PRN
  Administered 2014-09-19 – 2014-09-20 (×3): 500 mg via ORAL
  Filled 2014-09-18 (×5): qty 1

## 2014-09-18 MED ORDER — MIDAZOLAM HCL 5 MG/5ML IJ SOLN
INTRAMUSCULAR | Status: DC | PRN
  Administered 2014-09-18: 2 mg via INTRAVENOUS

## 2014-09-18 MED ORDER — HYDROMORPHONE HCL 1 MG/ML IJ SOLN: 0.2500 mg | INTRAMUSCULAR | Status: DC | PRN

## 2014-09-18 MED ORDER — METHOCARBAMOL 1000 MG/10ML IJ SOLN
500.0000 mg | Freq: Four times a day (QID) | INTRAVENOUS | Status: DC | PRN
  Administered 2014-09-18: 500 mg via INTRAVENOUS
  Filled 2014-09-18 (×2): qty 5

## 2014-09-18 MED ORDER — FENTANYL CITRATE (PF) 100 MCG/2ML IJ SOLN
INTRAMUSCULAR | Status: AC
  Filled 2014-09-18: qty 2

## 2014-09-18 MED ORDER — CEFAZOLIN SODIUM-DEXTROSE 2-3 GM-% IV SOLR
2.0000 g | Freq: Four times a day (QID) | INTRAVENOUS | Status: AC
  Administered 2014-09-18 – 2014-09-19 (×2): 2 g via INTRAVENOUS
  Filled 2014-09-18 (×2): qty 50

## 2014-09-18 MED ORDER — DIPHENHYDRAMINE HCL 12.5 MG/5ML PO ELIX: 12.5000 mg | ORAL_SOLUTION | ORAL | Status: DC | PRN

## 2014-09-18 MED ORDER — DEXAMETHASONE SODIUM PHOSPHATE 10 MG/ML IJ SOLN
10.0000 mg | Freq: Once | INTRAMUSCULAR | Status: AC
  Administered 2014-09-18: 10 mg via INTRAVENOUS

## 2014-09-18 MED ORDER — METOCLOPRAMIDE HCL 10 MG PO TABS
5.0000 mg | ORAL_TABLET | Freq: Three times a day (TID) | ORAL | Status: DC | PRN
Start: 2014-09-18 — End: 2014-09-21

## 2014-09-18 MED ORDER — HYDROMORPHONE HCL 1 MG/ML IJ SOLN
INTRAMUSCULAR | Status: DC | PRN
  Administered 2014-09-18 (×2): 1 mg via INTRAVENOUS

## 2014-09-18 MED ORDER — CEFAZOLIN SODIUM-DEXTROSE 2-3 GM-% IV SOLR
2.0000 g | INTRAVENOUS | Status: AC
  Administered 2014-09-18: 2 g via INTRAVENOUS

## 2014-09-18 MED ORDER — SODIUM CHLORIDE 0.9 % IV SOLN
INTRAVENOUS | Status: DC
  Administered 2014-09-18: 20:00:00 via INTRAVENOUS

## 2014-09-18 MED ORDER — VILAZODONE HCL 20 MG PO TABS
20.0000 mg | ORAL_TABLET | Freq: Every morning | ORAL | Status: DC
  Administered 2014-09-19 – 2014-09-21 (×3): 20 mg via ORAL
  Filled 2014-09-18 (×4): qty 1

## 2014-09-18 MED ORDER — ACETAMINOPHEN 10 MG/ML IV SOLN
1000.0000 mg | Freq: Once | INTRAVENOUS | Status: DC
  Administered 2014-09-18: 1000 mg via INTRAVENOUS
  Filled 2014-09-18: qty 100

## 2014-09-18 MED ORDER — ONDANSETRON HCL 4 MG/2ML IJ SOLN: 4.0000 mg | Freq: Once | INTRAMUSCULAR | Status: DC | PRN

## 2014-09-18 MED ORDER — ACETAMINOPHEN 650 MG RE SUPP: 650.0000 mg | Freq: Four times a day (QID) | RECTAL | Status: DC | PRN

## 2014-09-18 MED ORDER — ONDANSETRON HCL 4 MG/2ML IJ SOLN: 4.0000 mg | Freq: Four times a day (QID) | INTRAMUSCULAR | Status: DC | PRN

## 2014-09-18 MED ORDER — ACETAMINOPHEN 10 MG/ML IV SOLN
INTRAVENOUS | Status: DC | PRN
  Administered 2014-09-18: 1000 mg via INTRAVENOUS

## 2014-09-18 MED ORDER — MORPHINE SULFATE 2 MG/ML IJ SOLN
INTRAMUSCULAR | Status: AC
  Administered 2014-09-18: 2 mg via INTRAVENOUS
  Filled 2014-09-18: qty 1

## 2014-09-18 MED ORDER — BUPIVACAINE HCL (PF) 0.25 % IJ SOLN
INTRAMUSCULAR | Status: DC | PRN
  Administered 2014-09-18: 30 mL

## 2014-09-18 MED ORDER — TRANEXAMIC ACID 1000 MG/10ML IV SOLN
1000.0000 mg | INTRAVENOUS | Status: AC
  Administered 2014-09-18: 1000 mg via INTRAVENOUS
  Filled 2014-09-18: qty 10

## 2014-09-18 MED ORDER — SUCCINYLCHOLINE CHLORIDE 20 MG/ML IJ SOLN
INTRAMUSCULAR | Status: DC | PRN
  Administered 2014-09-18: 100 mg via INTRAVENOUS

## 2014-09-18 MED ORDER — MORPHINE SULFATE 2 MG/ML IJ SOLN
1.0000 mg | INTRAMUSCULAR | Status: DC | PRN
  Administered 2014-09-18: 2 mg via INTRAVENOUS
  Filled 2014-09-18: qty 1

## 2014-09-18 MED ORDER — DEXAMETHASONE SODIUM PHOSPHATE 10 MG/ML IJ SOLN
10.0000 mg | Freq: Once | INTRAMUSCULAR | Status: DC
  Filled 2014-09-18: qty 1

## 2014-09-18 MED ORDER — PHENYLEPHRINE HCL 10 MG/ML IJ SOLN
INTRAMUSCULAR | Status: DC | PRN
  Administered 2014-09-18: 80 ug via INTRAVENOUS

## 2014-09-18 SURGICAL SUPPLY — 40 items
BAG DECANTER FOR FLEXI CONT (MISCELLANEOUS) ×2 IMPLANT
BAG ZIPLOCK 12X15 (MISCELLANEOUS) IMPLANT
BLADE EXTENDED COATED 6.5IN (ELECTRODE) ×2 IMPLANT
BLADE SAG 18X100X1.27 (BLADE) ×2 IMPLANT
CAPT HIP TOTAL 2 ×2 IMPLANT
COVER PERINEAL POST (MISCELLANEOUS) ×2 IMPLANT
DECANTER SPIKE VIAL GLASS SM (MISCELLANEOUS) ×2 IMPLANT
DRAPE C-ARM 42X120 X-RAY (DRAPES) ×2 IMPLANT
DRAPE STERI IOBAN 125X83 (DRAPES) ×2 IMPLANT
DRAPE U-SHAPE 47X51 STRL (DRAPES) ×6 IMPLANT
DRSG ADAPTIC 3X8 NADH LF (GAUZE/BANDAGES/DRESSINGS) ×2 IMPLANT
DRSG MEPILEX BORDER 4X4 (GAUZE/BANDAGES/DRESSINGS) ×2 IMPLANT
DRSG MEPILEX BORDER 4X8 (GAUZE/BANDAGES/DRESSINGS) ×2 IMPLANT
DURAPREP 26ML APPLICATOR (WOUND CARE) ×2 IMPLANT
ELECT REM PT RETURN 9FT ADLT (ELECTROSURGICAL) ×2
ELECTRODE REM PT RTRN 9FT ADLT (ELECTROSURGICAL) ×1 IMPLANT
EVACUATOR 1/8 PVC DRAIN (DRAIN) ×2 IMPLANT
FACESHIELD WRAPAROUND (MASK) ×8 IMPLANT
GLOVE BIO SURGEON STRL SZ7.5 (GLOVE) ×2 IMPLANT
GLOVE BIO SURGEON STRL SZ8 (GLOVE) ×4 IMPLANT
GLOVE BIOGEL PI IND STRL 8 (GLOVE) ×2 IMPLANT
GLOVE BIOGEL PI INDICATOR 8 (GLOVE) ×2
GOWN STRL REUS W/TWL LRG LVL3 (GOWN DISPOSABLE) ×2 IMPLANT
GOWN STRL REUS W/TWL XL LVL3 (GOWN DISPOSABLE) ×2 IMPLANT
KIT BASIN OR (CUSTOM PROCEDURE TRAY) ×2 IMPLANT
NDL SAFETY ECLIPSE 18X1.5 (NEEDLE) ×1 IMPLANT
NEEDLE HYPO 18GX1.5 SHARP (NEEDLE) ×1
PACK TOTAL JOINT (CUSTOM PROCEDURE TRAY) ×2 IMPLANT
PEN SKIN MARKING BROAD (MISCELLANEOUS) ×2 IMPLANT
STRIP CLOSURE SKIN 1/2X4 (GAUZE/BANDAGES/DRESSINGS) ×2 IMPLANT
SUT ETHIBOND NAB CT1 #1 30IN (SUTURE) ×2 IMPLANT
SUT MNCRL AB 4-0 PS2 18 (SUTURE) ×2 IMPLANT
SUT VIC AB 2-0 CT1 27 (SUTURE) ×2
SUT VIC AB 2-0 CT1 TAPERPNT 27 (SUTURE) ×2 IMPLANT
SUT VLOC 180 0 24IN GS25 (SUTURE) ×2 IMPLANT
SYR 30ML LL (SYRINGE) ×2 IMPLANT
SYR 50ML LL SCALE MARK (SYRINGE) IMPLANT
TOWEL OR 17X26 10 PK STRL BLUE (TOWEL DISPOSABLE) ×2 IMPLANT
TOWEL OR NON WOVEN STRL DISP B (DISPOSABLE) IMPLANT
TRAY FOLEY CATH 16FRSI W/METER (SET/KITS/TRAYS/PACK) ×2 IMPLANT

## 2014-09-18 NOTE — Anesthesia Procedure Notes (Signed)
Procedure Name: Intubation Date/Time: 09/18/2014 2:49 PM Performed by: Lajuana Carry E Pre-anesthesia Checklist: Patient identified, Emergency Drugs available, Suction available and Patient being monitored Patient Re-evaluated:Patient Re-evaluated prior to inductionOxygen Delivery Method: Circle System Utilized Preoxygenation: Pre-oxygenation with 100% oxygen Intubation Type: IV induction Ventilation: Mask ventilation without difficulty Laryngoscope Size: Mac and 4 Grade View: Grade III Tube type: Oral Tube size: 7.5 mm Number of attempts: 3 Airway Equipment and Method: Stylet and Oral airway Placement Confirmation: ETT inserted through vocal cords under direct vision,  positive ETCO2 and breath sounds checked- equal and bilateral Tube secured with: Tape Dental Injury: Teeth and Oropharynx as per pre-operative assessment and Injury to lip  Difficulty Due To: Difficult Airway- due to anterior larynx Comments: Anterior airway, Miller 3 with limited view, Ett slipped out of cords with stylet removal. Look x2 by Dr. Conrad [place] with Mac 4, limited view, Ett passed, knick to upper lip noted with second attempt. Easy mask airway with good O2 sats throughout.

## 2014-09-18 NOTE — Anesthesia Postprocedure Evaluation (Signed)
Anesthesia Post Note  Patient: Tanner [name]  Procedure(s) Performed: Procedure(s) (LRB): LEFT TOTAL HIP ARTHROPLASTY ANTERIOR APPROACH (Left)  Anesthesia type: general  Patient location: PACU  Post pain: Pain level controlled  Post assessment: Patient's Cardiovascular Status Stable  Last Vitals:  Filed Vitals:   09/18/14 1725  BP: 125/62  Pulse: 62  Temp: 36.6 C  Resp: 16    Post vital signs: Reviewed and stable  Level of consciousness: sedated  Complications: No apparent anesthesia complications

## 2014-09-18 NOTE — Anesthesia Preprocedure Evaluation (Addendum)
Anesthesia Evaluation  Patient identified by MRN, date of birth, ID band Patient awake    Reviewed: Allergy & Precautions, NPO status , Patient's Chart, lab work & pertinent test results  Airway Mallampati: II  TM Distance: >3 FB Neck ROM: Full    Dental   Pulmonary sleep apnea ,    Pulmonary exam normal       Cardiovascular     Neuro/Psych    GI/Hepatic   Endo/Other    Renal/GU      Musculoskeletal   Abdominal   Peds  Hematology   Anesthesia Other Findings   Reproductive/Obstetrics                            Anesthesia Physical Anesthesia Plan  ASA: III  Anesthesia Plan: General   Post-op Pain Management:    Induction: Intravenous  Airway Management Planned: LMA  Additional Equipment:   Intra-op Plan:   Post-operative Plan: Extubation in OR  Informed Consent: I have reviewed the patients History and Physical, chart, labs and discussed the procedure including the risks, benefits and alternatives for the proposed anesthesia with the patient or authorized representative who has indicated his/her understanding and acceptance.     Plan Discussed with: CRNA and Surgeon  Anesthesia Plan Comments:         Anesthesia Quick Evaluation

## 2014-09-18 NOTE — Transfer of Care (Signed)
Immediate Anesthesia Transfer of Care Note  Patient: Tanner [name]  Procedure(s) Performed: Procedure(s): LEFT TOTAL HIP ARTHROPLASTY ANTERIOR APPROACH (Left)  Patient Location: PACU  Anesthesia Type:General  Level of Consciousness:  sedated, patient cooperative and responds to stimulation  Airway & Oxygen Therapy:Patient Spontanous Breathing and Patient connected to face mask oxgen  Post-op Assessment:  Report given to PACU RN and Post -op Vital signs reviewed and stable  Post vital signs:  Reviewed and stable  Last Vitals:  Filed Vitals:   09/18/14 1646  BP: 140/69  Pulse: 74  Temp:   Resp: 9    Complications: No apparent anesthesia complications

## 2014-09-18 NOTE — Interval H&P Note (Signed)
History and Physical Interval Note:  09/18/2014 2:30 PM  Tanner [name]  has presented today for surgery, with the diagnosis of OA LEFT HIP   The various methods of treatment have been discussed with the patient and family. After consideration of risks, benefits and other options for treatment, the patient has consented to  Procedure(s): LEFT TOTAL HIP ARTHROPLASTY ANTERIOR APPROACH (Left) as a surgical intervention .  The patient's history has been reviewed, patient examined, no change in status, stable for surgery.  I have reviewed the patient's chart and labs.  Questions were answered to the patient's satisfaction.     Gearlean Alf

## 2014-09-18 NOTE — H&P (View-Only) (Signed)
Tanner [name] Dam DOB: 05/27/1946 Widowed / Language: Cleophus [name] / Race: White Male Date of Admission:  09/18/2014 CC:  Left Hip Pain History of Present Illness The patient is a 69 year old male who comes in today for a preoperative History and Physical. The patient is scheduled for a left total hip arthroplasty (anterior approach) to be performed by Dr. Dione Plover. [name], MD at [facility] on 09-18-2014. The patient comes in postop from right total hip arthroplasty (anterior approach). The patient states that he has some pain and discomfort at that time. He is still having difficulty with his left hip as well which continues to get worse with time. The problem he has with the right hip is lateral pain on occasion. He stated with activity it tends to get worse. The left hip is becoming more problematic for him. He is now at a stage where he feels like needs to get it operated on because it is hurting him more. Risks and benefits of the surgery have been discussed with the patient and they elect to proceed with surgery.  There are on active contraindications to upcoming procedure such as ongoing infection or progressive neurological disease.  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 father Osteoarthritis mother and  grandmother mothers side Depression mother Cancer grandmother fathers side Drug / Alcohol Addiction father, grandfather mothers side and grandfather fathers side Kidney disease father Heart Disease father, grandfather mothers side and grandfather fathers side Hypertension First Degree Relatives. mother  Social History  Marital status widowed Tobacco use Never smoker. never smoker Drug/Alcohol Rehab (Currently) no Living situation live alone Tobacco / smoke exposure yes outdoors only Alcohol use current drinker; drinks beer, wine and hard liquor; 5-7 per week Illicit drug use no Pain Contract no Current work status retired Children 2 Post-Surgical Plans Copy of Drug/Alcohol Rehab (Previously) no  Medication History  Ibuprofen (200MG  Capsule, 1 (one) Oral) Active. Viibryd (20MG  Tablet, Oral) Active. Aspirin (81MG  Tablet, 1 (one) Oral) Active. Vitamin D (400UNIT Capsule, 1 (one) Oral) Active. (1000IU) Minocycline HCl (100MG  Capsule, Oral) Active. Omeprazole (40MG  Capsule DR, Oral) Active. Adderall (15MG  Tablet, Oral) Active. (30mg ) Simvastatin (10MG  Tablet, Oral) Active. Finasteride (Oral) Specific dose unknown - Active.  Past Surgical History  Tonsillectomy Rotator Cuff Repair right Foot Surgery bilateral Cataract Surgery right Arthroscopy of Shoulder right Inguinal Hernia Repair open: left Arthroscopy of Knee right Total Hip Replacement - Right Date: 05/2013. Anterior   Review of Systems General Not Present- Chills, Fatigue, Fever, Memory Loss, Night Sweats, Weight Gain and Weight Loss. Skin Not Present- Eczema, Hives, Itching, Lesions and Rash. HEENT Not Present- Dentures, Double Vision, Headache, Hearing Loss, Tinnitus and Visual Loss. Respiratory Not Present- Allergies, Chronic Cough, Coughing up blood, Shortness of breath at rest and Shortness of breath with exertion. Cardiovascular Not Present- Chest Pain, Difficulty Breathing  Lying Down, Murmur, Palpitations, Racing/skipping heartbeats and Swelling. Gastrointestinal Not Present- Abdominal Pain, Bloody Stool, Constipation, Diarrhea, Difficulty Swallowing, Heartburn, Jaundice, Loss of appetitie, Nausea and Vomiting. Male Genitourinary Not  Present- Blood in Urine, Discharge, Flank Pain, Incontinence, Painful Urination, Urgency, Urinary frequency, Urinary Retention, Urinating at Night and Weak urinary stream. Musculoskeletal Not Present- Back Pain, Joint Pain, Joint Swelling, Morning Stiffness, Muscle Pain, Muscle Weakness and Spasms. Neurological Not Present- Blackout spells, Difficulty with balance, Dizziness, Paralysis, Tremor and Weakness. Psychiatric Not Present- Insomnia.  Vitals  Weight: 174 lb Height: 70.5in Body Surface Area: 1.98 m Body Mass Index: 24.61 kg/m  BP: 122/78 (Sitting, Right Arm, Standard)  Physical Exam  General Mental Status -Alert, cooperative and good historian. General Appearance-pleasant, Not in acute distress. Orientation-Oriented X3. Build & Nutrition-Well nourished and Well developed.  Head and Neck Head-normocephalic, atraumatic . Neck Global Assessment - supple, no bruit auscultated on the right, no bruit auscultated on the left.  Eye Vision-Wears corrective lenses. Pupil - Bilateral-Regular and Round. Motion - Bilateral-EOMI.  Chest and Lung Exam Auscultation Breath sounds - clear at anterior chest wall and clear at posterior chest wall. Adventitious sounds - No Adventitious sounds.  Cardiovascular Auscultation Rhythm - Regular rate and rhythm. Heart Sounds - S1 WNL and S2 WNL. Murmurs & Other Heart Sounds - Auscultation of the heart reveals - No Murmurs.  Abdomen Palpation/Percussion Tenderness - Abdomen is non-tender to palpation. Rigidity (guarding) - Abdomen is soft. Auscultation Auscultation of the abdomen reveals - Bowel sounds normal.  Male Genitourinary Note: Not done, not  pertinent to present illness   Musculoskeletal Note: Well developed male, alert and oriented in no apparent distress. He is in excellent physical condition. He appears younger than his stated age.  His left hip can be flexed to 110, rotated in 30, out 40, abducted 40.  Assessment & Plan Osteoarthritis of left hip (M16.12) Note:Surgical Plans: Left Total Hip Repalcement - Anterior Approach  Disposition: Home  PCP: Dr. Darrick Penna  IV TXA  Anesthesia Issues: None  Arlee Muslim, PA-C

## 2014-09-18 NOTE — Progress Notes (Signed)
Utilization review completed.  

## 2014-09-18 NOTE — Op Note (Signed)
OPERATIVE REPORT  PREOPERATIVE DIAGNOSIS: Avascular necrosis of the Left hip.   POSTOPERATIVE DIAGNOSIS: Avascular necrosis of the Left  hip.   PROCEDURE: Left total hip arthroplasty, anterior approach.   SURGEON: Gaynelle Arabian, MD   ASSISTANT: Arlee Muslim, PA-C  ANESTHESIA:  General  ESTIMATED BLOOD LOSS:-450 ml    DRAINS: Hemovac x1.   COMPLICATIONS: None   CONDITION: PACU - hemodynamically stable.   BRIEF CLINICAL NOTE: Tanner [name] is a 69 y.o. male who has osteonecrosis  of his Left  hip with progressively worsening pain and  dysfunction.The patient has failed nonoperative management and presents for  total hip arthroplasty.   PROCEDURE IN DETAIL: After successful administration of spinal  anesthetic, the traction boots for the [facility] bed were placed on both  feet and the patient was placed onto the [facility] bed, boots placed into the leg  holders. The Left hip was then isolated from the perineum with plastic  drapes and prepped and draped in the usual sterile fashion. ASIS and  greater trochanter were marked and a oblique incision was made, starting  at about 1 cm lateral and 2 cm distal to the ASIS and coursing towards  the anterior cortex of the femur. The skin was cut with a 10 blade  through subcutaneous tissue to the level of the fascia overlying the  tensor fascia lata muscle. The fascia was then incised in line with the  incision at the junction of the anterior third and posterior 2/3rd. The  muscle was teased off the fascia and then the interval between the TFL  and the rectus was developed. The Hohmann retractor was then placed at  the top of the femoral neck over the capsule. The vessels overlying the  capsule were cauterized and the fat on top of the capsule was removed.  A Hohmann retractor was then placed anterior underneath the rectus  femoris to give exposure to the entire anterior capsule. A T-shaped  capsulotomy was performed. The edges were  tagged and the femoral head  was identified.       Osteophytes are removed off the superior acetabulum.  The femoral neck was then cut in situ with an oscillating saw. Traction  was then applied to the left lower extremity utilizing the [facility]  traction. The femoral head was then removed. Retractors were placed  around the acetabulum and then circumferential removal of the labrum was  performed. Osteophytes were also removed. Reaming starts at 47 mm to  medialize and  Increased in 2 mm increments to 51 mm. We reamed in  approximately 40 degrees of abduction, 20 degrees anteversion. A 52 mm  pinnacle acetabular shell was then impacted in anatomic position under  fluoroscopic guidance with excellent purchase. We did not need to place  any additional dome screws. A 32 mm neutral + 4 marathon liner was then  placed into the acetabular shell.       The femoral lift was then placed along the lateral aspect of the femur  just distal to the vastus ridge. The leg was  externally rotated and capsule  was stripped off the inferior aspect of the femoral neck down to the  level of the lesser trochanter, this was done with electrocautery. The femur was lifted after this was performed. The  leg was then placed and extended in adducted position to essentially delivering the femur. We also removed the capsule superiorly and the  piriformis from the piriformis fossa  to gain excellent exposure of the  proximal femur. Rongeur was used to remove some cancellous bone to get  into the lateral portion of the proximal femur for placement of the  initial starter reamer. The starter broaches was placed  the starter broach  and was shown to go down the center of the canal. Broaching  with the  Corail system was then performed starting at size 8, coursing  Up to size 12. A size 12 had excellent torsional and rotational  and axial stability. The trial standard offset neck was then placed  with a 32 + 1 trial head. The hip  was then reduced. We confirmed that  the stem was in the canal both on AP and lateral x-rays. It also has excellent sizing. The hip was reduced with outstanding stability through full extension, full external rotation,  and then flexion in adduction internal rotation. AP pelvis was taken  and the leg lengths were measured and found to be exactly equal. Hip  was then dislocated again and the femoral head and neck removed. The  femoral broach was removed. Size 12 Corail stem with a standard offset  neck was then impacted into the femur following native anteversion. Has  excellent purchase in the canal. Excellent torsional and rotational and  axial stability. It is confirmed to be in the canal on AP and lateral  fluoroscopic views. The 32 + 1 ceramic head was placed and the hip  reduced with outstanding stability. Again AP pelvis was taken and it  confirmed that the leg lengths were equal. The wound was then copiously  irrigated with saline solution and the capsule reattached and repaired  with Ethibond suture.  20 mL of Exparel mixed with 50 mL of saline then additional 20 ml of .25% Bupivicaine injected into the capsule and into the edge of the tensor fascia lata as well as subcutaneous tissue. The fascia overlying the tensor fascia lata was  then closed with a running #1 V-Loc. Subcu was closed with interrupted  2-0 Vicryl and subcuticular running 4-0 Monocryl. Incision was cleaned  and dried. Steri-Strips and a bulky sterile dressing applied. Hemovac  drain was hooked to suction and then he was awakened and transported to  recovery in stable condition.        Please note that a surgical assistant was a medical necessity for this procedure to perform it in a safe and expeditious manner. Assistant was necessary to provide appropriate retraction of vital neurovascular structures and to prevent femoral fracture and allow for anatomic placement of the prosthesis.  Gaynelle Arabian, M.D.

## 2014-09-19 ENCOUNTER — Encounter (HOSPITAL_COMMUNITY): Payer: Self-pay | Admitting: Orthopedic Surgery

## 2014-09-19 LAB — CBC
HEMATOCRIT: 41.5 % (ref 39.0–52.0)
HEMOGLOBIN: 13.6 g/dL (ref 13.0–17.0)
MCH: 32.1 pg (ref 26.0–34.0)
MCHC: 32.8 g/dL (ref 30.0–36.0)
MCV: 97.9 fL (ref 78.0–100.0)
Platelets: 208 10*3/uL (ref 150–400)
RBC: 4.24 MIL/uL (ref 4.22–5.81)
RDW: 13.1 % (ref 11.5–15.5)
WBC: 9.7 10*3/uL (ref 4.0–10.5)

## 2014-09-19 LAB — BASIC METABOLIC PANEL
Anion gap: 5 (ref 5–15)
BUN: 14 mg/dL (ref 6–23)
CALCIUM: 8.6 mg/dL (ref 8.4–10.5)
CHLORIDE: 104 mmol/L (ref 96–112)
CO2: 29 mmol/L (ref 19–32)
CREATININE: 0.93 mg/dL (ref 0.50–1.35)
GFR, EST NON AFRICAN AMERICAN: 83 mL/min — AB (ref >90)
Glucose, Bld: 170 mg/dL — ABNORMAL HIGH (ref 70–99)
Potassium: 4.5 mmol/L (ref 3.5–5.1)
Sodium: 138 mmol/L (ref 135–145)

## 2014-09-19 MED ORDER — OXYCODONE HCL 5 MG PO TABS: 5.0000 mg | ORAL_TABLET | ORAL | Status: DC | PRN

## 2014-09-19 MED ORDER — RIVAROXABAN 10 MG PO TABS: 10.0000 mg | ORAL_TABLET | Freq: Every day | ORAL | Status: DC

## 2014-09-19 MED ORDER — METHOCARBAMOL 500 MG PO TABS: 500.0000 mg | ORAL_TABLET | Freq: Four times a day (QID) | ORAL | Status: DC | PRN

## 2014-09-19 NOTE — Discharge Instructions (Signed)
Dr. Gaynelle Arabian Total Joint Specialist [facility] [address] [place], [place] [address] [phone]  ANTERIOR APPROACH TOTAL HIP REPLACEMENT POSTOPERATIVE DIRECTIONS   Hip Rehabilitation, Guidelines Following Surgery  The results of a hip operation are greatly improved after range of motion and muscle strengthening exercises. Follow all safety measures which are given to protect your hip. If any of these exercises cause increased pain or swelling in your joint, decrease the amount until you are comfortable again. Then slowly increase the exercises. Call your caregiver if you have problems or questions.   HOME CARE INSTRUCTIONS  Remove items at home which could result in a fall. This includes throw rugs or furniture in walking pathways.   ICE to the affected hip every three hours for 30 minutes at a time and then as needed for pain and swelling.  Continue to use ice on the hip for pain and swelling from surgery. You may notice swelling that will progress down to the foot and ankle.  This is normal after surgery.  Elevate the leg when you are not up walking on it.    Continue to use the breathing machine which will help keep your temperature down.  It is common for your temperature to cycle up and down following surgery, especially at night when you are not up moving around and exerting yourself.  The breathing machine keeps your lungs expanded and your temperature down.  Do not place pillow under knee, focus on keeping the knee straight while resting  DIET You may resume your previous home diet once your are discharged from the hospital.  DRESSING / WOUND CARE / SHOWERING You may change your dressing 3-5 days after surgery.  Then change the dressing every day with sterile gauze.  Please use good hand washing techniques before changing the dressing.  Do not use any lotions or creams on the incision until instructed by your surgeon. You may start showering  once you are discharged home but do not submerge the incision under water. Just pat the incision dry and apply a dry gauze dressing on daily. Change the surgical dressing daily and reapply a dry dressing each time.  ACTIVITY Walk with your walker as instructed. Use walker as long as suggested by your caregivers. Avoid periods of inactivity such as sitting longer than an hour when not asleep. This helps prevent blood clots.  You may resume a sexual relationship in one month or when given the OK by your doctor.  You may return to work once you are cleared by your doctor.  Do not drive a car for 6 weeks or until released by you surgeon.  Do not drive while taking narcotics.  WEIGHT BEARING Weight bearing as tolerated with assist device (walker, cane, etc) as directed, use it as long as suggested by your surgeon or therapist, typically at least 4-6 weeks.  POSTOPERATIVE CONSTIPATION PROTOCOL Constipation - defined medically as fewer than three stools per week and severe constipation as less than one stool per week.  One of the most common issues patients have following surgery is constipation.  Even if you have a regular bowel pattern at home, your normal regimen is likely to be disrupted due to multiple reasons following surgery.  Combination of anesthesia, postoperative narcotics, change in appetite and fluid intake all can affect your bowels.  In order to avoid complications following surgery, here are some recommendations in order to help you during your recovery period.  Colace (docusate) - Pick  up an over-the-counter form of Colace or another stool softener and take twice a day as long as you are requiring postoperative pain medications.  Take with a full glass of water daily.  If you experience loose stools or diarrhea, hold the colace until you stool forms back up.  If your symptoms do not get better within 1 week or if they get worse, check with your doctor. ° °Dulcolax (bisacodyl) - Pick up  over-the-counter and take as directed by the product packaging as needed to assist with the movement of your bowels.  Take with a full glass of water.  Use this product as needed if not relieved by Colace only.  ° °MiraLax (polyethylene glycol) - Pick up over-the-counter to have on hand.  MiraLax is a solution that will increase the amount of water in your bowels to assist with bowel movements.  Take as directed and can mix with a glass of water, juice, soda, coffee, or tea.  Take if you go more than two days without a movement. °Do not use MiraLax more than once per day. Call your doctor if you are still constipated or irregular after using this medication for 7 days in a row. ° °If you continue to have problems with postoperative constipation, please contact the office for further assistance and recommendations.  If you experience "the worst abdominal pain ever" or develop nausea or vomiting, please contact the office immediatly for further recommendations for treatment. ° °ITCHING ° If you experience itching with your medications, try taking only a single pain pill, or even half a pain pill at a time.  You can also use Benadryl over the counter for itching or also to help with sleep.  ° °TED HOSE STOCKINGS °Wear the elastic stockings on both legs for three weeks following surgery during the day but you may remove then at night for sleeping. ° °MEDICATIONS °See your medication summary on the “After Visit Summary” that the nursing staff will review with you prior to discharge.  You may have some home medications which will be placed on hold until you complete the course of blood thinner medication.  It is important for you to complete the blood thinner medication as prescribed by your surgeon.  Continue your approved medications as instructed at time of discharge. ° °PRECAUTIONS °If you experience chest pain or shortness of breath - call 911 immediately for transfer to the hospital emergency department.  °If you  develop a fever greater that 101 F, purulent drainage from wound, increased redness or drainage from wound, foul odor from the wound/dressing, or calf pain - CONTACT YOUR SURGEON.   °                                                °FOLLOW-UP APPOINTMENTS °Make sure you keep all of your appointments after your operation with your surgeon and caregivers. You should call the office at the above phone number and make an appointment for approximately two weeks after the date of your surgery or on the date instructed by your surgeon outlined in the "After Visit Summary". ° °RANGE OF MOTION AND STRENGTHENING EXERCISES  °These exercises are designed to help you keep full movement of your hip joint. Follow your caregiver's or physical therapist's instructions. Perform all exercises about fifteen times, three times per day or as directed. Exercise both hips,   even if you have had only one joint replacement. These exercises can be done on a training (exercise) mat, on the floor, on a table or on a bed. Use whatever works the best and is most comfortable for you. Use music or television while you are exercising so that the exercises are a pleasant break in your day. This will make your life better with the exercises acting as a break in routine you can look forward to.  Lying on your back, slowly slide your foot toward your buttocks, raising your knee up off the floor. Then slowly slide your foot back down until your leg is straight again.  Lying on your back spread your legs as far apart as you can without causing discomfort.  Lying on your side, raise your upper leg and foot straight up from the floor as far as is comfortable. Slowly lower the leg and repeat.  Lying on your back, tighten up the muscle in the front of your thigh (quadriceps muscles). You can do this by keeping your leg straight and trying to raise your heel off the floor. This helps strengthen the largest muscle supporting your knee.  Lying on your back,  tighten up the muscles of your buttocks both with the legs straight and with the knee bent at a comfortable angle while keeping your heel on the floor.   IF YOU ARE TRANSFERRED TO A SKILLED REHAB FACILITY If the patient is transferred to a skilled rehab facility following release from the hospital, a list of the current medications will be sent to the facility for the patient to continue.  When discharged from the skilled rehab facility, please have the facility set up the patient's Harvey prior to being released. Also, the skilled facility will be responsible for providing the patient with their medications at time of release from the facility to include their pain medication, the muscle relaxants, and their blood thinner medication. If the patient is still at the rehab facility at time of the two week follow up appointment, the skilled rehab facility will also need to assist the patient in arranging follow up appointment in our office and any transportation needs.  MAKE SURE YOU:  Understand these instructions.  Get help right away if you are not doing well or get worse.    Pick up stool softner and laxative for home use following surgery while on pain medications. Do not submerge incision under water. Please use good hand washing techniques while changing dressing each day. May shower starting three days after surgery. Please use a clean towel to pat the incision dry following showers. Continue to use ice for pain and swelling after surgery. Do not use any lotions or creams on the incision until instructed by your surgeon.  Take Xarelto for two and a half more weeks, then discontinue Xarelto. Once the patient has completed the Xarelto, they may resume the 81 mg Aspirin.  _______________  Information on my medicine - XARELTO (Rivaroxaban)  This medication education was reviewed with me or my healthcare representative as part of my discharge preparation.   Why was  Xarelto prescribed for you? Xarelto was prescribed for you to reduce the risk of blood clots forming after orthopedic surgery. The medical term for these abnormal blood clots is venous thromboembolism (VTE).  What do you need to know about xarelto ? Take your Xarelto ONCE DAILY at the same time every day. You may take it either with or without food.  If  you have difficulty swallowing the tablet whole, you may crush it and mix in applesauce just prior to taking your dose.  Take Xarelto exactly as prescribed by your doctor and DO NOT stop taking Xarelto without talking to the doctor who prescribed the medication.  Stopping without other VTE prevention medication to take the place of Xarelto may increase your risk of developing a clot.  After discharge, you should have regular check-up appointments with your healthcare provider that is prescribing your Xarelto.    What do you do if you miss a dose? If you miss a dose, take it as soon as you remember on the same day then continue your regularly scheduled once daily regimen the next day. Do not take two doses of Xarelto on the same day.   Important Safety Information A possible side effect of Xarelto is bleeding. You should call your healthcare provider right away if you experience any of the following: ? Bleeding from an injury or your nose that does not stop. ? Unusual colored urine (red or dark brown) or unusual colored stools (red or black). ? Unusual bruising for unknown reasons. ? A serious fall or if you hit your head (even if there is no bleeding).  Some medicines may interact with Xarelto and might increase your risk of bleeding while on Xarelto. To help avoid this, consult your healthcare provider or pharmacist prior to using any new prescription or non-prescription medications, including herbals, vitamins, non-steroidal anti-inflammatory drugs (NSAIDs) and supplements.  This website has more information on Xarelto:  [email or web address]

## 2014-09-19 NOTE — Evaluation (Signed)
Physical Therapy Evaluation Patient Details Name: Tanner [name] MRN: [id number] DOB: 02/18/1946 Today's Date: 09/19/2014   History of Present Illness  [name] 09/18/14, s/p R 12/14  Clinical Impression  Patient tolerated very well, c/o feeling stiffness l thigh. Patient will benefit from PT to address problems listed in note below.    Follow Up Recommendations Home health PT;Supervision/Assistance - 24 hour    Equipment Recommendations  None recommended by PT    Recommendations for Other Services       Precautions / Restrictions Precautions Precautions: Fall      Mobility  Bed Mobility Overal bed mobility: Needs Assistance Bed Mobility: Supine to Sit     Supine to sit: Min guard     General bed mobility comments: cues  for safety and technique  Transfers Overall transfer level: Needs assistance Equipment used: Rolling walker (2 wheeled) Transfers: Sit to/from Stand Sit to Stand: Supervision         General transfer comment: cues for safety  Ambulation/Gait Ambulation/Gait assistance: Supervision Ambulation Distance (Feet): 250 Feet   Gait Pattern/deviations: Step-to pattern;Decreased stance time - left;Decreased step length - left     General Gait Details: cues for sequence  Stairs            Wheelchair Mobility    Modified Rankin (Stroke Patients Only)       Balance                                             Pertinent Vitals/Pain Pain Assessment: 0-10 Pain Score: 4  Pain Location: L thigh Pain Descriptors / Indicators: Aching;Tightness    Home Living Family/patient expects to be discharged to:: Private residence Living Arrangements: Alone Available Help at Discharge: Friend(s) Type of Home: House Home Access: Stairs to enter Entrance Stairs-Rails: None Entrance Stairs-Number of Steps: 3 Home Layout: Multi-level Home Equipment: Environmental consultant - 2 wheels;Shower seat;Cane - single point;Crutches      Prior Function  Level of Independence: Independent               Hand Dominance        Extremity/Trunk Assessment               Lower Extremity Assessment: LLE deficits/detail   LLE Deficits / Details: able to advance LLE, hip flexion to 70 in supine aith AA     Communication   Communication: No difficulties  Cognition Arousal/Alertness: Awake/alert Behavior During Therapy: WFL for tasks assessed/performed;Impulsive Overall Cognitive Status: Within Functional Limits for tasks assessed                      General Comments      Exercises Total Joint Exercises Ankle Circles/Pumps: AROM;Both;10 reps;Supine Quad Sets: AROM;Both;10 reps;Supine Short Arc Quad: AROM;Left;10 reps;Supine Heel Slides: AAROM;Left;10 reps;Supine Hip ABduction/ADduction: AAROM;Left;10 reps;Supine      Assessment/Plan    PT Assessment Patient needs continued PT services  PT Diagnosis Difficulty walking   PT Problem List Decreased strength;Decreased range of motion;Decreased activity tolerance;Decreased mobility;Decreased knowledge of use of DME;Decreased knowledge of precautions;Decreased safety awareness  PT Treatment Interventions DME instruction;Gait training;Stair training;Functional mobility training;Therapeutic activities;Therapeutic exercise;Patient/family education   PT Goals (Current goals can be found in the Care Plan section) Acute Rehab PT Goals Patient Stated Goal: to walk without pain PT Goal Formulation: With patient Time For Goal Achievement: 09/21/14 Potential to Achieve  Goals: Good    Frequency 7X/week   Barriers to discharge        Co-evaluation               End of Session   Activity Tolerance: Patient tolerated treatment well Patient left: in chair;with call bell/phone within reach;with family/visitor present Nurse Communication: Mobility status;Patient requests pain meds         Time: 8184-0375 PT Time Calculation (min) (ACUTE ONLY): 41  min   Charges:   PT Evaluation $Initial PT Evaluation Tier I: 1 Procedure PT Treatments $Gait Training: 8-22 mins $Therapeutic Exercise: 8-22 mins   PT G Codes:        Claretha Cooper 09/19/2014, 10:41 AM  Tresa Endo PT [phone]

## 2014-09-19 NOTE — Evaluation (Signed)
Occupational Therapy Evaluation Patient Details Name: Tanner [name] MRN: [id number] DOB: 1946/02/13 Today's Date: 09/19/2014    History of Present Illness [name] 09/18/14, s/p R 12/14   Clinical Impression   This 69 year old man was admitted for the above surgery.  Evaluation focused on education.  He and friend feel comfortable with ADLs and bathroom transfers and no further OT is needed at this time.    Follow Up Recommendations  Supervision/Assistance - 24 hour;No OT follow up    Equipment Recommendations  None recommended by OT    Recommendations for Other Services       Precautions / Restrictions Precautions Precautions: Fall Restrictions Weight Bearing Restrictions: No      Mobility Bed Mobility Overal bed mobility: Needs Assistance Bed Mobility: Supine to Sit     Supine to sit: Min guard     General bed mobility comments: cues  for safety and technique  Transfers             General transfer comment: did not perform:  supervision with PT.  Reviewed UE/LE placement    Balance                                            ADL Overall ADL's : Needs assistance/impaired                                       General ADL Comments: pt had L THA in 12/14.  Pt's friend present and she will be with him most of the time.  Last sx, he went to her house.  This time he will stay at his own house.  Pt did not recall sequence for stepping into shower and was concerned that shower stall is so small.  Demonstrated technique and explained positioning of bench in small shower stall (may be best to set legs up higher but still be able to place feet on floor and he has the option of opening shower door, propping foot on higher surface and hanging shower curtain to control water).    Pt did not want to practice.  He also did not want to practice transfer to commode:  he had difficulty stating UE/LE placement.  Reviewed this with him as well  as positioning in bed as he had difficulty getting comfortable last time and ended up sleeping in a recliner.       Vision     Perception     Praxis      Pertinent Vitals/Pain Pain Assessment: 0-10 Pain Score: 4  Pain Location: L thigh Pain Descriptors / Indicators: Aching Pain Intervention(s): Monitored during session     Hand Dominance     Extremity/Trunk Assessment Upper Extremity Assessment Upper Extremity Assessment: Overall WFL for tasks assessed          Communication Communication Communication: No difficulties   Cognition Arousal/Alertness: Awake/alert Behavior During Therapy: WFL for tasks assessed/performed;Impulsive Overall Cognitive Status: Within Functional Limits for tasks assessed                     General Comments       Exercises       Shoulder Instructions      Home Living Family/patient expects to be discharged to:: Private residence Living Arrangements: Alone  Available Help at Discharge: Friend(s) (all but 1/2 hour or so a day) Type of Home: House Home Access: Stairs to enter [employer] of Steps: 3 Entrance Stairs-Rails: None Home Layout: Multi-level Alternate Level Stairs-Number of Steps: 3 Alternate Level Stairs-Rails: None Bathroom Shower/Tub: Occupational psychologist: Handicapped height     Home Equipment: Environmental consultant - 2 wheels;Shower seat;Cane - single point;Crutches          Prior Functioning/Environment Level of Independence: Independent             OT Diagnosis: Acute pain   OT Problem List:     OT Treatment/Interventions:      OT Goals(Current goals can be found in the care plan section) Acute Rehab OT Goals Patient Stated Goal: to walk without pain  OT Frequency:     Barriers to D/C:            Co-evaluation              End of Session    Activity Tolerance: Patient tolerated treatment well Patient left: in chair;with call bell/phone within reach;with  family/visitor present   Time: 1049-1105 OT Time Calculation (min): 16 min Charges:  OT General Charges $OT Visit: 1 Procedure OT Evaluation $Initial OT Evaluation Tier I: 1 Procedure G-Codes:    Sunday Klos 10/17/2014, 12:24 PM  Lesle Chris, OTR/L [phone] 17-Oct-2014

## 2014-09-19 NOTE — Progress Notes (Signed)
   Subjective: 1 Day Post-Op Procedure(s) (LRB): LEFT TOTAL HIP ARTHROPLASTY ANTERIOR APPROACH (Left) Patient reports pain as mild.   Patient seen in rounds with Dr. Wynelle Link. Patient is well, and has had no acute complaints or problems Patient is doing fairly well.  If he does very well, then maybe home this PM, otherwise tomorrow.  Objective: Vital signs in last 24 hours: Temp:  [97.3 F (36.3 C)-98.5 F (36.9 C)] 97.7 F (36.5 C) (04/19 0639) Pulse Rate:  [60-76] 60 (04/19 0639) Resp:  [9-18] 18 (04/19 0639) BP: (109-140)/(60-77) 109/65 mmHg (04/19 0639) SpO2:  [96 %-100 %] 96 % (04/19 0639) Weight:  [79.833 kg (176 lb)-80.91 kg (178 lb 6 oz)] 79.833 kg (176 lb) (04/18 1725)  Intake/Output from previous day:  Intake/Output Summary (Last 24 hours) at 09/19/14 0902 Last data filed at 09/19/14 0820  Gross per 24 hour  Intake 3113.33 ml  Output   4565 ml  Net -1451.67 ml    Intake/Output this shift: Total I/O In: 240 [P.O.:240] Out: -   Labs:  Recent Labs  09/19/14 0420  HGB 13.6    Recent Labs  09/19/14 0420  WBC 9.7  RBC 4.24  HCT 41.5  PLT 208    Recent Labs  09/19/14 0420  NA 138  K 4.5  CL 104  CO2 29  BUN 14  CREATININE 0.93  GLUCOSE 170*  CALCIUM 8.6   No results for input(s): LABPT, INR in the last 72 hours.  EXAM: General - Patient is Alert, Appropriate and Oriented Extremity - Neurovascular intact Sensation intact distally Dorsiflexion/Plantar flexion intact Incision - clean, dry, no drainage, healing Motor Function - intact, moving foot and toes well on exam.   Assessment/Plan: 1 Day Post-Op Procedure(s) (LRB): LEFT TOTAL HIP ARTHROPLASTY ANTERIOR APPROACH (Left) Procedure(s) (LRB): LEFT TOTAL HIP ARTHROPLASTY ANTERIOR APPROACH (Left) Past Medical History  Diagnosis Date  . IBS (irritable bowel syndrome)   . Rosacea   . Depression   . ADD (attention deficit disorder)   . Esophageal reflux   . BPH (benign prostatic  hypertrophy)   . Arthritis   . Cancer     basal skin   . OSA (obstructive sleep apnea)     central sleep apnea   Principal Problem:   Avascular necrosis of hip Active Problems:   OA (osteoarthritis) of hip  Estimated body mass index is 25.25 kg/(m^2) as calculated from the following:   Height as of this encounter: 5\' 10"  (1.778 m).   Weight as of this encounter: 79.833 kg (176 lb). Advance diet Up with therapy Discharge home with home health either later today or tomorrow Diet - Cardiac diet Follow up - in 2 weeks Activity - WBAT Disposition - Home Condition Upon Discharge - pending D/C Meds - See DC Summary DVT Prophylaxis - Xarelto  Arlee Muslim, PA-C Orthopaedic Surgery 09/19/2014, 9:02 AM

## 2014-09-19 NOTE — Progress Notes (Signed)
Physical Therapy Treatment Patient Details Name: Tanner [name] MRN: [id number] DOB: 1946-01-08 Today's Date: 09/19/2014    History of Present Illness [name] 09/18/14, s/p R 12/14    PT Comments    Patient  Is progressing well, plans DC  Tomorrow.  Follow Up Recommendations  Home health PT;Supervision/Assistance - 24 hour     Equipment Recommendations  None recommended by PT    Recommendations for Other Services       Precautions / Restrictions Precautions Precautions: Fall    Mobility  Bed Mobility   Bed Mobility: Supine to Sit           General bed mobility comments: cues  for safety and technique, instructed in  use of sheet  to self assist  Left leg.  Transfers   Equipment used: Rolling walker (2 wheeled) Transfers: Sit to/from Stand Sit to Stand: Supervision         General transfer comment: cues for sit to stand  Ambulation/Gait Ambulation/Gait assistance: Supervision Ambulation Distance (Feet): 400 Feet Assistive device: Rolling walker (2 wheeled) Gait Pattern/deviations: Step-through pattern;Antalgic     General Gait Details: cues for sequence   Stairs            Wheelchair Mobility    Modified Rankin (Stroke Patients Only)       Balance                                    Cognition Arousal/Alertness: Awake/alert                          Exercises Total Joint Exercises Ankle Circles/Pumps: AROM;Both;10 reps;Supine Quad Sets: AROM;Both;10 reps;Supine Hip ABduction/ADduction: AAROM;Left;10 reps;Supine    General Comments        Pertinent Vitals/Pain Pain Score: 4  Pain Location: L hip and thigh Pain Descriptors / Indicators: Tightness Pain Intervention(s): Monitored during session;Repositioned;Ice applied    Home Living                      Prior Function            PT Goals (current goals can now be found in the care plan section) Progress towards PT goals: Progressing toward  goals    Frequency  7X/week    PT Plan Current plan remains appropriate    Co-evaluation             End of Session   Activity Tolerance: Patient tolerated treatment well Patient left: in chair;with call bell/phone within reach;with family/visitor present     Time: 7510-2585 PT Time Calculation (min) (ACUTE ONLY): 29 min  Charges:  $Gait Training: 8-22 mins $Therapeutic Exercise: 8-22 mins                    G Codes:      Claretha Cooper 09/19/2014, 4:21 PM Tresa Endo PT [phone]

## 2014-09-20 LAB — CBC
HCT: 41.1 % (ref 39.0–52.0)
Hemoglobin: 13.2 g/dL (ref 13.0–17.0)
MCH: 32 pg (ref 26.0–34.0)
MCHC: 32.1 g/dL (ref 30.0–36.0)
MCV: 99.8 fL (ref 78.0–100.0)
PLATELETS: 195 10*3/uL (ref 150–400)
RBC: 4.12 MIL/uL — ABNORMAL LOW (ref 4.22–5.81)
RDW: 13.5 % (ref 11.5–15.5)
WBC: 9.8 10*3/uL (ref 4.0–10.5)

## 2014-09-20 LAB — BASIC METABOLIC PANEL
Anion gap: 6 (ref 5–15)
BUN: 13 mg/dL (ref 6–23)
CO2: 32 mmol/L (ref 19–32)
Calcium: 8.8 mg/dL (ref 8.4–10.5)
Chloride: 103 mmol/L (ref 96–112)
Creatinine, Ser: 0.94 mg/dL (ref 0.50–1.35)
GFR calc non Af Amer: 83 mL/min — ABNORMAL LOW (ref >90)
Glucose, Bld: 109 mg/dL — ABNORMAL HIGH (ref 70–99)
POTASSIUM: 4.5 mmol/L (ref 3.5–5.1)
Sodium: 141 mmol/L (ref 135–145)

## 2014-09-20 MED ORDER — SODIUM CHLORIDE 0.9 % IV BOLUS (SEPSIS)
500.0000 mL | Freq: Once | INTRAVENOUS | Status: AC
Start: 2014-09-20 — End: 2014-09-20
  Administered 2014-09-20: 500 mL via INTRAVENOUS

## 2014-09-20 MED ORDER — SODIUM CHLORIDE 0.9 % IV BOLUS (SEPSIS)
500.0000 mL | Freq: Once | INTRAVENOUS | Status: AC
  Administered 2014-09-20: 16:00:00 via INTRAVENOUS

## 2014-09-20 MED ORDER — CYCLOBENZAPRINE HCL 10 MG PO TABS
10.0000 mg | ORAL_TABLET | Freq: Three times a day (TID) | ORAL | Status: DC | PRN
  Administered 2014-09-20 – 2014-09-21 (×2): 10 mg via ORAL
  Filled 2014-09-20 (×2): qty 1

## 2014-09-20 MED ORDER — SODIUM CHLORIDE 0.9 % IV SOLN
INTRAVENOUS | Status: AC
  Administered 2014-09-20: 11:00:00 via INTRAVENOUS

## 2014-09-20 NOTE — Progress Notes (Signed)
[address] notified of patients decrease in B/P and dizziness when up with PT. Orders recieved

## 2014-09-20 NOTE — Progress Notes (Signed)
   Subjective: 2 Days Post-Op Procedure(s) (LRB): LEFT TOTAL HIP ARTHROPLASTY ANTERIOR APPROACH (Left) Patient reports pain as mild.   Patient seen in rounds for Dr. Wynelle Link. Patient is well, but has had some minor complaints of pain in the hip, requiring pain medications Patient is ready to go home today.  Objective: Vital signs in last 24 hours: Temp:  [97.7 F (36.5 C)-98.9 F (37.2 C)] 97.7 F (36.5 C) (04/20 0521) Pulse Rate:  [62-78] 62 (04/20 0521) Resp:  [16-17] 16 (04/20 0521) BP: (108-115)/(54-63) 108/54 mmHg (04/20 0521) SpO2:  [100 %] 100 % (04/20 0521)  Intake/Output from previous day:  Intake/Output Summary (Last 24 hours) at 09/20/14 0802 Last data filed at 09/20/14 0522  Gross per 24 hour  Intake   1440 ml  Output   1550 ml  Net   -110 ml   Labs:  Recent Labs  09/19/14 0420 09/20/14 0505  HGB 13.6 13.2    Recent Labs  09/19/14 0420 09/20/14 0505  WBC 9.7 9.8  RBC 4.24 4.12*  HCT 41.5 41.1  PLT 208 195    Recent Labs  09/19/14 0420 09/20/14 0505  NA 138 141  K 4.5 4.5  CL 104 103  CO2 29 32  BUN 14 13  CREATININE 0.93 0.94  GLUCOSE 170* 109*  CALCIUM 8.6 8.8   No results for input(s): LABPT, INR in the last 72 hours.  EXAM: General - Patient is Alert, Appropriate and Oriented Extremity - Neurovascular intact Sensation intact distally Dorsiflexion/Plantar flexion intact No cellulitis present Incision - clean, dry, no drainage, healing Motor Function - intact, moving foot and toes well on exam.   Assessment/Plan: 2 Days Post-Op Procedure(s) (LRB): LEFT TOTAL HIP ARTHROPLASTY ANTERIOR APPROACH (Left) Procedure(s) (LRB): LEFT TOTAL HIP ARTHROPLASTY ANTERIOR APPROACH (Left) Past Medical History  Diagnosis Date  . IBS (irritable bowel syndrome)   . Rosacea   . Depression   . ADD (attention deficit disorder)   . Esophageal reflux   . BPH (benign prostatic hypertrophy)   . Arthritis   . Cancer     basal skin   . OSA  (obstructive sleep apnea)     central sleep apnea   Principal Problem:   Avascular necrosis of hip Active Problems:   OA (osteoarthritis) of hip  Estimated body mass index is 25.25 kg/(m^2) as calculated from the following:   Height as of this encounter: 5\' 10"  (1.778 m).   Weight as of this encounter: 79.833 kg (176 lb). Up with therapy Discharge home with home health Diet - Cardiac diet Follow up - in 2 weeks Activity - WBAT Disposition - Home Condition Upon Discharge - Good D/C Meds - See DC Summary DVT Prophylaxis - Xarelto  Arlee Muslim, PA-C Orthopaedic Surgery 09/20/2014, 8:02 AM

## 2014-09-20 NOTE — Progress Notes (Signed)
Patient c/o more pain and spasms, and poor results working with therapy. Orders received.  Bethann Punches RN

## 2014-09-20 NOTE — Progress Notes (Signed)
Physical Therapy Treatment Patient Details Name: Tanner [name] MRN: [id number] DOB: July 16, 1945 Today's Date: 09/20/2014    History of Present Illness [name] 09/18/14, s/p R 12/14    PT Comments    POD # 2 PM Session; PT was OOB in chair with wife present; Orthostatic Vitals taken informed RN; Pt used Urinal; Sit to stand to ambulate via walker; ambulate 50 ft; while ambulating pt required physical cue for L LE to propel forward; pt c/o of pain anterior upper thigh and hip; returned back to room; placed into chair and ICE applied.   Follow Up Recommendations  Home health PT;Supervision/Assistance - 24 hour     Equipment Recommendations  None recommended by PT    Recommendations for Other Services       Precautions / Restrictions Precautions Precautions: Fall Restrictions Weight Bearing Restrictions: No    Mobility  Bed Mobility Overal bed mobility: Needs Assistance Bed Mobility: Supine to Sit     Supine to sit: Min guard     General bed mobility comments: PT OOB   Transfers Overall transfer level: Needs assistance Equipment used: Rolling walker (2 wheeled) Transfers: Sit to/from Stand Sit to Stand: Min guard         General transfer comment: 25% VC's on proper hand placement   Ambulation/Gait Ambulation/Gait assistance: Min assist Ambulation Distance (Feet): 45 Feet Assistive device: Rolling walker (2 wheeled) Gait Pattern/deviations: Step-to pattern;Decreased stance time - right Gait velocity: very slow    General Gait Details: VC to step with non OP LE first; Pt c/o of tightness and discomfort in L LE; Orthostatic Vitals taken; Reported to Event organiser    Modified Rankin (Stroke Patients Only)       Balance                                    Cognition Arousal/Alertness: Awake/alert Behavior During Therapy: WFL for tasks assessed/performed;Impulsive Overall Cognitive Status: Within Functional  Limits for tasks assessed                      Exercises      General Comments        Pertinent Vitals/Pain Pain Assessment: 0-10 Pain Score: 4  Pain Location: L Hip; Upper Thigh anterior  Pain Descriptors / Indicators: Tightness;Cramping;Spasm;Grimacing Pain Intervention(s): Monitored during session;Repositioned;Ice applied    Home Living                      Prior Function            PT Goals (current goals can now be found in the care plan section) Progress towards PT goals: Progressing toward goals    Frequency  7X/week    PT Plan      Co-evaluation             End of Session Equipment Utilized During Treatment: Gait belt Activity Tolerance: Patient tolerated treatment well;Patient limited by pain Patient left: in chair;with call bell/phone within reach;with family/visitor present     Time: 1345-1410 PT Time Calculation (min) (ACUTE ONLY): 25 min  Charges:  $Gait Training: 8-22 mins $Therapeutic Activity: 8-22 mins                    G Codes:      Duric,Sreto Student PTA  09/20/2014, 3:59 PM   Reviewed above and agree  Rica Koyanagi  PTA [facility]  Acute  Rehab Pager      [phone]

## 2014-09-20 NOTE — Progress Notes (Signed)
Physical Therapy Treatment Patient Details Name: Rigoberto Repass MRN: [id number] DOB: [birth year] Today's Date: 09/20/2014    History of Present Illness [name] 09/18/14, s/p R 12/14    PT Comments    POD # 2 am session.  Pt premedicated.  Assisted from supine to EOB required extensive time and use of belt to assist L LE.  MAX c/o Muscle Tightness/spasm.  Pt grimacing but stated pain was only about 4/10.  Assisted  With standing with even more grimacing.  Pt only amb 10 feet when c/o MAX dizziness and demonstrated decreased facial color.  BP 77/46 recliner brought to pt and pt placed in supine position.  RN called to room.   Follow Up Recommendations  Home health PT;Supervision/Assistance - 24 hour     Equipment Recommendations  None recommended by PT    Recommendations for Other Services       Precautions / Restrictions Precautions Precautions: Fall Restrictions Weight Bearing Restrictions: No    Mobility  Bed Mobility Overal bed mobility: Needs Assistance Bed Mobility: Supine to Sit     Supine to sit: Min guard     General bed mobility comments: increased, increased time with great difficulty moving L LE getting self to EOB using a belt as a leg lifter.    Transfers Overall transfer level: Needs assistance Equipment used: Rolling walker (2 wheeled) Transfers: Sit to/from Stand Sit to Stand: Min guard         General transfer comment: 25% VC's on proper hand placement   Ambulation/Gait Ambulation/Gait assistance: Min guard Ambulation Distance (Feet): 10 Feet Assistive device: Rolling walker (2 wheeled) Gait Pattern/deviations: Step-to pattern;Decreased stance time - left Gait velocity: very slow    General Gait Details: 25% VC's to step with non Op LE first to see if that worked better.  After amb only 10 feet pt c/o MAX dizziness and difficulty focusing.  BP taken while standing was 77/46.  Recliner brought to pt from behind.  Placed in reclined position and  reported to RN.    Stairs            Wheelchair Mobility    Modified Rankin (Stroke Patients Only)       Balance                                    Cognition Arousal/Alertness: Awake/alert Behavior During Therapy: WFL for tasks assessed/performed;Impulsive Overall Cognitive Status: Within Functional Limits for tasks assessed                      Exercises      General Comments        Pertinent Vitals/Pain Pain Assessment: 0-10 Pain Score: 3  (MAX c/o muscle tightness/cramping more than pain) Pain Location: L hip Pain Descriptors / Indicators: Tightness;Cramping;Spasm Pain Intervention(s): Monitored during session;Premedicated before session;Repositioned;Ice applied    Home Living                      Prior Function            PT Goals (current goals can now be found in the care plan section) Progress towards PT goals: Progressing toward goals    Frequency  7X/week    PT Plan      Co-evaluation             End of Session Equipment Utilized During Treatment: Gait belt  Activity Tolerance: Treatment limited secondary to medical complications (Comment) (hypotension) Patient left: in chair;with call bell/phone within reach;with family/visitor present     Time: 0910-0940 PT Time Calculation (min) (ACUTE ONLY): 30 min  Charges:  $Gait Training: 8-22 mins $Therapeutic Activity: 8-22 mins                    G Codes:      Rica Koyanagi  PTA [facility]  Acute  Rehab Pager      [phone]

## 2014-09-21 LAB — CBC
HEMATOCRIT: 38.2 % — AB (ref 39.0–52.0)
Hemoglobin: 12.4 g/dL — ABNORMAL LOW (ref 13.0–17.0)
MCH: 32.1 pg (ref 26.0–34.0)
MCHC: 32.5 g/dL (ref 30.0–36.0)
MCV: 99 fL (ref 78.0–100.0)
PLATELETS: 182 10*3/uL (ref 150–400)
RBC: 3.86 MIL/uL — ABNORMAL LOW (ref 4.22–5.81)
RDW: 13.7 % (ref 11.5–15.5)
WBC: 9.5 10*3/uL (ref 4.0–10.5)

## 2014-09-21 MED ORDER — OXYCODONE HCL 5 MG PO TABS: 5.0000 mg | ORAL_TABLET | ORAL | Status: DC | PRN

## 2014-09-21 MED ORDER — CYCLOBENZAPRINE HCL 10 MG PO TABS: 10.0000 mg | ORAL_TABLET | Freq: Three times a day (TID) | ORAL | Status: DC | PRN

## 2014-09-21 MED ORDER — RIVAROXABAN 10 MG PO TABS: 10.0000 mg | ORAL_TABLET | Freq: Every day | ORAL | Status: DC

## 2014-09-21 NOTE — Progress Notes (Signed)
   Subjective: 3 Days Post-Op Procedure(s) (LRB): LEFT TOTAL HIP ARTHROPLASTY ANTERIOR APPROACH (Left) Patient reports pain as mild.   Patient seen in rounds by Dr. Wynelle Link.  Had a tough day yesterday. Patient is well, but has had some minor complaints of pain in the hip, requiring pain medications Patient is ready to go home today.  Objective: Vital signs in last 24 hours: Temp:  [98.8 F (37.1 C)-99.1 F (37.3 C)] 98.8 F (37.1 C) (04/21 0715) Pulse Rate:  [68-81] 74 (04/21 0715) Resp:  [15-18] 16 (04/21 0715) BP: (77-136)/(46-70) 115/58 mmHg (04/21 0715) SpO2:  [95 %-100 %] 95 % (04/21 0715)  Intake/Output from previous day:  Intake/Output Summary (Last 24 hours) at 09/21/14 0734 Last data filed at 09/21/14 0400  Gross per 24 hour  Intake   2090 ml  Output   1900 ml  Net    190 ml    Intake/Output this shift: UOP 500 since around [place]   Labs:  Recent Labs  09/19/14 0420 09/20/14 0505 09/21/14 0415  HGB 13.6 13.2 12.4*    Recent Labs  09/20/14 0505 09/21/14 0415  WBC 9.8 9.5  RBC 4.12* 3.86*  HCT 41.1 38.2*  PLT 195 182    Recent Labs  09/19/14 0420 09/20/14 0505  NA 138 141  K 4.5 4.5  CL 104 103  CO2 29 32  BUN 14 13  CREATININE 0.93 0.94  GLUCOSE 170* 109*  CALCIUM 8.6 8.8   No results for input(s): LABPT, INR in the last 72 hours.  EXAM: General - Patient is Alert, Appropriate and Oriented Extremity - Neurovascular intact Sensation intact distally Dorsiflexion/Plantar flexion intact Incision - clean, dry Motor Function - intact, moving foot and toes well on exam.   Assessment/Plan: 3 Days Post-Op Procedure(s) (LRB): LEFT TOTAL HIP ARTHROPLASTY ANTERIOR APPROACH (Left) Procedure(s) (LRB): LEFT TOTAL HIP ARTHROPLASTY ANTERIOR APPROACH (Left) Past Medical History  Diagnosis Date  . IBS (irritable bowel syndrome)   . Rosacea   . Depression   . ADD (attention deficit disorder)   . Esophageal reflux   . BPH (benign prostatic  hypertrophy)   . Arthritis   . Cancer     basal skin   . OSA (obstructive sleep apnea)     central sleep apnea   Principal Problem:   Avascular necrosis of hip Active Problems:   OA (osteoarthritis) of hip  Estimated body mass index is 25.25 kg/(m^2) as calculated from the following:   Height as of this encounter: 5\' 10"  (1.778 m).   Weight as of this encounter: 79.833 kg (176 lb). Up with therapy Discharge home with home health Diet - Cardiac diet Follow up - in 2 weeks Activity - WBAT Disposition - Home Condition Upon Discharge - Good D/C Meds - See DC Summary DVT Prophylaxis - Xarelto  Arlee Muslim, PA-C Orthopaedic Surgery 09/21/2014, 7:34 AM

## 2014-09-21 NOTE — Progress Notes (Signed)
Physical Therapy Treatment Patient Details Name: Tanner [name] MRN: [id number] DOB: 11/21/1945 Today's Date: 09/21/2014    History of Present Illness Tanner [name] 09/18/14, s/p R 12/14    PT Comments    Pt continues to require incr time for mobility but is much better overall today, no dizziness with amb;  Pt ready for D/C from PT standpoint; will have support of wife at home as needed  Follow Up Recommendations  Home health PT;Supervision - Intermittent     Equipment Recommendations  None recommended by PT    Recommendations for Other Services       Precautions / Restrictions Precautions Precautions: Fall Restrictions Weight Bearing Restrictions: No Other Position/Activity Restrictions: WBAT    Mobility  Bed Mobility Overal bed mobility: Needs Assistance Bed Mobility: Supine to Sit     Supine to sit: Supervision     General bed mobility comments: incr time  Transfers Overall transfer level: Needs assistance Equipment used: Rolling walker (2 wheeled) Transfers: Sit to/from Stand Sit to Stand: Min guard;Supervision         General transfer comment: cues for hand placement and LLE position(for incr comfort)  Ambulation/Gait Ambulation/Gait assistance: Supervision Ambulation Distance (Feet): 80 Feet Assistive device: Rolling walker (2 wheeled) Gait Pattern/deviations: Step-to pattern;Antalgic;Decreased weight shift to left Gait velocity: very slow    General Gait Details: pt requires incr time, prefers to step with non-op/RLE first, able to incr step length and improved wt shift to Tanner last 15'   Stairs Stairs: Yes Stairs assistance: Supervision Stair Management: No rails;Step to pattern;Forwards;With crutches Number of Stairs: 3 General stair comments: incr time, cues for sequence  Wheelchair Mobility    Modified Rankin (Stroke Patients Only)       Balance                                    Cognition Arousal/Alertness:  Awake/alert Behavior During Therapy: WFL for tasks assessed/performed;Impulsive Overall Cognitive Status: Within Functional Limits for tasks assessed                      Exercises Total Joint Exercises Heel Slides: AAROM;Left;5 reps    General Comments        Pertinent Vitals/Pain Pain Assessment: 0-10 Pain Location: Tanner hip Pain Descriptors / Indicators: Discomfort;Grimacing;Guarding;Tightness Pain Intervention(s): Limited activity within patient's tolerance;Monitored during session;Patient requesting pain meds-RN notified;Repositioned;Ice applied    Home Living                      Prior Function            PT Goals (current goals can now be found in the care plan section) Acute Rehab PT Goals Patient Stated Goal: to walk without pain PT Goal Formulation: With patient Time For Goal Achievement: 09/21/14 Potential to Achieve Goals: Good Progress towards PT goals: Progressing toward goals    Frequency  7X/week    PT Plan Current plan remains appropriate    Co-evaluation             End of Session Equipment Utilized During Treatment: Gait belt Activity Tolerance: Patient tolerated treatment well;Patient limited by pain Patient left: in chair;with call bell/phone within reach;with family/visitor present     Time: 0865-7846 PT Time Calculation (min) (ACUTE ONLY): 41 min  Charges:  $Gait Training: 23-37 mins $Therapeutic Activity: 8-22 mins  G CodesKenyon [name] 09/21/2014, 11:01 AM

## 2014-10-19 NOTE — Discharge Summary (Signed)
Physician Discharge Summary   Patient ID: Tanner [name] MRN: [id number] DOB/AGE: 1946-04-28 69 y.o.  Admit date: 09/18/2014 Discharge date: 09/21/2014  Primary Diagnosis:  Avascular necrosis of the Left hip.   Admission Diagnoses:  Past Medical History  Diagnosis Date  . IBS (irritable bowel syndrome)   . Rosacea   . Depression   . ADD (attention deficit disorder)   . Esophageal reflux   . BPH (benign prostatic hypertrophy)   . Arthritis   . Cancer     basal skin   . OSA (obstructive sleep apnea)     central sleep apnea   Discharge Diagnoses:   Principal Problem:   Avascular necrosis of hip Active Problems:   OA (osteoarthritis) of hip  Estimated body mass index is 25.25 kg/(m^2) as calculated from the following:   Height as of this encounter: _0  (1.778 m).   Weight as of this encounter: 79.833 kg (176 lb).  Procedure(s) (LRB): LEFT TOTAL HIP ARTHROPLASTY ANTERIOR APPROACH (Left)   Consults: None  HPI: Tanner [name] is a 69 y.o. male who has osteonecrosis  of his Left hip with progressively worsening pain and  dysfunction.The patient has failed nonoperative management and presents for  total hip arthroplasty.  Laboratory Data: Admission on 09/18/2014, Discharged on 09/21/2014  Component Date Value Ref Range Status  . WBC 09/19/2014 9.7  4.0 - 10.5 K/uL Final  . RBC 09/19/2014 4.24  4.22 - 5.81 MIL/uL Final  . Hemoglobin 09/19/2014 13.6  13.0 - 17.0 g/dL Final  . HCT 09/19/2014 41.5  39.0 - 52.0 % Final  . MCV 09/19/2014 97.9  78.0 - 100.0 fL Final  . MCH 09/19/2014 32.1  26.0 - 34.0 pg Final  . MCHC 09/19/2014 32.8  30.0 - 36.0 g/dL Final  . RDW 09/19/2014 13.1  11.5 - 15.5 % Final  . Platelets 09/19/2014 208  150 - 400 K/uL Final  . Sodium 09/19/2014 138  135 - 145 mmol/L Final  . Potassium 09/19/2014 4.5  3.5 - 5.1 mmol/L Final  . Chloride 09/19/2014 104  96 - 112 mmol/L Final  . CO2 09/19/2014 29  19 - 32 mmol/L Final  . Glucose, Bld 09/19/2014  170* 70 - 99 mg/dL Final  . BUN 09/19/2014 14  6 - 23 mg/dL Final  . Creatinine, Ser 09/19/2014 0.93  0.50 - 1.35 mg/dL Final  . Calcium 09/19/2014 8.6  8.4 - 10.5 mg/dL Final  . GFR calc non Af Amer 09/19/2014 83* >90 mL/min Final  . GFR calc Af Amer 09/19/2014 >90  >90 mL/min Final   Comment: (NOTE) The eGFR has been calculated using the CKD EPI equation. This calculation has not been validated in all clinical situations. eGFR's persistently <90 mL/min signify possible Chronic Kidney Disease.   . Anion gap 09/19/2014 5  5 - 15 Final  . WBC 09/20/2014 9.8  4.0 - 10.5 K/uL Final  . RBC 09/20/2014 4.12* 4.22 - 5.81 MIL/uL Final  . Hemoglobin 09/20/2014 13.2  13.0 - 17.0 g/dL Final  . HCT 09/20/2014 41.1  39.0 - 52.0 % Final  . MCV 09/20/2014 99.8  78.0 - 100.0 fL Final  . MCH 09/20/2014 32.0  26.0 - 34.0 pg Final  . MCHC 09/20/2014 32.1  30.0 - 36.0 g/dL Final  . RDW 09/20/2014 13.5  11.5 - 15.5 % Final  . Platelets 09/20/2014 195  150 - 400 K/uL Final  . Sodium 09/20/2014 141  135 - 145 mmol/L Final  . Potassium 09/20/2014 4.5  3.5 - 5.1 mmol/L Final  . Chloride 09/20/2014 103  96 - 112 mmol/L Final  . CO2 09/20/2014 32  19 - 32 mmol/L Final  . Glucose, Bld 09/20/2014 109* 70 - 99 mg/dL Final  . BUN 09/20/2014 13  6 - 23 mg/dL Final  . Creatinine, Ser 09/20/2014 0.94  0.50 - 1.35 mg/dL Final  . Calcium 09/20/2014 8.8  8.4 - 10.5 mg/dL Final  . GFR calc non Af Amer 09/20/2014 83* >90 mL/min Final  . GFR calc Af Amer 09/20/2014 >90  >90 mL/min Final   Comment: (NOTE) The eGFR has been calculated using the CKD EPI equation. This calculation has not been validated in all clinical situations. eGFR's persistently <90 mL/min signify possible Chronic Kidney Disease.   . Anion gap 09/20/2014 6  5 - 15 Final  . WBC 09/21/2014 9.5  4.0 - 10.5 K/uL Final  . RBC 09/21/2014 3.86* 4.22 - 5.81 MIL/uL Final  . Hemoglobin 09/21/2014 12.4* 13.0 - 17.0 g/dL Final  . HCT 09/21/2014 38.2* 39.0 -  52.0 % Final  . MCV 09/21/2014 99.0  78.0 - 100.0 fL Final  . MCH 09/21/2014 32.1  26.0 - 34.0 pg Final  . MCHC 09/21/2014 32.5  30.0 - 36.0 g/dL Final  . RDW 09/21/2014 13.7  11.5 - 15.5 % Final  . Platelets 09/21/2014 182  150 - 400 K/uL Final  Hospital Outpatient Visit on 09/15/2014  Component Date Value Ref Range Status  . aPTT 09/15/2014 31  24 - 37 seconds Final  . WBC 09/15/2014 4.8  4.0 - 10.5 K/uL Final  . RBC 09/15/2014 4.69  4.22 - 5.81 MIL/uL Final  . Hemoglobin 09/15/2014 15.1  13.0 - 17.0 g/dL Final  . HCT 09/15/2014 46.3  39.0 - 52.0 % Final  . MCV 09/15/2014 98.7  78.0 - 100.0 fL Final  . MCH 09/15/2014 32.2  26.0 - 34.0 pg Final  . MCHC 09/15/2014 32.6  30.0 - 36.0 g/dL Final  . RDW 09/15/2014 13.3  11.5 - 15.5 % Final  . Platelets 09/15/2014 212  150 - 400 K/uL Final  . Prothrombin Time 09/15/2014 12.8  11.6 - 15.2 seconds Final  . INR 09/15/2014 0.95  0.00 - 1.49 Final  . ABO/RH(D) 09/15/2014 O NEG   Final  . Antibody Screen 09/15/2014 NEG   Final  . Sample Expiration 09/15/2014 09/21/2014   Final  . Color, Urine 09/15/2014 YELLOW  YELLOW Final  . APPearance 09/15/2014 CLEAR  CLEAR Final  . Specific Gravity, Urine 09/15/2014 1.016  1.005 - 1.030 Final  . pH 09/15/2014 6.0  5.0 - 8.0 Final  . Glucose, UA 09/15/2014 NEGATIVE  NEGATIVE mg/dL Final  . Hgb urine dipstick 09/15/2014 NEGATIVE  NEGATIVE Final  . Bilirubin Urine 09/15/2014 NEGATIVE  NEGATIVE Final  . Ketones, ur 09/15/2014 NEGATIVE  NEGATIVE mg/dL Final  . Protein, ur 09/15/2014 NEGATIVE  NEGATIVE mg/dL Final  . Urobilinogen, UA 09/15/2014 0.2  0.0 - 1.0 mg/dL Final  . Nitrite 09/15/2014 NEGATIVE  NEGATIVE Final  . Leukocytes, UA 09/15/2014 NEGATIVE  NEGATIVE Final   MICROSCOPIC NOT DONE ON URINES WITH NEGATIVE PROTEIN, BLOOD, LEUKOCYTES, NITRITE, OR GLUCOSE <1000 mg/dL.  Marland Kitchen MRSA, PCR 09/15/2014 NEGATIVE  NEGATIVE Final  . Staphylococcus aureus 09/15/2014 NEGATIVE  NEGATIVE Final   Comment:        The  Xpert SA Assay (FDA approved for NASAL specimens in patients over 52 years of age), is one component of a comprehensive surveillance program.  Test performance  has been validated by [facility] for patients greater than or equal to 72 year old. It is not intended to diagnose infection nor to guide or monitor treatment.      X-Rays:No results found.  EKG:No orders found for this or any previous visit.   Hospital Course: Patient was admitted to [facility] and taken to the OR and underwent the above state procedure without complications.  Patient tolerated the procedure well and was later transferred to the recovery room and then to the orthopaedic floor for postoperative care.  They were given PO and IV analgesics for pain control following their surgery.  They were given 24 hours of postoperative antibiotics of  Anti-infectives    Start     Dose/Rate Route Frequency Ordered Stop   09/18/14 2100  ceFAZolin (ANCEF) IVPB 2 g/50 mL premix     2 g 100 mL/hr over 30 Minutes Intravenous Every 6 hours 09/18/14 1737 09/19/14 0502   09/18/14 1215  ceFAZolin (ANCEF) IVPB 2 g/50 mL premix     2 g 100 mL/hr over 30 Minutes Intravenous On call to O.R. 09/18/14 1201 09/18/14 1505     and started on DVT prophylaxis in the form of Xarelto.   PT and OT were ordered for total hip protocol.  The patient was allowed to be WBAT with therapy. Discharge planning was consulted to help with postop disposition and equipment needs.  Patient had a good night on the evening of surgery.  They started to get up OOB with therapy on day one.  Hemovac drain was pulled without difficulty.  Continued to work with therapy into day two but had a lot of pain and tough day so stayed another day.  Dressing was changed on day two and the incision was healing well.  By day three, the patient had progressed with therapy and meeting their goals.  Incision was healing well.  Patient was seen in rounds and was ready to go  home.  Discharge home with home health Diet - Cardiac diet Follow up - in 2 weeks Activity - WBAT Disposition - Home Condition Upon Discharge - Good D/C Meds - See DC Summary DVT Prophylaxis - Xarelto      Discharge Instructions    Call MD / Call 911    Complete by:  As directed   If you experience chest pain or shortness of breath, CALL 911 and be transported to the hospital emergency room.  If you develope a fever above 101 F, pus (white drainage) or increased drainage or redness at the wound, or calf pain, call your surgeon's office.     Change dressing    Complete by:  As directed   You may change your dressing dressing daily with sterile 4 x 4 inch gauze dressing and paper tape.  Do not submerge the incision under water.     Constipation Prevention    Complete by:  As directed   Drink plenty of fluids.  Prune juice may be helpful.  You may use a stool softener, such as Colace (over the counter) 100 mg twice a day.  Use MiraLax (over the counter) for constipation as needed.     Diet - low sodium heart healthy    Complete by:  As directed      Discharge instructions    Complete by:  As directed   Pick up stool softner and laxative for home use following surgery while on pain medications. Do not submerge incision under water.  Please use good hand washing techniques while changing dressing each day. May shower starting three days after surgery. Please use a clean towel to pat the incision dry following showers. Continue to use ice for pain and swelling after surgery. Do not use any lotions or creams on the incision until instructed by your surgeon.  Total Hip Protocol.  Take Xarelto for two and a half more weeks, then discontinue Xarelto. Once the patient has completed the Xarelto, they may resume the 81 mg Aspirin.  Postoperative Constipation Protocol  Constipation - defined medically as fewer than three stools per week and severe constipation as less than one stool per  week.  One of the most common issues patients have following surgery is constipation.  Even if you have a regular bowel pattern at home, your normal regimen is likely to be disrupted due to multiple reasons following surgery.  Combination of anesthesia, postoperative narcotics, change in appetite and fluid intake all can affect your bowels.  In order to avoid complications following surgery, here are some recommendations in order to help you during your recovery period.  Colace (docusate) - Pick up an over-the-counter form of Colace or another stool softener and take twice a day as long as you are requiring postoperative pain medications.  Take with a full glass of water daily.  If you experience loose stools or diarrhea, hold the colace until you stool forms back up.  If your symptoms do not get better within 1 week or if they get worse, check with your doctor.  Dulcolax (bisacodyl) - Pick up over-the-counter and take as directed by the product packaging as needed to assist with the movement of your bowels.  Take with a full glass of water.  Use this product as needed if not relieved by Colace only.   MiraLax (polyethylene glycol) - Pick up over-the-counter to have on hand.  MiraLax is a solution that will increase the amount of water in your bowels to assist with bowel movements.  Take as directed and can mix with a glass of water, juice, soda, coffee, or tea.  Take if you go more than two days without a movement. Do not use MiraLax more than once per day. Call your doctor if you are still constipated or irregular after using this medication for 7 days in a row.  If you continue to have problems with postoperative constipation, please contact the office for further assistance and recommendations.  If you experience "the worst abdominal pain ever" or develop nausea or vomiting, please contact the office immediatly for further recommendations for treatment.     Do not sit on low chairs, stoools or  toilet seats, as it may be difficult to get up from low surfaces    Complete by:  As directed      Driving restrictions    Complete by:  As directed   No driving until released by the physician.     Follow the hip precautions as taught in Physical Therapy    Complete by:  As directed      Increase activity slowly as tolerated    Complete by:  As directed      Lifting restrictions    Complete by:  As directed   No lifting until released by the physician.     Patient may shower    Complete by:  As directed   You may shower without a dressing once there is no drainage.  Do not wash over the wound.  If drainage remains, do not shower until drainage stops.     TED hose    Complete by:  As directed   Use stockings (TED hose) for 3 weeks on both leg(s).  You may remove them at night for sleeping.     Weight bearing as tolerated    Complete by:  As directed   Laterality:  left  Extremity:  Lower            Medication List    STOP taking these medications        aspirin 81 MG tablet     cholecalciferol 1000 UNITS tablet  Commonly known as:  VITAMIN D     ibuprofen 200 MG tablet  Commonly known as:  ADVIL,MOTRIN      TAKE these medications        amphetamine-dextroamphetamine 30 MG 24 hr capsule  Commonly known as:  ADDERALL XR  Take 30 mg by mouth every morning.     cyclobenzaprine 10 MG tablet  Commonly known as:  FLEXERIL  Take 1 tablet (10 mg total) by mouth 3 (three) times daily as needed for muscle spasms.     finasteride 5 MG tablet  Commonly known as:  PROSCAR  Take 5 mg by mouth daily.     fluticasone 50 MCG/ACT nasal spray  Commonly known as:  FLONASE  Place 2 sprays into both nostrils daily.     minocycline 100 MG capsule  Commonly known as:  MINOCIN,DYNACIN  Take 100 mg by mouth daily.     omeprazole 40 MG capsule  Commonly known as:  PRILOSEC  Take 40 mg by mouth daily.     oxyCODONE 5 MG immediate release tablet  Commonly known as:  Oxy  IR/ROXICODONE  Take 1-2 tablets (5-10 mg total) by mouth every 3 (three) hours as needed for moderate pain, severe pain or breakthrough pain.     rivaroxaban 10 MG Tabs tablet  Commonly known as:  XARELTO  - Take 1 tablet (10 mg total) by mouth daily with breakfast. Take Xarelto for two and a half more weeks, then discontinue Xarelto.  - Once the patient has completed the Xarelto, they may resume the 81 mg Aspirin.     simvastatin 10 MG tablet  Commonly known as:  ZOCOR  Take 10 mg by mouth every evening.     VIIBRYD 20 MG Tabs  Generic drug:  Vilazodone HCl  Take 20 mg by mouth every morning.       Follow-up Information    Follow up with Gearlean Alf, MD. Schedule an appointment as soon as possible for a visit in 2 weeks.   Specialty:  Orthopedic Surgery   Why:  Call office at [phone] to set appointment with Dr. Wynelle Link in two weeks for follow up.   Contact information:   [address] [place] [address] [phone]       Signed: Arlee Muslim, PA-C Orthopaedic Surgery 10/19/2014, 8:26 AM

## 2014-11-22 ENCOUNTER — Emergency Department (HOSPITAL_COMMUNITY)
Admission: EM | Admit: 2014-11-22 | Discharge: 2014-11-22 | Disposition: A | Discharge status: Home / Self Care | Payer: Medicare Other | Attending: Emergency Medicine | Admitting: Emergency Medicine

## 2014-11-22 DIAGNOSIS — Z85828 Personal history of other malignant neoplasm of skin: Secondary | ICD-10-CM | POA: Insufficient documentation

## 2014-11-22 DIAGNOSIS — K219 Gastro-esophageal reflux disease without esophagitis: Secondary | ICD-10-CM | POA: Diagnosis not present

## 2014-11-22 DIAGNOSIS — Z872 Personal history of diseases of the skin and subcutaneous tissue: Secondary | ICD-10-CM | POA: Diagnosis not present

## 2014-11-22 DIAGNOSIS — F329 Major depressive disorder, single episode, unspecified: Secondary | ICD-10-CM | POA: Diagnosis not present

## 2014-11-22 DIAGNOSIS — Z8669 Personal history of other diseases of the nervous system and sense organs: Secondary | ICD-10-CM | POA: Insufficient documentation

## 2014-11-22 DIAGNOSIS — L03114 Cellulitis of left upper limb: Secondary | ICD-10-CM | POA: Diagnosis not present

## 2014-11-22 DIAGNOSIS — F909 Attention-deficit hyperactivity disorder, unspecified type: Secondary | ICD-10-CM | POA: Diagnosis not present

## 2014-11-22 DIAGNOSIS — Z7901 Long term (current) use of anticoagulants: Secondary | ICD-10-CM | POA: Diagnosis not present

## 2014-11-22 DIAGNOSIS — Z7982 Long term (current) use of aspirin: Secondary | ICD-10-CM | POA: Insufficient documentation

## 2014-11-22 DIAGNOSIS — Z87448 Personal history of other diseases of urinary system: Secondary | ICD-10-CM | POA: Insufficient documentation

## 2014-11-22 DIAGNOSIS — Z7951 Long term (current) use of inhaled steroids: Secondary | ICD-10-CM | POA: Diagnosis not present

## 2014-11-22 DIAGNOSIS — M1990 Unspecified osteoarthritis, unspecified site: Secondary | ICD-10-CM | POA: Diagnosis not present

## 2014-11-22 DIAGNOSIS — Z79899 Other long term (current) drug therapy: Secondary | ICD-10-CM | POA: Insufficient documentation

## 2014-11-22 MED ORDER — DIPHENHYDRAMINE HCL 50 MG/ML IJ SOLN
25.0000 mg | Freq: Once | INTRAMUSCULAR | Status: AC
  Administered 2014-11-22: 25 mg via INTRAVENOUS
  Filled 2014-11-22: qty 1

## 2014-11-22 MED ORDER — CEPHALEXIN 500 MG PO CAPS: 500.0000 mg | ORAL_CAPSULE | Freq: Four times a day (QID) | ORAL | Status: DC

## 2014-11-22 MED ORDER — CEFAZOLIN SODIUM 1-5 GM-% IV SOLN
1.0000 g | Freq: Once | INTRAVENOUS | Status: AC
  Administered 2014-11-22: 1 g via INTRAVENOUS
  Filled 2014-11-22: qty 50

## 2014-11-22 MED ORDER — METHYLPREDNISOLONE SODIUM SUCC 125 MG IJ SOLR
125.0000 mg | Freq: Once | INTRAMUSCULAR | Status: AC
  Administered 2014-11-22: 125 mg via INTRAVENOUS
  Filled 2014-11-22: qty 2

## 2014-11-22 NOTE — ED Notes (Signed)
Pt was seen by Urgent Care and sent here for further evaluation of his left hand, he was stung by a wasp and possibly bit by a spider, his left hand is red and swollen

## 2014-11-22 NOTE — ED Provider Notes (Signed)
CSN: [id number]     Arrival date & time 11/22/14  2102 History   First MD Initiated Contact with Patient 11/22/14 2157     Chief Complaint  Patient presents with  . Cellulitis      HPI  Patient just evaluation of redness of his left hand. He was stung by yellow jacket that he saw on his finger on Monday, 48 hours ago. Yesterday morning he had no symptoms in his hand appeared and felt normal to him. He waking this morning with redness pain itching and redness of the dorsum of his left hand which is worsened and now has a streak to his wrist. Presents here. Fevers chills. No systemic signs of illness.  Past Medical History  Diagnosis Date  . IBS (irritable bowel syndrome)   . Rosacea   . Depression   . ADD (attention deficit disorder)   . Esophageal reflux   . BPH (benign prostatic hypertrophy)   . Arthritis   . Cancer     basal skin   . OSA (obstructive sleep apnea)     central sleep apnea   Past Surgical History  Procedure Laterality Date  . Left inguinal hernia repair    . Skin cancer removed    . Multiple orthopedic surgery    . Mouth surgery      teeth removal/implants  . Jaw implants    . Cataract extraction    . Tonsillectomy    . Left clavical    . Right knee arthroscopy    . Planter fascia    . Left foot neuroma    . Right shoulder    . Right elbow    . Umbilical hernia repair  03/01/2013  . Total hip arthroplasty Right 05/11/2013    Procedure: RIGHT TOTAL HIP ARTHROPLASTY ANTERIOR APPROACH;  Surgeon: Gearlean Alf, MD;  Location: [facility];  Service: Orthopedics;  Laterality: Right;  WOUND CLASSIFICATION-CLEAN  . Hernia repair       2001, 2014  . Total hip arthroplasty Left 09/18/2014    Procedure: LEFT TOTAL HIP ARTHROPLASTY ANTERIOR APPROACH;  Surgeon: Gaynelle Arabian, MD;  Location: [facility];  Service: Orthopedics;  Laterality: Left;   Family History  Problem Relation Age of Onset  . Heart attack Father 18  . Heart disease Father   . Lung disease Mother 53    . Diabetes Brother    History  Substance Use Topics  . Smoking status: Never Smoker   . Smokeless tobacco: Never Used  . Alcohol Use: Yes     Comment: occassionally    Review of Systems  Constitutional: Negative for fever, chills, diaphoresis, appetite change and fatigue.  HENT: Negative for mouth sores, sore throat and trouble swallowing.   Eyes: Negative for visual disturbance.  Respiratory: Negative for cough, chest tightness, shortness of breath and wheezing.   Cardiovascular: Negative for chest pain.  Gastrointestinal: Negative for nausea, vomiting, abdominal pain, diarrhea and abdominal distention.  Endocrine: Negative for polydipsia, polyphagia and polyuria.  Genitourinary: Negative for dysuria, frequency and hematuria.  Musculoskeletal: Negative for gait problem.  Skin: Positive for rash. Negative for color change and pallor.  Neurological: Negative for dizziness, syncope, light-headedness and headaches.  Hematological: Does not bruise/bleed easily.  Psychiatric/Behavioral: Negative for behavioral problems and confusion.      Allergies  Valium  Home Medications   Prior to Admission medications   Medication Sig Start Date End Date Taking? Authorizing Provider  amphetamine-dextroamphetamine (ADDERALL XR) 30 MG 24 hr capsule Take  30 mg by mouth every morning.    Yes Historical Provider, MD  aspirin 81 MG tablet Take 81 mg by mouth daily.   Yes Historical Provider, MD  cholecalciferol (VITAMIN D) 1000 UNITS tablet Take 1,000 Units by mouth daily.   Yes Historical Provider, MD  finasteride (PROSCAR) 5 MG tablet Take 5 mg by mouth daily.   Yes Historical Provider, MD  fluticasone (FLONASE) 50 MCG/ACT nasal spray Place 2 sprays into both nostrils daily.   Yes Historical Provider, MD  minocycline (MINOCIN,DYNACIN) 100 MG capsule Take 100 mg by mouth daily.  09/19/13  Yes Historical Provider, MD  omeprazole (PRILOSEC) 40 MG capsule Take 40 mg by mouth daily.   Yes Historical  Provider, MD  simvastatin (ZOCOR) 10 MG tablet Take 10 mg by mouth every evening.    Yes Historical Provider, MD  Vilazodone HCl (VIIBRYD) 20 MG TABS Take 20 mg by mouth every morning.    Yes Historical Provider, MD  cephALEXin (KEFLEX) 500 MG capsule Take 1 capsule (500 mg total) by mouth 4 (four) times daily. 11/22/14   Tanna Furry, MD  cyclobenzaprine (FLEXERIL) 10 MG tablet Take 1 tablet (10 mg total) by mouth 3 (three) times daily as needed for muscle spasms. Patient not taking: Reported on 11/22/2014 09/21/14   Arlee Muslim, PA-C  oxyCODONE (OXY IR/ROXICODONE) 5 MG immediate release tablet Take 1-2 tablets (5-10 mg total) by mouth every 3 (three) hours as needed for moderate pain, severe pain or breakthrough pain. Patient not taking: Reported on 11/22/2014 09/21/14   Arlee Muslim, PA-C  rivaroxaban (XARELTO) 10 MG TABS tablet Take 1 tablet (10 mg total) by mouth daily with breakfast. Take Xarelto for two and a half more weeks, then discontinue Xarelto. Once the patient has completed the Xarelto, they may resume the 81 mg Aspirin. Patient not taking: Reported on 11/22/2014 09/21/14   Arlee Muslim, PA-C   BP 124/73 mmHg  Pulse 66  Temp(Src) 97.6 F (36.4 C) (Oral)  Resp 20  SpO2 100% Physical Exam  Constitutional: He is oriented to person, place, and time. He appears well-developed and well-nourished. No distress.  HENT:  Head: Normocephalic.  Eyes: Conjunctivae are normal. Pupils are equal, round, and reactive to light. No scleral icterus.  Neck: Normal range of motion. Neck supple. No thyromegaly present.  Cardiovascular: Normal rate and regular rhythm.  Exam reveals no gallop and no friction rub.   No murmur heard. Pulmonary/Chest: Effort normal and breath sounds normal. No respiratory distress. He has no wheezes. He has no rales.  Abdominal: Soft. Bowel sounds are normal. He exhibits no distension. There is no tenderness. There is no rebound.  Musculoskeletal: Normal range of motion.        Hands: Neurological: He is alert and oriented to person, place, and time.  Skin: Skin is warm and dry. No rash noted.  Psychiatric: He has a normal mood and affect. His behavior is normal.    ED Course  Procedures (including critical care time) Labs Review Labs Reviewed - No data to display  Imaging Review No results found.   EKG Interpretation None      MDM   Final diagnoses:  Cellulitis of left upper extremity    Clinically he has a lymphangitis. A localized cellulitis. Given IV Ancef, also Medrol and Benadryl. Very likely a cellulitis as he had no response within the first 24 hours. He continues to have some itching in the area. I asked him to continue his Benadryl and ice as  as assaulted at home. 7 day course of Keflex. Recheck any worsening lymphangitis fevers or other symptoms.    Tanna Furry, MD 11/22/14 2300

## 2014-11-22 NOTE — Discharge Instructions (Signed)
Your red streaks extending to your wrist should improve within the next 24 hours. The remainder of the cellulitis may take 2-3 days to slowly show improvement. If you develop fever, or worsening streaks up your arms and recheck here please.  Cellulitis Cellulitis is an infection of the skin and the tissue under the skin. The infected area is usually red and tender. This happens most often in the arms and lower legs. HOME CARE   Take your antibiotic medicine as told. Finish the medicine even if you start to feel better.  Keep the infected arm or leg raised (elevated).  Put a warm cloth on the area up to 4 times per day.  Only take medicines as told by your doctor.  Keep all doctor visits as told. GET HELP IF:  You see red streaks on the skin coming from the infected area.  Your red area gets bigger or turns a dark color.  Your bone or joint under the infected area is painful after the skin heals.  Your infection comes back in the same area or different area.  You have a puffy (swollen) bump in the infected area.  You have new symptoms.  You have a fever. GET HELP RIGHT AWAY IF:   You feel very sleepy.  You throw up (vomit) or have watery poop (diarrhea).  You feel sick and have muscle aches and pains. MAKE SURE YOU:   Understand these instructions.  Will watch your condition.  Will get help right away if you are not doing well or get worse. Document Released: 11/05/2007 Document Revised: 10/03/2013 Document Reviewed: 08/04/2011 [facility] Patient Information 2015 [place], [place]. This information is not intended to replace advice given to you by your health care provider. Make sure you discuss any questions you have with your health care provider.

## 2014-11-29 ENCOUNTER — Encounter: Payer: Self-pay | Admitting: Internal Medicine

## 2014-12-01 ENCOUNTER — Emergency Department (HOSPITAL_BASED_OUTPATIENT_CLINIC_OR_DEPARTMENT_OTHER): Payer: Medicare Other

## 2014-12-01 ENCOUNTER — Encounter (HOSPITAL_BASED_OUTPATIENT_CLINIC_OR_DEPARTMENT_OTHER): Payer: Self-pay | Admitting: *Deleted

## 2014-12-01 ENCOUNTER — Emergency Department (HOSPITAL_BASED_OUTPATIENT_CLINIC_OR_DEPARTMENT_OTHER)
Admission: EM | Admit: 2014-12-01 | Discharge: 2014-12-01 | Disposition: A | Discharge status: Home / Self Care | Payer: Medicare Other | Attending: Emergency Medicine | Admitting: Emergency Medicine

## 2014-12-01 DIAGNOSIS — M1990 Unspecified osteoarthritis, unspecified site: Secondary | ICD-10-CM | POA: Insufficient documentation

## 2014-12-01 DIAGNOSIS — Z79899 Other long term (current) drug therapy: Secondary | ICD-10-CM | POA: Diagnosis not present

## 2014-12-01 DIAGNOSIS — F329 Major depressive disorder, single episode, unspecified: Secondary | ICD-10-CM | POA: Insufficient documentation

## 2014-12-01 DIAGNOSIS — R609 Edema, unspecified: Secondary | ICD-10-CM

## 2014-12-01 DIAGNOSIS — Z7951 Long term (current) use of inhaled steroids: Secondary | ICD-10-CM | POA: Diagnosis not present

## 2014-12-01 DIAGNOSIS — Z872 Personal history of diseases of the skin and subcutaneous tissue: Secondary | ICD-10-CM | POA: Insufficient documentation

## 2014-12-01 DIAGNOSIS — N400 Enlarged prostate without lower urinary tract symptoms: Secondary | ICD-10-CM | POA: Diagnosis not present

## 2014-12-01 DIAGNOSIS — Z7982 Long term (current) use of aspirin: Secondary | ICD-10-CM | POA: Insufficient documentation

## 2014-12-01 DIAGNOSIS — K219 Gastro-esophageal reflux disease without esophagitis: Secondary | ICD-10-CM | POA: Diagnosis not present

## 2014-12-01 DIAGNOSIS — F901 Attention-deficit hyperactivity disorder, predominantly hyperactive type: Secondary | ICD-10-CM | POA: Insufficient documentation

## 2014-12-01 DIAGNOSIS — K589 Irritable bowel syndrome without diarrhea: Secondary | ICD-10-CM | POA: Insufficient documentation

## 2014-12-01 DIAGNOSIS — Z85828 Personal history of other malignant neoplasm of skin: Secondary | ICD-10-CM | POA: Insufficient documentation

## 2014-12-01 DIAGNOSIS — Z792 Long term (current) use of antibiotics: Secondary | ICD-10-CM | POA: Diagnosis not present

## 2014-12-01 DIAGNOSIS — I82612 Acute embolism and thrombosis of superficial veins of left upper extremity: Secondary | ICD-10-CM | POA: Diagnosis not present

## 2014-12-01 DIAGNOSIS — M7989 Other specified soft tissue disorders: Secondary | ICD-10-CM | POA: Diagnosis present

## 2014-12-01 MED ORDER — DOXYCYCLINE HYCLATE 100 MG PO CAPS: 100.0000 mg | ORAL_CAPSULE | Freq: Two times a day (BID) | ORAL | Status: DC

## 2014-12-01 MED ORDER — NAPROXEN SODIUM 275 MG PO TABS: 275.0000 mg | ORAL_TABLET | Freq: Two times a day (BID) | ORAL | Status: DC

## 2014-12-01 NOTE — Discharge Instructions (Signed)
Increase your aspirin to 324mg  by mouth once daily.   Phlebitis Phlebitis is soreness and swelling (inflammation) of a vein. This can occur in your arms, legs, or torso (trunk), as well as deeper inside your body. Phlebitis is usually not serious when it occurs close to the surface of the body. However, it can cause serious problems when it occurs in a vein deeper inside the body. CAUSES  Phlebitis can be triggered by various things, including:   Reduced blood flow through your veins. This can happen with:  Bed rest over a long period.  Long-distance travel.  Injury.  Surgery.  Being overweight (obese) or pregnant.  Having an IV tube put in the vein and getting certain medicines through the vein.  Cancer and cancer treatment.  Use of illegal drugs taken through the vein.  Inflammatory diseases.  Inherited (genetic) diseases that increase the risk of blood clots.  Hormone therapy, such as birth control pills. SIGNS AND SYMPTOMS   Red, tender, swollen, and painful area on your skin. Usually, the area will be long and narrow.  Firmness along the center of the affected area. This can indicate that a blood clot has formed.  Low-grade fever. DIAGNOSIS  A health care provider can usually diagnose phlebitis by examining the affected area and asking about your symptoms. To check for infection or blood clots, your health care provider may order blood tests or an ultrasound exam of the area. Blood tests and your family history may also indicate if you have an underlying genetic disease that causes blood clots. Occasionally, a piece of tissue is taken from the body (biopsy sample) if an unusual cause of phlebitis is suspected. TREATMENT  Treatment will vary depending on the severity of the condition and the area of the body affected. Treatment may include:  Use of a warm compress or heating pad.  Use of compression stockings or bandages.  Anti-inflammatory medicines.  Removal of  any IV tube that may be causing the problem.  Medicines that kill germs (antibiotics) if an infection is present.  Blood-thinning medicines if a blood clot is suspected or present.  In rare cases, surgery may be needed to remove damaged sections of vein. HOME CARE INSTRUCTIONS   Only take over-the-counter or prescription medicines as directed by your health care provider. Take all medicines exactly as prescribed.  Raise (elevate) the affected area above the level of your heart as directed by your health care provider.  Apply a warm compress or heating pad to the affected area as directed by your health care provider. Do not sleep with the heating pad.  Use compression stockings or bandages as directed. These will speed healing and prevent the condition from coming back.  If you are on blood thinners:  Get follow-up blood tests as directed by your health care provider.  Check with your health care provider before using any new medicines.  Carry a medical alert card or wear your medical alert jewelry to show that you are on blood thinners.  For phlebitis in the legs:  Avoid prolonged standing or bed rest.  Keep your legs moving. Raise your legs when sitting or lying.  Do not smoke.  Women, particularly those over the age of 59, should consider the risks and benefits of taking the contraceptive pill. This kind of hormone treatment can increase your risk for blood clots.  Follow up with your health care provider as directed. SEEK MEDICAL CARE IF:   You have unusual bruising or any  bleeding problems.  Your swelling or pain in the affected area is not improving.  You are on anti-inflammatory medicine, and you develop belly (abdominal) pain. SEEK IMMEDIATE MEDICAL CARE IF:   You have a sudden onset of chest pain or difficulty breathing.  You have a fever or persistent symptoms for more than 2-3 days.  You have a fever and your symptoms suddenly get worse. MAKE SURE  YOU:  Understand these instructions.  Will watch your condition.  Will get help right away if you are not doing well or get worse. Document Released: 05/13/2001 Document Revised: 03/09/2013 Document Reviewed: 01/24/2013 [facility] Patient Information 2015 [place], [place]. This information is not intended to replace advice given to you by your health care provider. Make sure you discuss any questions you have with your health care provider.

## 2014-12-01 NOTE — ED Provider Notes (Signed)
CSN: [id number]     Arrival date & time 12/01/14  1524 History   First MD Initiated Contact with Patient 12/01/14 1541     No chief complaint on file.    The history is provided by the patient. No language interpreter was used.   Mr. Quillin presents for evaluation of left arm swelling. 2 weeks ago he was stung in the left hand by an insect. 3 days later he developed swelling of the hand and was seen in the ER. He received a dose of IV antibiotics in the left antecubital fossa and was sent home on oral antibiotics. The hand swelling improved but then a week ago he developed swelling and infection in the left antecubital fossa. He was treated with doxycycline for that. The swelling in the antecubital fossa has improved and he is still on his doxycycline course. He has 2 more days. Last night he developed pain, swelling, tenderness and a cord in his left forearm. Symptoms are moderate and constant. He denies any chest pain, shortness of breath, fevers. Symptoms are moderate, constant, worsening. He denies any history of blood clots.   Past Medical History  Diagnosis Date  . IBS (irritable bowel syndrome)   . Rosacea   . Depression   . ADD (attention deficit disorder)   . Esophageal reflux   . BPH (benign prostatic hypertrophy)   . Arthritis   . Cancer     basal skin   . OSA (obstructive sleep apnea)     central sleep apnea   Past Surgical History  Procedure Laterality Date  . Left inguinal hernia repair    . Skin cancer removed    . Multiple orthopedic surgery    . Mouth surgery      teeth removal/implants  . Jaw implants    . Cataract extraction    . Tonsillectomy    . Left clavical    . Right knee arthroscopy    . Planter fascia    . Left foot neuroma    . Right shoulder    . Right elbow    . Umbilical hernia repair  03/01/2013  . Total hip arthroplasty Right 05/11/2013    Procedure: RIGHT TOTAL HIP ARTHROPLASTY ANTERIOR APPROACH;  Surgeon: Gearlean Alf, MD;  Location: [facility]  ORS;  Service: Orthopedics;  Laterality: Right;  WOUND CLASSIFICATION-CLEAN  . Hernia repair       2001, 2014  . Total hip arthroplasty Left 09/18/2014    Procedure: LEFT TOTAL HIP ARTHROPLASTY ANTERIOR APPROACH;  Surgeon: Gaynelle Arabian, MD;  Location: [facility] ORS;  Service: Orthopedics;  Laterality: Left;   Family History  Problem Relation Age of Onset  . Heart attack Father 66  . Heart disease Father   . Lung disease Mother 48  . Diabetes Brother    History  Substance Use Topics  . Smoking status: Never Smoker   . Smokeless tobacco: Never Used  . Alcohol Use: Yes     Comment: occassionally    Review of Systems  All other systems reviewed and are negative.     Allergies  Valium  Home Medications   Prior to Admission medications   Medication Sig Start Date End Date Taking? Authorizing Provider  amphetamine-dextroamphetamine (ADDERALL XR) 30 MG 24 hr capsule Take 30 mg by mouth every morning.    Yes Historical Provider, MD  aspirin 81 MG tablet Take 81 mg by mouth daily.   Yes Historical Provider, MD  cholecalciferol (VITAMIN D) 1000 UNITS tablet Take  1,000 Units by mouth daily.   Yes Historical Provider, MD  doxycycline (DORYX) 100 MG DR capsule Take 100 mg by mouth 2 (two) times daily.   Yes Historical Provider, MD  doxycycline (VIBRAMYCIN) 50 MG/5ML SYRP Take by mouth 2 (two) times daily.   Yes Historical Provider, MD  finasteride (PROSCAR) 5 MG tablet Take 5 mg by mouth daily.   Yes Historical Provider, MD  fluticasone (FLONASE) 50 MCG/ACT nasal spray Place 2 sprays into both nostrils daily.   Yes Historical Provider, MD  omeprazole (PRILOSEC) 40 MG capsule Take 40 mg by mouth daily.   Yes Historical Provider, MD  simvastatin (ZOCOR) 10 MG tablet Take 10 mg by mouth every evening.    Yes Historical Provider, MD  Vilazodone HCl (VIIBRYD) 20 MG TABS Take 20 mg by mouth every morning.    Yes Historical Provider, MD   BP 131/77 mmHg  Pulse 77  Temp(Src) 98.2 F (36.8 C)  (Oral)  Resp 18  Ht 5\' 10"  (1.778 m)  Wt 175 lb (79.379 kg)  BMI 25.11 kg/m2  SpO2 100% Physical Exam  Constitutional: He is oriented to person, place, and time. He appears well-developed and well-nourished.  HENT:  Head: Normocephalic and atraumatic.  Cardiovascular: Normal rate.   Pulmonary/Chest: Effort normal. No respiratory distress.  Musculoskeletal:  2+ radial pulses bilaterally. There is a small amount of healing ecchymosis in the left AC. There is a palpable cord and erythema over the left mid forearm. There is no cellulitis or abscess in the left upper extremity. Normal range of motion in the left elbow and wrist.  Neurological: He is alert and oriented to person, place, and time.  Skin: Skin is warm and dry.  Psychiatric: He has a normal mood and affect. His behavior is normal.  Nursing note and vitals reviewed.   ED Course  Procedures (including critical care time) Labs Review Labs Reviewed - No data to display  Imaging Review US Venous Img Upper Uni Left  12/01/2014   CLINICAL DATA:  Left upper extremity pain and swelling.  EXAM: Left UPPER EXTREMITY VENOUS DOPPLER ULTRASOUND  TECHNIQUE: Gray-scale sonography with graded compression, as well as color Doppler and duplex ultrasound were performed to evaluate the upper extremity deep venous system from the level of the subclavian vein and including the jugular, axillary, basilic, radial, ulnar and upper cephalic vein. Spectral Doppler was utilized to evaluate flow at rest and with distal augmentation maneuvers.  COMPARISON:  None.  FINDINGS: Contralateral Subclavian Vein: Respiratory phasicity is normal and symmetric with the symptomatic side. No evidence of thrombus. Normal compressibility.  Internal Jugular Vein: No evidence of thrombus. Normal compressibility, respiratory phasicity and response to augmentation.  Subclavian Vein: No evidence of thrombus. Normal compressibility, respiratory phasicity and response to augmentation.   Axillary Vein: No evidence of thrombus. Normal compressibility, respiratory phasicity and response to augmentation.  Cephalic Vein: No evidence of thrombus. Normal compressibility, respiratory phasicity and response to augmentation.  Basilic Vein: Noncompressible due to occlusive thrombus. No flow is noted.  Brachial Veins: No evidence of thrombus. Normal compressibility, respiratory phasicity and response to augmentation.  Radial Veins: No evidence of thrombus. Normal compressibility, respiratory phasicity and response to augmentation.  Ulnar Veins: No evidence of thrombus. Normal compressibility, respiratory phasicity and response to augmentation.  Venous Reflux:  None visualized.  Other Findings:  None visualized.  IMPRESSION: Occlusive thrombosis of the left basilic vein is noted.   Electronically Signed   By: Marijo Conception, M.D.  On: 12/01/2014 17:29     EKG Interpretation None      MDM   Final diagnoses:  Acute basilic vein thrombosis, left    Patient here for evaluation of left upper extremity pain and tenderness. Patient has a superficial thrombophlebitis of the basilic vein. Discussed with patient home care with aspirin, NSAIDs, warm compresses, outpatient follow-up for recheck. His prior cellulitis appears to be improving, providing short additional amount of doxycycline and to ensure complete clearance of infection. Discussed outpatient follow-up and return precautions.  Quintella Reichert, MD 12/01/14 2010

## 2014-12-01 NOTE — ED Notes (Signed)
C/o bite from wasp and hand got infected was admitted to [facility] and had IV that he developed a staph infection. Now has redness to left arm and up vein. Started yest with pain and now is red. MD questions a clot in arm.

## 2015-01-17 ENCOUNTER — Telehealth: Payer: Self-pay | Admitting: *Deleted

## 2015-01-17 ENCOUNTER — Ambulatory Visit (AMBULATORY_SURGERY_CENTER): Payer: Self-pay | Admitting: *Deleted

## 2015-01-17 VITALS — Ht 70.0 in | Wt 178.0 lb

## 2015-01-17 DIAGNOSIS — Z1211 Encounter for screening for malignant neoplasm of colon: Secondary | ICD-10-CM

## 2015-01-17 MED ORDER — MOVIPREP 100 G PO SOLR: ORAL | Status: DC

## 2015-01-17 NOTE — Progress Notes (Signed)
Patient denies any allergies to eggs or soy. Patient denies any problems with anesthesia/sedation. Patient denies any oxygen use at home and does not take any diet/weight loss medications. Patient declined EMMI education information today. Last colon 10 years ago at [facility]. Release of information form filled out and signed by patient, this was given to [facility].

## 2015-01-17 NOTE — Telephone Encounter (Signed)
Patient is here for direct screening colonoscopy on 01/31/15 with Dr.Perry. Patient believes his last colonoscopy was 10 years ago with [facility]. He was told he had polyps but they were not removed?? Patient was told to repeat colonoscopy in 10 years. Release of information form signed and given to [facility].

## 2015-01-30 NOTE — Telephone Encounter (Signed)
Colon received - given to Dr. [name] for review

## 2015-01-31 ENCOUNTER — Encounter: Payer: Self-pay | Admitting: Internal Medicine

## 2015-01-31 ENCOUNTER — Ambulatory Visit (AMBULATORY_SURGERY_CENTER): Payer: Medicare Other | Admitting: Internal Medicine

## 2015-01-31 VITALS — BP 100/64 | HR 64 | Temp 97.4°F | Resp 14 | Ht 70.0 in | Wt 178.0 lb

## 2015-01-31 DIAGNOSIS — Z1211 Encounter for screening for malignant neoplasm of colon: Secondary | ICD-10-CM | POA: Diagnosis not present

## 2015-01-31 DIAGNOSIS — D123 Benign neoplasm of transverse colon: Secondary | ICD-10-CM

## 2015-01-31 DIAGNOSIS — K635 Polyp of colon: Secondary | ICD-10-CM

## 2015-01-31 MED ORDER — SODIUM CHLORIDE 0.9 % IV SOLN: 500.0000 mL | INTRAVENOUS | Status: DC

## 2015-01-31 NOTE — Progress Notes (Signed)
Transferred to recovery room. A/O x3, pleased with MAC.  VSS.  Report to Shelia, RN. 

## 2015-01-31 NOTE — Progress Notes (Signed)
Called to room to assist during endoscopic procedure.  Patient ID and intended procedure confirmed with present staff. Received instructions for my participation in the procedure from the performing physician.  

## 2015-01-31 NOTE — Op Note (Signed)
[facility]  [address] [place], [address]   COLONOSCOPY PROCEDURE REPORT  PATIENT: Tanner [name], Tanner [name]  MR#: [id number] BIRTHDATE: Jun 15, 1945 , 84  yrs. old GENDER: male ENDOSCOPIST: Eustace Quail, MD REFERRED BY:.Direct Self PROCEDURE DATE:  01/31/2015 PROCEDURE:   Colonoscopy, screening and Colonoscopy with snare polypectomy X2 First Screening Colonoscopy - Avg.  risk and is 50 yrs.  old or older - No.  Prior Negative Screening - Now for repeat screening. 10 or more years since last screening  History of Adenoma - Now for follow-up colonoscopy & has been > or = to 3 yrs.  N/A  Polyps removed today? Yes ASA CLASS:   Class II INDICATIONS:Screening for colonic neoplasia and Colorectal Neoplasm Risk Assessment for this procedure is average risk. . Index exam with [facility] February 2006. No report. Pathology with hyperplastic polyp MEDICATIONS: Monitored anesthesia care and Propofol 200 mg IV  DESCRIPTION OF PROCEDURE:   After the risks benefits and alternatives of the procedure were thoroughly explained, informed consent was obtained.  The digital rectal exam revealed no abnormalities of the rectum.   The LB [id number]  endoscope was introduced through the anus and advanced to the cecum, which was identified by both the appendix and ileocecal valve. No adverse events experienced.   The quality of the prep was excellent. (MoviPrep was used)  The instrument was then slowly withdrawn as the colon was fully examined. Estimated blood loss is zero unless otherwise noted in this procedure report.    COLON FINDINGS: Two polyps ranging between 3-54mm in size were found in the transverse colon.  A polypectomy was performed with a cold snare.  The resection was complete, the polyp tissue was completely retrieved and sent to histology.   The examination was otherwise normal.  Retroflexed views revealed internal hemorrhoids. The time to cecum = 1.8 Withdrawal  time = 10.1   The scope was withdrawn and the procedure completed. COMPLICATIONS: There were no immediate complications.  ENDOSCOPIC IMPRESSION: 1.   Two polyps were found in the transverse colon; polypectomy was performed with a cold snare 2.   The examination was otherwise normal  RECOMMENDATIONS: 1. Repeat colonoscopy in 5 years if polyp adenomatous; otherwise prn   eSigned:  Eustace Quail, MD 01/31/2015 1:56 PM   cc: The Patient    ; Lujean Amel, MD

## 2015-01-31 NOTE — Patient Instructions (Signed)
YOU HAD AN ENDOSCOPIC PROCEDURE TODAY AT THE [facility]:   Refer to the procedure report that was given to you for any specific questions about what was found during the examination.  If the procedure report does not answer your questions, please call your gastroenterologist to clarify.  If you requested that your care partner not be given the details of your procedure findings, then the procedure report has been included in a sealed envelope for you to review at your convenience later.  YOU SHOULD EXPECT: Some feelings of bloating in the abdomen. Passage of more gas than usual.  Walking can help get rid of the air that was put into your GI tract during the procedure and reduce the bloating. If you had a lower endoscopy (such as a colonoscopy or flexible sigmoidoscopy) you may notice spotting of blood in your stool or on the toilet paper. If you underwent a bowel prep for your procedure, you may not have a normal bowel movement for a few days.  Please Note:  You might notice some irritation and congestion in your nose or some drainage.  This is from the oxygen used during your procedure.  There is no need for concern and it should clear up in a day or so.  SYMPTOMS TO REPORT IMMEDIATELY:   Following lower endoscopy (colonoscopy or flexible sigmoidoscopy):  Excessive amounts of blood in the stool  Significant tenderness or worsening of abdominal pains  Swelling of the abdomen that is new, acute  Fever of 100F or higher   For urgent or emergent issues, a gastroenterologist can be reached at any hour by calling [phone].   DIET: Your first meal following the procedure should be a small meal and then it is ok to progress to your normal diet. Heavy or fried foods are harder to digest and may make you feel nauseous or bloated.  Likewise, meals heavy in dairy and vegetables can increase bloating.  Drink plenty of fluids but you should avoid alcoholic beverages for 24  hours.  ACTIVITY:  You should plan to take it easy for the rest of today and you should NOT DRIVE or use heavy machinery until tomorrow (because of the sedation medicines used during the test).    FOLLOW UP: Our staff will call the number listed on your records the next business day following your procedure to check on you and address any questions or concerns that you may have regarding the information given to you following your procedure. If we do not reach you, we will leave a message.  However, if you are feeling well and you are not experiencing any problems, there is no need to return our call.  We will assume that you have returned to your regular daily activities without incident.  If any biopsies were taken you will be contacted by phone or by letter within the next 1-3 weeks.  Please call us at [phone] if you have not heard about the biopsies in 3 weeks.    SIGNATURES/CONFIDENTIALITY: You and/or your care partner have signed paperwork which will be entered into your electronic medical record.  These signatures attest to the fact that that the information above on your After Visit Summary has been reviewed and is understood.  Full responsibility of the confidentiality of this discharge information lies with you and/or your care-partner.    Resume medications. Information given on polyps. 

## 2015-02-01 ENCOUNTER — Telehealth: Payer: Self-pay | Admitting: *Deleted

## 2015-02-01 NOTE — Telephone Encounter (Signed)
  Follow up Call-  Call back number 01/31/2015  Post procedure Call Back phone  # [phone]  Permission to leave phone message Yes     Patient questions:  Do you have a fever, pain , or abdominal swelling? No. Pain Score  0 *  Have you tolerated food without any problems? Yes.    Have you been able to return to your normal activities? Yes.    Do you have any questions about your discharge instructions: Diet   No. Medications  No. Follow up visit  No.  Do you have questions or concerns about your Care? No.  Actions: * If pain score is 4 or above: No action needed, pain <4.

## 2015-02-07 ENCOUNTER — Other Ambulatory Visit: Payer: Self-pay | Admitting: Internal Medicine

## 2015-02-07 DIAGNOSIS — D123 Benign neoplasm of transverse colon: Secondary | ICD-10-CM | POA: Diagnosis not present

## 2015-02-09 ENCOUNTER — Encounter: Payer: Self-pay | Admitting: Internal Medicine

## 2015-10-02 ENCOUNTER — Other Ambulatory Visit: Payer: Medicare Other

## 2015-10-02 ENCOUNTER — Other Ambulatory Visit: Payer: Self-pay | Admitting: Family Medicine

## 2015-10-02 DIAGNOSIS — Z86718 Personal history of other venous thrombosis and embolism: Secondary | ICD-10-CM

## 2015-10-02 DIAGNOSIS — R58 Hemorrhage, not elsewhere classified: Secondary | ICD-10-CM

## 2015-10-05 ENCOUNTER — Ambulatory Visit
Admission: RE | Admit: 2015-10-05 | Discharge: 2015-10-05 | Disposition: A | Discharge status: Home / Self Care | Payer: Medicare Other | Source: Ambulatory Visit | Attending: Family Medicine | Admitting: Family Medicine

## 2015-10-05 DIAGNOSIS — Z86718 Personal history of other venous thrombosis and embolism: Secondary | ICD-10-CM

## 2015-10-05 DIAGNOSIS — R58 Hemorrhage, not elsewhere classified: Secondary | ICD-10-CM

## 2016-04-10 ENCOUNTER — Other Ambulatory Visit: Payer: Self-pay | Admitting: Physician Assistant

## 2016-04-10 ENCOUNTER — Ambulatory Visit
Admission: RE | Admit: 2016-04-10 | Discharge: 2016-04-10 | Disposition: A | Discharge status: Home / Self Care | Payer: Medicare Other | Source: Ambulatory Visit | Attending: Physician Assistant | Admitting: Physician Assistant

## 2016-04-10 DIAGNOSIS — R059 Cough, unspecified: Secondary | ICD-10-CM

## 2016-04-10 DIAGNOSIS — R05 Cough: Secondary | ICD-10-CM

## 2016-04-18 ENCOUNTER — Encounter (HOSPITAL_COMMUNITY): Payer: Self-pay

## 2016-04-18 ENCOUNTER — Encounter (HOSPITAL_COMMUNITY)
Admission: RE | Admit: 2016-04-18 | Discharge: 2016-04-18 | Disposition: A | Discharge status: Home / Self Care | Payer: Medicare Other | Source: Ambulatory Visit | Attending: Orthopedic Surgery | Admitting: Orthopedic Surgery

## 2016-04-18 DIAGNOSIS — Z85828 Personal history of other malignant neoplasm of skin: Secondary | ICD-10-CM | POA: Insufficient documentation

## 2016-04-18 DIAGNOSIS — F419 Anxiety disorder, unspecified: Secondary | ICD-10-CM | POA: Diagnosis not present

## 2016-04-18 DIAGNOSIS — M87059 Idiopathic aseptic necrosis of unspecified femur: Secondary | ICD-10-CM | POA: Insufficient documentation

## 2016-04-18 DIAGNOSIS — F329 Major depressive disorder, single episode, unspecified: Secondary | ICD-10-CM | POA: Diagnosis not present

## 2016-04-18 DIAGNOSIS — K219 Gastro-esophageal reflux disease without esophagitis: Secondary | ICD-10-CM | POA: Diagnosis not present

## 2016-04-18 DIAGNOSIS — N400 Enlarged prostate without lower urinary tract symptoms: Secondary | ICD-10-CM | POA: Diagnosis not present

## 2016-04-18 DIAGNOSIS — I82612 Acute embolism and thrombosis of superficial veins of left upper extremity: Secondary | ICD-10-CM | POA: Diagnosis not present

## 2016-04-18 DIAGNOSIS — G4733 Obstructive sleep apnea (adult) (pediatric): Secondary | ICD-10-CM | POA: Diagnosis not present

## 2016-04-18 DIAGNOSIS — F988 Other specified behavioral and emotional disorders with onset usually occurring in childhood and adolescence: Secondary | ICD-10-CM | POA: Diagnosis not present

## 2016-04-18 DIAGNOSIS — M160 Bilateral primary osteoarthritis of hip: Secondary | ICD-10-CM | POA: Diagnosis not present

## 2016-04-18 DIAGNOSIS — Z01812 Encounter for preprocedural laboratory examination: Secondary | ICD-10-CM | POA: Diagnosis not present

## 2016-04-18 LAB — BASIC METABOLIC PANEL
ANION GAP: 7 (ref 5–15)
BUN: 16 mg/dL (ref 6–20)
CALCIUM: 9.5 mg/dL (ref 8.9–10.3)
CO2: 26 mmol/L (ref 22–32)
CREATININE: 1.38 mg/dL — AB (ref 0.61–1.24)
Chloride: 108 mmol/L (ref 101–111)
GFR calc Af Amer: 58 mL/min — ABNORMAL LOW (ref >60)
GFR, EST NON AFRICAN AMERICAN: 50 mL/min — AB (ref >60)
GLUCOSE: 93 mg/dL (ref 65–99)
Potassium: 4.8 mmol/L (ref 3.5–5.1)
Sodium: 141 mmol/L (ref 135–145)

## 2016-04-18 LAB — CBC
HCT: 45.4 % (ref 39.0–52.0)
Hemoglobin: 15.2 g/dL (ref 13.0–17.0)
MCH: 32.3 pg (ref 26.0–34.0)
MCHC: 33.5 g/dL (ref 30.0–36.0)
MCV: 96.6 fL (ref 78.0–100.0)
PLATELETS: 204 10*3/uL (ref 150–400)
RBC: 4.7 MIL/uL (ref 4.22–5.81)
RDW: 13.2 % (ref 11.5–15.5)
WBC: 6.9 10*3/uL (ref 4.0–10.5)

## 2016-04-18 LAB — SURGICAL PCR SCREEN
MRSA, PCR: NEGATIVE
STAPHYLOCOCCUS AUREUS: NEGATIVE

## 2016-04-18 NOTE — Pre-Procedure Instructions (Signed)
Tanner [name]  04/18/2016      [id number] [address], [address] [place] [address] Phone: [phone] Fax: [phone]    Your procedure is scheduled on Thursday, May 01, 2016  Report to [facility] Admitting at 10:30 A.M.  Call this number if you have problems the morning of surgery:  [phone]   Remember:  Do not eat food or drink liquids after midnight Wednesday, April 30, 2016  Take these medicines the morning of surgery with A SIP OF WATER : finasteride (PROSCAR), omeprazole (PRILOSEC), Vilazodone HCl (VIIBRYD), fluticasone (FLONASE)  nasal spray, if needed: eye drops  Stop taking Aspirin, vitamins, fish oil and herbal medications such as Biotin, Probiotic, and Glucosamine Chondroit. Do not take any NSAIDs ie: Ibuprofen, Advil, Naproxen, BC and Goody Powder or any medication containing Aspirin; stop Thursday, April 24, 2016.  Do not wear jewelry, make-up or nail polish.  Do not wear lotions, powders, or perfumes, or deoderant.  Do not shave 48 hours prior to surgery.  Men may shave face and neck.  Do not bring valuables to the hospital.  [facility] is not responsible for any belongings or valuables.  Contacts, dentures or bridgework may not be worn into surgery.  Leave your suitcase in the car.  After surgery it may be brought to your room.  For patients admitted to the hospital, discharge time will be determined by your treatment team.  Patients discharged the day of surgery will not be allowed to drive home.   Special instructions: [facility] - Preparing for Surgery  Before surgery, you can play an important role.  Because skin is not sterile, your skin needs to be as free of germs as possible.  You can reduce the number of germs on you skin by washing with CHG (chlorahexidine gluconate) soap before surgery.  CHG is an antiseptic cleaner which kills germs and bonds with the skin to continue killing germs  even after washing.  Please DO NOT use if you have an allergy to CHG or antibacterial soaps.  If your skin becomes reddened/irritated stop using the CHG and inform your nurse when you arrive at Short Stay.  Do not shave (including legs and underarms) for at least 48 hours prior to the first CHG shower.  You may shave your face.  Please follow these instructions carefully:   1.  Shower with CHG Soap the night before surgery and the morning of Surgery.  2.  If you choose to wash your hair, wash your hair first as usual with your normal shampoo.  3.  After you shampoo, rinse your hair and body thoroughly to remove the Shampoo.  4.  Use CHG as you would any other liquid soap.  You can apply chg directly  to the skin and wash gently with scrungie or a clean washcloth.  5.  Apply the CHG Soap to your body ONLY FROM THE NECK DOWN.  Do not use on open wounds or open sores.  Avoid contact with your eyes, ears, mouth and genitals (private parts).  Wash genitals (private parts) with your normal soap.  6.  Wash thoroughly, paying special attention to the area where your surgery will be performed.  7.  Thoroughly rinse your body with warm water from the neck down.  8.  DO NOT shower/wash with your normal soap after using and rinsing off the CHG Soap.  9.  Pat yourself dry with a clean towel.  10.  Wear clean pajamas.            11.  Place clean sheets on your bed the night of your first shower and do not sleep with pets.  Day of Surgery  Do not apply any lotions/deoderants the morning of surgery.  Please wear clean clothes to the hospital/surgery center.  Please read over the following fact sheets that you were given. Pain Booklet, Coughing and Deep Breathing, MRSA Information and Surgical Site Infection Prevention

## 2016-04-18 NOTE — Progress Notes (Signed)
Pt denies SOB, chest pain, and being under the care of a cardiologist. Pt denies having a stress test, echo and cardiac cath. Pt denies having an EKG within the last year. 

## 2016-04-30 MED ORDER — TRANEXAMIC ACID 1000 MG/10ML IV SOLN
1000.0000 mg | INTRAVENOUS | Status: AC
  Administered 2016-05-01: 1000 mg via INTRAVENOUS
  Filled 2016-04-30: qty 10

## 2016-05-01 ENCOUNTER — Inpatient Hospital Stay (HOSPITAL_COMMUNITY)
Admission: RE | Admit: 2016-05-01 | Discharge: 2016-05-02 | DRG: 483 | Disposition: A | Discharge status: Home / Self Care | Payer: Medicare Other | Source: Ambulatory Visit | Attending: Orthopedic Surgery | Admitting: Orthopedic Surgery

## 2016-05-01 ENCOUNTER — Encounter (HOSPITAL_COMMUNITY)
Admission: RE | Disposition: A | Discharge status: Home / Self Care | Payer: Self-pay | Source: Ambulatory Visit | Attending: Orthopedic Surgery

## 2016-05-01 ENCOUNTER — Inpatient Hospital Stay (HOSPITAL_COMMUNITY): Payer: Medicare Other | Admitting: Certified Registered Nurse Anesthetist

## 2016-05-01 ENCOUNTER — Encounter (HOSPITAL_COMMUNITY): Payer: Self-pay | Admitting: *Deleted

## 2016-05-01 DIAGNOSIS — Z8249 Family history of ischemic heart disease and other diseases of the circulatory system: Secondary | ICD-10-CM | POA: Diagnosis not present

## 2016-05-01 DIAGNOSIS — K219 Gastro-esophageal reflux disease without esophagitis: Secondary | ICD-10-CM | POA: Diagnosis present

## 2016-05-01 DIAGNOSIS — Z833 Family history of diabetes mellitus: Secondary | ICD-10-CM | POA: Diagnosis not present

## 2016-05-01 DIAGNOSIS — Z79899 Other long term (current) drug therapy: Secondary | ICD-10-CM

## 2016-05-01 DIAGNOSIS — N400 Enlarged prostate without lower urinary tract symptoms: Secondary | ICD-10-CM | POA: Diagnosis present

## 2016-05-01 DIAGNOSIS — Z7982 Long term (current) use of aspirin: Secondary | ICD-10-CM | POA: Diagnosis not present

## 2016-05-01 DIAGNOSIS — M19012 Primary osteoarthritis, left shoulder: Secondary | ICD-10-CM | POA: Diagnosis present

## 2016-05-01 DIAGNOSIS — G4733 Obstructive sleep apnea (adult) (pediatric): Secondary | ICD-10-CM | POA: Diagnosis present

## 2016-05-01 DIAGNOSIS — Z791 Long term (current) use of non-steroidal anti-inflammatories (NSAID): Secondary | ICD-10-CM | POA: Diagnosis not present

## 2016-05-01 DIAGNOSIS — F329 Major depressive disorder, single episode, unspecified: Secondary | ICD-10-CM | POA: Diagnosis present

## 2016-05-01 DIAGNOSIS — Z96643 Presence of artificial hip joint, bilateral: Secondary | ICD-10-CM | POA: Diagnosis present

## 2016-05-01 DIAGNOSIS — E7800 Pure hypercholesterolemia, unspecified: Secondary | ICD-10-CM | POA: Diagnosis present

## 2016-05-01 DIAGNOSIS — M25512 Pain in left shoulder: Secondary | ICD-10-CM | POA: Diagnosis present

## 2016-05-01 DIAGNOSIS — Z9849 Cataract extraction status, unspecified eye: Secondary | ICD-10-CM | POA: Diagnosis not present

## 2016-05-01 DIAGNOSIS — Z800 Family history of malignant neoplasm of digestive organs: Secondary | ICD-10-CM | POA: Diagnosis not present

## 2016-05-01 DIAGNOSIS — I739 Peripheral vascular disease, unspecified: Secondary | ICD-10-CM | POA: Diagnosis present

## 2016-05-01 DIAGNOSIS — Z85828 Personal history of other malignant neoplasm of skin: Secondary | ICD-10-CM

## 2016-05-01 DIAGNOSIS — Z96612 Presence of left artificial shoulder joint: Secondary | ICD-10-CM

## 2016-05-01 DIAGNOSIS — F419 Anxiety disorder, unspecified: Secondary | ICD-10-CM | POA: Diagnosis present

## 2016-05-01 HISTORY — DX: Basal cell carcinoma of skin, unspecified: C44.91

## 2016-05-01 HISTORY — DX: Pure hypercholesterolemia, unspecified: E78.00

## 2016-05-01 HISTORY — PX: TOTAL SHOULDER ARTHROPLASTY: SHX126

## 2016-05-01 SURGERY — ARTHROPLASTY, SHOULDER, TOTAL
Anesthesia: Regional | Laterality: Left

## 2016-05-01 MED ORDER — LIDOCAINE 2% (20 MG/ML) 5 ML SYRINGE
INTRAMUSCULAR | Status: AC
  Filled 2016-05-01: qty 5

## 2016-05-01 MED ORDER — SUCCINYLCHOLINE CHLORIDE 20 MG/ML IJ SOLN
INTRAMUSCULAR | Status: DC | PRN
  Administered 2016-05-01: 110 mg via INTRAVENOUS

## 2016-05-01 MED ORDER — ASPIRIN EC 81 MG PO TBEC
81.0000 mg | DELAYED_RELEASE_TABLET | Freq: Every day | ORAL | Status: DC
  Administered 2016-05-01: 81 mg via ORAL
  Filled 2016-05-01: qty 1

## 2016-05-01 MED ORDER — ONDANSETRON HCL 4 MG/2ML IJ SOLN
INTRAMUSCULAR | Status: AC
  Filled 2016-05-01: qty 2

## 2016-05-01 MED ORDER — MIDAZOLAM HCL 2 MG/2ML IJ SOLN
INTRAMUSCULAR | Status: AC
  Filled 2016-05-01: qty 2

## 2016-05-01 MED ORDER — DEXAMETHASONE SODIUM PHOSPHATE 10 MG/ML IJ SOLN
INTRAMUSCULAR | Status: AC
  Filled 2016-05-01: qty 1

## 2016-05-01 MED ORDER — LIDOCAINE HCL (CARDIAC) 20 MG/ML IV SOLN
INTRAVENOUS | Status: DC | PRN
  Administered 2016-05-01: 80 mg via INTRATRACHEAL

## 2016-05-01 MED ORDER — FINASTERIDE 5 MG PO TABS
5.0000 mg | ORAL_TABLET | Freq: Every day | ORAL | Status: DC
  Administered 2016-05-02: 5 mg via ORAL
  Filled 2016-05-01: qty 1

## 2016-05-01 MED ORDER — MIDAZOLAM HCL 2 MG/2ML IJ SOLN
2.0000 mg | Freq: Once | INTRAMUSCULAR | Status: AC
  Administered 2016-05-01: 2 mg via INTRAVENOUS

## 2016-05-01 MED ORDER — DOXYCYCLINE HYCLATE 100 MG PO TABS
100.0000 mg | ORAL_TABLET | Freq: Every day | ORAL | Status: DC
  Administered 2016-05-02: 100 mg via ORAL
  Filled 2016-05-01: qty 1

## 2016-05-01 MED ORDER — METOCLOPRAMIDE HCL 5 MG/ML IJ SOLN: 5.0000 mg | Freq: Three times a day (TID) | INTRAMUSCULAR | Status: DC | PRN

## 2016-05-01 MED ORDER — VILAZODONE HCL 40 MG PO TABS
20.0000 mg | ORAL_TABLET | Freq: Every morning | ORAL | Status: DC
  Administered 2016-05-02: 20 mg via ORAL
  Filled 2016-05-01: qty 0.5

## 2016-05-01 MED ORDER — GLYCOPYRROLATE 0.2 MG/ML IV SOSY
PREFILLED_SYRINGE | INTRAVENOUS | Status: AC
  Filled 2016-05-01: qty 3

## 2016-05-01 MED ORDER — LACTATED RINGERS IV SOLN: INTRAVENOUS | Status: DC

## 2016-05-01 MED ORDER — PROMETHAZINE HCL 25 MG/ML IJ SOLN: 6.2500 mg | INTRAMUSCULAR | Status: DC | PRN

## 2016-05-01 MED ORDER — ARTIFICIAL TEARS OP OINT
TOPICAL_OINTMENT | OPHTHALMIC | Status: AC
  Filled 2016-05-01: qty 3.5

## 2016-05-01 MED ORDER — OXYCODONE HCL 5 MG PO TABS
5.0000 mg | ORAL_TABLET | ORAL | Status: DC | PRN
  Administered 2016-05-01: 10 mg via ORAL
  Filled 2016-05-01: qty 2

## 2016-05-01 MED ORDER — POLYETHYLENE GLYCOL 3350 17 G PO PACK: 17.0000 g | PACK | Freq: Every day | ORAL | Status: DC | PRN

## 2016-05-01 MED ORDER — PROPOFOL 10 MG/ML IV BOLUS
INTRAVENOUS | Status: AC
Start: 2016-05-01 — End: 2016-05-01
  Filled 2016-05-01: qty 20

## 2016-05-01 MED ORDER — EPHEDRINE SULFATE 50 MG/ML IJ SOLN
INTRAMUSCULAR | Status: DC | PRN
  Administered 2016-05-01: 10 mg via INTRAVENOUS
  Administered 2016-05-01 (×2): 15 mg via INTRAVENOUS

## 2016-05-01 MED ORDER — FENTANYL CITRATE (PF) 100 MCG/2ML IJ SOLN
INTRAMUSCULAR | Status: AC
  Filled 2016-05-01: qty 2

## 2016-05-01 MED ORDER — CEFAZOLIN SODIUM-DEXTROSE 2-4 GM/100ML-% IV SOLN
2.0000 g | Freq: Four times a day (QID) | INTRAVENOUS | Status: AC
  Administered 2016-05-01 – 2016-05-02 (×3): 2 g via INTRAVENOUS
  Filled 2016-05-01 (×3): qty 100

## 2016-05-01 MED ORDER — SIMVASTATIN 20 MG PO TABS
20.0000 mg | ORAL_TABLET | Freq: Every evening | ORAL | Status: DC
  Administered 2016-05-01: 20 mg via ORAL
  Filled 2016-05-01: qty 1

## 2016-05-01 MED ORDER — PROPOFOL 10 MG/ML IV BOLUS
INTRAVENOUS | Status: DC | PRN
  Administered 2016-05-01: 150 mg via INTRAVENOUS
  Administered 2016-05-01: 50 mg via INTRAVENOUS

## 2016-05-01 MED ORDER — PHENOL 1.4 % MT LIQD: 1.0000 | OROMUCOSAL | Status: DC | PRN

## 2016-05-01 MED ORDER — MENTHOL 3 MG MT LOZG: 1.0000 | LOZENGE | OROMUCOSAL | Status: DC | PRN

## 2016-05-01 MED ORDER — ALUM & MAG HYDROXIDE-SIMETH 200-200-20 MG/5ML PO SUSP: 30.0000 mL | ORAL | Status: DC | PRN

## 2016-05-01 MED ORDER — PHENYLEPHRINE HCL 10 MG/ML IJ SOLN
INTRAVENOUS | Status: DC | PRN
  Administered 2016-05-01: 50 ug/min via INTRAVENOUS

## 2016-05-01 MED ORDER — 0.9 % SODIUM CHLORIDE (POUR BTL) OPTIME
TOPICAL | Status: DC | PRN
  Administered 2016-05-01: 1000 mL

## 2016-05-01 MED ORDER — ONDANSETRON HCL 4 MG/2ML IJ SOLN
INTRAMUSCULAR | Status: DC | PRN
  Administered 2016-05-01: 4 mg via INTRAVENOUS

## 2016-05-01 MED ORDER — HYDROMORPHONE HCL 2 MG/ML IJ SOLN: 1.0000 mg | INTRAMUSCULAR | Status: DC | PRN

## 2016-05-01 MED ORDER — METHOCARBAMOL 1000 MG/10ML IJ SOLN
500.0000 mg | Freq: Four times a day (QID) | INTRAVENOUS | Status: DC | PRN
  Filled 2016-05-01: qty 5

## 2016-05-01 MED ORDER — CHLORHEXIDINE GLUCONATE 4 % EX LIQD: 60.0000 mL | Freq: Once | CUTANEOUS | Status: DC

## 2016-05-01 MED ORDER — CEFAZOLIN SODIUM-DEXTROSE 2-4 GM/100ML-% IV SOLN
INTRAVENOUS | Status: AC
  Filled 2016-05-01: qty 100

## 2016-05-01 MED ORDER — SUCCINYLCHOLINE CHLORIDE 200 MG/10ML IV SOSY
PREFILLED_SYRINGE | INTRAVENOUS | Status: AC
  Filled 2016-05-01: qty 10

## 2016-05-01 MED ORDER — BUPIVACAINE-EPINEPHRINE (PF) 0.5% -1:200000 IJ SOLN
INTRAMUSCULAR | Status: DC | PRN
  Administered 2016-05-01: 30 mL via PERINEURAL

## 2016-05-01 MED ORDER — EPHEDRINE 5 MG/ML INJ
INTRAVENOUS | Status: AC
  Filled 2016-05-01: qty 10

## 2016-05-01 MED ORDER — KETOROLAC TROMETHAMINE 15 MG/ML IJ SOLN
7.5000 mg | Freq: Four times a day (QID) | INTRAMUSCULAR | Status: DC
  Administered 2016-05-02 (×2): 7.5 mg via INTRAVENOUS
  Filled 2016-05-01 (×2): qty 1

## 2016-05-01 MED ORDER — HYDROMORPHONE HCL 1 MG/ML IJ SOLN: 0.2500 mg | INTRAMUSCULAR | Status: DC | PRN

## 2016-05-01 MED ORDER — CEFAZOLIN SODIUM-DEXTROSE 2-4 GM/100ML-% IV SOLN
2.0000 g | INTRAVENOUS | Status: AC
  Administered 2016-05-01: 2 g via INTRAVENOUS

## 2016-05-01 MED ORDER — DOCUSATE SODIUM 100 MG PO CAPS
100.0000 mg | ORAL_CAPSULE | Freq: Two times a day (BID) | ORAL | Status: DC
  Administered 2016-05-01 – 2016-05-02 (×2): 100 mg via ORAL
  Filled 2016-05-01 (×2): qty 1

## 2016-05-01 MED ORDER — FENTANYL CITRATE (PF) 100 MCG/2ML IJ SOLN
100.0000 ug | Freq: Once | INTRAMUSCULAR | Status: AC
  Administered 2016-05-01: 100 ug via INTRAVENOUS

## 2016-05-01 MED ORDER — BISACODYL 5 MG PO TBEC: 5.0000 mg | DELAYED_RELEASE_TABLET | Freq: Every day | ORAL | Status: DC | PRN

## 2016-05-01 MED ORDER — METOCLOPRAMIDE HCL 5 MG PO TABS: 5.0000 mg | ORAL_TABLET | Freq: Three times a day (TID) | ORAL | Status: DC | PRN

## 2016-05-01 MED ORDER — ONDANSETRON HCL 4 MG/2ML IJ SOLN: 4.0000 mg | Freq: Four times a day (QID) | INTRAMUSCULAR | Status: DC | PRN

## 2016-05-01 MED ORDER — FLEET ENEMA 7-19 GM/118ML RE ENEM: 1.0000 | ENEMA | Freq: Once | RECTAL | Status: DC | PRN

## 2016-05-01 MED ORDER — DIPHENHYDRAMINE HCL 12.5 MG/5ML PO ELIX: 12.5000 mg | ORAL_SOLUTION | ORAL | Status: DC | PRN

## 2016-05-01 MED ORDER — PANTOPRAZOLE SODIUM 40 MG PO TBEC
40.0000 mg | DELAYED_RELEASE_TABLET | Freq: Every day | ORAL | Status: DC
  Administered 2016-05-02: 40 mg via ORAL
  Filled 2016-05-01: qty 1

## 2016-05-01 MED ORDER — METHOCARBAMOL 500 MG PO TABS
500.0000 mg | ORAL_TABLET | Freq: Four times a day (QID) | ORAL | Status: DC | PRN
  Administered 2016-05-01: 500 mg via ORAL
  Filled 2016-05-01: qty 1

## 2016-05-01 MED ORDER — LACTATED RINGERS IV SOLN
INTRAVENOUS | Status: DC
  Administered 2016-05-01 (×3): via INTRAVENOUS

## 2016-05-01 MED ORDER — DEXAMETHASONE SODIUM PHOSPHATE 10 MG/ML IJ SOLN
INTRAMUSCULAR | Status: DC | PRN
  Administered 2016-05-01: 10 mg via INTRAVENOUS

## 2016-05-01 MED ORDER — MIDAZOLAM HCL 2 MG/2ML IJ SOLN
INTRAMUSCULAR | Status: DC | PRN
  Administered 2016-05-01 (×2): 1 mg via INTRAVENOUS

## 2016-05-01 MED ORDER — ONDANSETRON HCL 4 MG PO TABS: 4.0000 mg | ORAL_TABLET | Freq: Four times a day (QID) | ORAL | Status: DC | PRN

## 2016-05-01 MED ORDER — FENTANYL CITRATE (PF) 100 MCG/2ML IJ SOLN
INTRAMUSCULAR | Status: DC | PRN
  Administered 2016-05-01: 100 ug via INTRAVENOUS

## 2016-05-01 MED ORDER — GLYCOPYRROLATE 0.2 MG/ML IJ SOLN
INTRAMUSCULAR | Status: DC | PRN
  Administered 2016-05-01 (×2): 0.1 mg via INTRAVENOUS
  Administered 2016-05-01: 0.2 mg via INTRAVENOUS

## 2016-05-01 MED ORDER — ACETAMINOPHEN 650 MG RE SUPP: 650.0000 mg | Freq: Four times a day (QID) | RECTAL | Status: DC | PRN

## 2016-05-01 MED ORDER — ACETAMINOPHEN 325 MG PO TABS: 650.0000 mg | ORAL_TABLET | Freq: Four times a day (QID) | ORAL | Status: DC | PRN

## 2016-05-01 SURGICAL SUPPLY — 61 items
BLADE SAW SGTL 83.5X18.5 (BLADE) ×2 IMPLANT
CEMENT BONE DEPUY (Cement) ×2 IMPLANT
COVER SURGICAL LIGHT HANDLE (MISCELLANEOUS) ×2 IMPLANT
DERMABOND ADHESIVE PROPEN (GAUZE/BANDAGES/DRESSINGS) ×1
DERMABOND ADVANCED (GAUZE/BANDAGES/DRESSINGS) ×1
DERMABOND ADVANCED .7 DNX12 (GAUZE/BANDAGES/DRESSINGS) ×1 IMPLANT
DERMABOND ADVANCED .7 DNX6 (GAUZE/BANDAGES/DRESSINGS) ×1 IMPLANT
DRAPE ORTHO SPLIT 77X108 STRL (DRAPES) ×2
DRAPE SURG 17X11 SM STRL (DRAPES) ×2 IMPLANT
DRAPE SURG ORHT 6 SPLT 77X108 (DRAPES) ×2 IMPLANT
DRAPE U-SHAPE 47X51 STRL (DRAPES) ×2 IMPLANT
DRSG AQUACEL AG ADV 3.5X10 (GAUZE/BANDAGES/DRESSINGS) ×2 IMPLANT
DURAPREP 26ML APPLICATOR (WOUND CARE) ×2 IMPLANT
ELECT BLADE 4.0 EZ CLEAN MEGAD (MISCELLANEOUS) ×2
ELECT CAUTERY BLADE 6.4 (BLADE) ×2 IMPLANT
ELECT REM PT RETURN 9FT ADLT (ELECTROSURGICAL) ×2
ELECTRODE BLDE 4.0 EZ CLN MEGD (MISCELLANEOUS) ×1 IMPLANT
ELECTRODE REM PT RTRN 9FT ADLT (ELECTROSURGICAL) ×1 IMPLANT
FACESHIELD WRAPAROUND (MASK) ×4 IMPLANT
GLENOID WITH CLEAT MEDIUM (Shoulder) ×2 IMPLANT
GLOVE BIO SURGEON STRL SZ7.5 (GLOVE) ×2 IMPLANT
GLOVE BIO SURGEON STRL SZ8 (GLOVE) ×2 IMPLANT
GLOVE EUDERMIC 7 POWDERFREE (GLOVE) ×2 IMPLANT
GLOVE SS BIOGEL STRL SZ 7.5 (GLOVE) ×1 IMPLANT
GLOVE SUPERSENSE BIOGEL SZ 7.5 (GLOVE) ×1
GOWN STRL REUS W/ TWL LRG LVL3 (GOWN DISPOSABLE) ×1 IMPLANT
GOWN STRL REUS W/ TWL XL LVL3 (GOWN DISPOSABLE) ×2 IMPLANT
GOWN STRL REUS W/TWL LRG LVL3 (GOWN DISPOSABLE) ×1
GOWN STRL REUS W/TWL XL LVL3 (GOWN DISPOSABLE) ×2
HEAD HUMERAL UNIVERS II 50/19 (Head) ×2 IMPLANT
KIT BASIN OR (CUSTOM PROCEDURE TRAY) ×2 IMPLANT
KIT ROOM TURNOVER OR (KITS) ×2 IMPLANT
KIT SET UNIVERSAL (KITS) ×2 IMPLANT
MANIFOLD NEPTUNE II (INSTRUMENTS) ×2 IMPLANT
NDL SUT .5 MAYO 1.404X.05X (NEEDLE) ×1 IMPLANT
NDL SUT 6 .5 CRC .975X.05 MAYO (NEEDLE) ×1 IMPLANT
NEEDLE MAYO TAPER (NEEDLE) ×2
NS IRRIG 1000ML POUR BTL (IV SOLUTION) ×2 IMPLANT
PACK SHOULDER (CUSTOM PROCEDURE TRAY) ×2 IMPLANT
PAD ARMBOARD 7.5X6 YLW CONV (MISCELLANEOUS) ×4 IMPLANT
RESTRAINT HEAD UNIVERSAL NS (MISCELLANEOUS) ×2 IMPLANT
SLING ARM FOAM STRAP LRG (SOFTGOODS) IMPLANT
SLING ARM FOAM STRAP XLG (SOFTGOODS) ×2 IMPLANT
SLING ARM XL FOAM STRAP (SOFTGOODS) ×2 IMPLANT
SMARTMIX MINI TOWER (MISCELLANEOUS) ×2
SPONGE LAP 18X18 X RAY DECT (DISPOSABLE) ×2 IMPLANT
SPONGE LAP 4X18 X RAY DECT (DISPOSABLE) ×2 IMPLANT
STEM HUMERAL UNI APEX 10MM (Shoulder) ×2 IMPLANT
SUCTION FRAZIER HANDLE 10FR (MISCELLANEOUS) ×1
SUCTION TUBE FRAZIER 10FR DISP (MISCELLANEOUS) ×1 IMPLANT
SUT FIBERWIRE #2 38 T-5 BLUE (SUTURE) ×8
SUT MNCRL AB 3-0 PS2 18 (SUTURE) ×2 IMPLANT
SUT MON AB 2-0 CT1 36 (SUTURE) ×2 IMPLANT
SUT VIC AB 1 CT1 27 (SUTURE) ×2
SUT VIC AB 1 CT1 27XBRD ANBCTR (SUTURE) ×1 IMPLANT
SUTURE FIBERWR #2 38 T-5 BLUE (SUTURE) ×4 IMPLANT
SYR CONTROL 10ML LL (SYRINGE) IMPLANT
TOWEL OR 17X24 6PK STRL BLUE (TOWEL DISPOSABLE) ×2 IMPLANT
TOWEL OR 17X26 10 PK STRL BLUE (TOWEL DISPOSABLE) ×2 IMPLANT
TOWER SMARTMIX MINI (MISCELLANEOUS) ×1 IMPLANT
WATER STERILE IRR 1000ML POUR (IV SOLUTION) IMPLANT

## 2016-05-01 NOTE — H&P (Signed)
Tanner Tanner [name]    Chief Complaint: LEFT SHOULDER OA HPI: The patient is a 70 y.o. male with end stage left shoulder OA  Past Medical History:  Diagnosis Date  . ADD (attention deficit disorder)   . Allergy   . Anxiety   . Arm thromboembolism, superficial, acute    from IV site, left arm  . Arthritis   . BPH (benign prostatic hypertrophy)   . Cancer ([place])    basal skin   . Cataract   . Depression   . Esophageal reflux   . IBS (irritable bowel syndrome)   . OSA (obstructive sleep apnea)    central sleep apnea  . Rosacea   . Sleep apnea     Past Surgical History:  Procedure Laterality Date  . CATARACT EXTRACTION    . COLONOSCOPY  10 years ago   pt does not know where/when  . HERNIA REPAIR      2001, 2014  . jaw implants    . left clavical    . left foot neuroma    . left inguinal hernia repair    . MOUTH SURGERY     teeth removal/implants  . multiple orthopedic surgery    . planter fascia    . right elbow    . right knee arthroscopy    . right shoulder    . skin cancer removed    . TONSILLECTOMY    . TOTAL HIP ARTHROPLASTY Right 05/11/2013   Procedure: RIGHT TOTAL HIP ARTHROPLASTY ANTERIOR APPROACH;  Surgeon: Tanner Alf, MD;  Location: [facility];  Service: Orthopedics;  Laterality: Right;  WOUND CLASSIFICATION-CLEAN  . TOTAL HIP ARTHROPLASTY Left 09/18/2014   Procedure: LEFT TOTAL HIP ARTHROPLASTY ANTERIOR APPROACH;  Surgeon: Tanner Arabian, MD;  Location: [facility];  Service: Orthopedics;  Laterality: Left;  . UMBILICAL HERNIA REPAIR  03/01/2013    Family History  Problem Relation Tanner [name] of Onset  . Heart attack Father 74  . Heart disease Father   . Lung disease Mother 2  . Colon cancer Paternal Grandmother     Tanner [name] 75-70's  . Diabetes Brother     Social History:  reports that he has never smoked. He has never used smokeless tobacco. He reports that he drinks about 4.8 oz of alcohol per week . He reports that he does not use drugs.   Medications Prior to  Admission  Medication Sig Dispense Refill  . Aloe-Sodium Chloride (AYR SALINE NASAL GEL NA) Place 1 application into the nose 2 (two) times daily as needed (dryness).    Marland Kitchen aspirin 81 MG tablet Take 81 mg by mouth at bedtime.     . Biotin 1000 MCG tablet Take 1,000 mcg by mouth daily.    . Carboxymethylcellulose Sodium (REFRESH TEARS OP) Apply 1 drop to eye 3 (three) times daily as needed (dry eyes).    . cholecalciferol (VITAMIN D) 1000 UNITS tablet Take 1,000 Units by mouth daily.    . clotrimazole-betamethasone (LOTRISONE) cream Apply 1 application topically 2 (two) times daily as needed (ear itching).    Marland Kitchen doxycycline (VIBRAMYCIN) 100 MG capsule Take 1 capsule by mouth daily.     . Eyelid Cleansers (HM EYELID WIPES) PADS Apply topically. Wipe eyes clean once daily    . finasteride (PROSCAR) 5 MG tablet Take 5 mg by mouth daily.    . fluticasone (FLONASE) 50 MCG/ACT nasal spray Place 2 sprays into both nostrils daily.    . Glucosamine-Chondroit-Vit C-Mn (GLUCOSAMINE CHONDR 1500 COMPLX PO)  Take 1 tablet by mouth 2 (two) times daily.    Marland Kitchen ibuprofen (ADVIL,MOTRIN) 200 MG tablet Take 200-400 mg by mouth every 6 (six) hours as needed for headache or moderate pain.     Marland Kitchen omeprazole (PRILOSEC) 40 MG capsule Take 40 mg by mouth daily.    . permethrin (ELIMITE) 5 % cream Apply 1 application topically 2 (two) times daily.    . Probiotic CAPS Take 1 capsule by mouth daily.    . simvastatin (ZOCOR) 20 MG tablet Take 20 mg by mouth every evening.    . sodium chloride (OCEAN) 0.65 % SOLN nasal spray Place 1 spray into both nostrils 2 (two) times daily as needed (dryness).    . Vilazodone HCl (VIIBRYD) 20 MG TABS Take 20 mg by mouth every morning.     Marland Kitchen amoxicillin (AMOXIL) 500 MG tablet Take 2,000 mg by mouth as needed. 1 hour prior to dental procedures    . sildenafil (REVATIO) 20 MG tablet Take 20-100 mg by mouth as needed (ED).        Physical Exam: left shoulder with painful and restricted motion as  noted at recent office visits  Vitals  Temp:  [98 F (36.7 C)] 98 F (36.7 C) (11/30 1201) Pulse Rate:  [56] 56 (11/30 1201) Resp:  [18] 18 (11/30 1201) BP: (148)/(87) 148/87 (11/30 1201) SpO2:  [100 %] 100 % (11/30 1201) Weight:  [83 kg (183 lb)] 83 kg (183 lb) (11/30 1201)  Assessment/Plan  Impression: LEFT SHOULDER OA  Plan of Action: Procedure(s): TOTAL SHOULDER ARTHROPLASTY  Tanner Tanner [name] M Tanner Tanner [name] 05/01/2016, 12:14 PM Contact # [phone]

## 2016-05-01 NOTE — Op Note (Signed)
05/01/2016  3:03 PM  PATIENT:   Tanner [name]  70 y.o. male  PRE-OPERATIVE DIAGNOSIS:  LEFT SHOULDER OA  POST-OPERATIVE DIAGNOSIS:  same  PROCEDURE:  L TSA, #10, 50x19 concentric head, medium glenoid  SURGEON:  Cicero Noy, Metta Clines M.D.  ASSISTANTS: Shuford pac   ANESTHESIA:   GEt + ISB  EBL: 200  SPECIMEN:  none  Drains: none   PATIENT DISPOSITION:  PACU - hemodynamically stable.    PLAN OF CARE: Admit for overnight observation  Dictation# [id number]   Contact # [phone]

## 2016-05-01 NOTE — Discharge Instructions (Signed)

## 2016-05-01 NOTE — Anesthesia Procedure Notes (Signed)
Procedure Name: Intubation Date/Time: 05/01/2016 1:18 PM Performed by: Rica Koyanagi Pre-anesthesia Checklist: Patient identified, Emergency Drugs available, Suction available and Patient being monitored Patient Re-evaluated:Patient Re-evaluated prior to inductionOxygen Delivery Method: Circle system utilized Preoxygenation: Pre-oxygenation with 100% oxygen Intubation Type: IV induction Ventilation: Mask ventilation without difficulty Laryngoscope Size: Mac, 4 and Glidescope (MAC 4 with grade 3 to 4, then glidescope with grade 1) Grade View: Grade IV Tube type: Oral Tube size: 7.0 mm Number of attempts: 2 (DLx1 with intubation of esophagus, then DL with glidescope by MDA ) Airway Equipment and Method: Stylet and Video-laryngoscopy Placement Confirmation: ETT inserted through vocal cords under direct vision,  positive ETCO2 and breath sounds checked- equal and bilateral Secured at: 23 cm Tube secured with: Tape Dental Injury: Teeth and Oropharynx as per pre-operative assessment  Difficulty Due To: Difficulty was unanticipated

## 2016-05-01 NOTE — Anesthesia Procedure Notes (Addendum)
Anesthesia Regional Block:  Interscalene brachial plexus block  Pre-Anesthetic Checklist: ,, timeout performed, Correct Patient, Correct Site, Correct Laterality, Correct Procedure, Correct Position, site marked, Risks and benefits discussed,  Surgical consent,  Pre-op evaluation,  At surgeon's request and post-op pain management  Laterality: Upper and Right  Prep: chloraprep, alcohol swabs       Needles:  Injection technique: Single-shot  Needle Type: Stimulator Needle - 40     Needle Length: 4cm 4 cm Needle Gauge: 22 and 22 G  Needle insertion depth: 4 cm   Additional Needles:  Procedures: ultrasound guided (picture in chart) and nerve stimulator Interscalene brachial plexus block  Nerve Stimulator or Paresthesia:  Response: Twitch elicited, 0.5 mA, 0.3 ms,   Additional Responses:   Narrative:  Start time: 05/01/2016 12:10 PM End time: 05/01/2016 12:25 PM Injection made incrementally with aspirations every 5 mL.  Performed by: Personally  Anesthesiologist: Chaysen Tillman  Additional Notes: Block assessed prior to start of surgery

## 2016-05-01 NOTE — Anesthesia Preprocedure Evaluation (Signed)
Anesthesia Evaluation  Patient identified by MRN, date of birth, ID band Patient awake    Reviewed: Allergy & Precautions, NPO status , Patient's Chart, lab work & pertinent test results  Airway Mallampati: II   Neck ROM: Full    Dental  (+) Teeth Intact   Pulmonary sleep apnea ,    breath sounds clear to auscultation       Cardiovascular + Peripheral Vascular Disease   Rhythm:Regular Rate:Normal     Neuro/Psych    GI/Hepatic Neg liver ROS, GERD  ,  Endo/Other  negative endocrine ROS  Renal/GU negative Renal ROS     Musculoskeletal  (+) Arthritis ,   Abdominal   Peds  Hematology negative hematology ROS (+)   Anesthesia Other Findings   Reproductive/Obstetrics                             Anesthesia Physical Anesthesia Plan  ASA: II  Anesthesia Plan: General   Post-op Pain Management:  Regional for Post-op pain   Induction: Intravenous  Airway Management Planned: Oral ETT  Additional Equipment:   Intra-op Plan:   Post-operative Plan: Extubation in OR  Informed Consent: I have reviewed the patients History and Physical, chart, labs and discussed the procedure including the risks, benefits and alternatives for the proposed anesthesia with the patient or authorized representative who has indicated his/her understanding and acceptance.     Plan Discussed with:   Anesthesia Plan Comments: (preop block discussed in detail c patient)        Anesthesia Quick Evaluation

## 2016-05-01 NOTE — Transfer of Care (Signed)
Immediate Anesthesia Transfer of Care Note  Patient: Tanner [name]  Procedure(s) Performed: Procedure(s): TOTAL SHOULDER ARTHROPLASTY (Left)  Patient Location: PACU  Anesthesia Type:General  Level of Consciousness: lethargic and responds to stimulation  Airway & Oxygen Therapy: Patient Spontanous Breathing and Patient connected to nasal cannula oxygen  Post-op Assessment: Report given to RN  Post vital signs: Reviewed and stable  Last Vitals:  Vitals:   05/01/16 1246 05/01/16 1247  BP:    Pulse: 62 65  Resp: 14 19    Last Pain:  Vitals:   05/01/16 1201  TempSrc: Oral  PainSc:       Patients Stated Pain Goal: 1 ([id number])  Complications: No apparent anesthesia complications

## 2016-05-02 ENCOUNTER — Encounter (HOSPITAL_COMMUNITY): Payer: Self-pay | Admitting: Orthopedic Surgery

## 2016-05-02 MED ORDER — METHOCARBAMOL 500 MG PO TABS: 500.0000 mg | ORAL_TABLET | Freq: Three times a day (TID) | ORAL | 1 refills | Status: DC | PRN

## 2016-05-02 MED ORDER — ONDANSETRON HCL 4 MG PO TABS: 4.0000 mg | ORAL_TABLET | Freq: Three times a day (TID) | ORAL | 0 refills | Status: DC | PRN

## 2016-05-02 MED ORDER — OXYCODONE-ACETAMINOPHEN 5-325 MG PO TABS: 1.0000 | ORAL_TABLET | ORAL | 0 refills | Status: DC | PRN

## 2016-05-02 NOTE — Anesthesia Postprocedure Evaluation (Signed)
Anesthesia Post Note  Patient: Tanner [name]  Procedure(s) Performed: Procedure(s) (LRB): TOTAL SHOULDER ARTHROPLASTY (Left)  Patient location during evaluation: PACU Anesthesia Type: Regional and General Level of consciousness: awake Pain management: pain level controlled Vital Signs Assessment: post-procedure vital signs reviewed and stable Respiratory status: spontaneous breathing Cardiovascular status: stable Postop Assessment: no signs of nausea or vomiting Anesthetic complications: no    Last Vitals:  Vitals:   05/02/16 0142 05/02/16 0632  BP: 101/60 104/60  Pulse: 69 63  Resp: 16 16  Temp: 37.2 C 36.7 C    Last Pain:  Vitals:   05/02/16 0632  TempSrc: Oral  PainSc:                  Valinda Fedie

## 2016-05-02 NOTE — Discharge Summary (Signed)
PATIENT ID:      Tanner [name]  MRN:     [id number] DOB/AGE:    71-01-1946 / 71 y.o.     DISCHARGE SUMMARY  ADMISSION DATE:    05/01/2016 DISCHARGE DATE:    ADMISSION DIAGNOSIS: LEFT SHOULDER OA Past Medical History:  Diagnosis Date  . ADD (attention deficit disorder)   . Anxiety   . Arm thromboembolism, superficial, acute    from IV site, left arm  . Arthritis    "thumbs; right knee" (05/01/2016)  . Basal cell carcinoma    left ear; right shoulder; chest; top of head"  . BPH (benign prostatic hypertrophy)   . Depression   . Esophageal reflux   . High cholesterol   . IBS (irritable bowel syndrome)   . Rosacea   . Sleep apnea    central sleep apnea    DISCHARGE DIAGNOSIS:   Active Problems:   S/P shoulder replacement, left   PROCEDURE: Procedure(s): TOTAL SHOULDER ARTHROPLASTY on 05/01/2016  CONSULTS:    HISTORY:  See H&P in chart.  HOSPITAL COURSE:  YASIN CURATOLA is a 70 y.o. admitted on 05/01/2016 with a diagnosis of LEFT SHOULDER OA.  They were brought to the operating room on 05/01/2016 and underwent Procedure(s): TOTAL SHOULDER ARTHROPLASTY.    They were given perioperative antibiotics: Anti-infectives    Start     Dose/Rate Route Frequency Ordered Stop   05/01/16 2000  ceFAZolin (ANCEF) IVPB 2g/100 mL premix     2 g 200 mL/hr over 30 Minutes Intravenous Every 6 hours 05/01/16 1910 05/02/16 0831   05/01/16 1915  doxycycline (VIBRA-TABS) tablet 100 mg     100 mg Oral Daily 05/01/16 1910     05/01/16 1056  ceFAZolin (ANCEF) 2-4 GM/100ML-% IVPB    Comments:  Fabian Sharp   : cabinet override      05/01/16 1056 05/01/16 1320   05/01/16 1052  ceFAZolin (ANCEF) IVPB 2g/100 mL premix     2 g 200 mL/hr over 30 Minutes Intravenous On call to O.R. 05/01/16 1052 05/01/16 1320    .  Patient underwent the above named procedure and tolerated it well. The following day they were hemodynamically stable and pain was controlled on oral analgesics. They were  neurovascularly intact to the operative extremity. OT was ordered and worked with patient per protocol. They were medically and orthopaedically stable for discharge on day 1.    DIAGNOSTIC STUDIES:  RECENT RADIOGRAPHIC STUDIES :  Dg Chest 2 View  Result Date: 04/10/2016 CLINICAL DATA:  Cough for 5 months or more, some sob, no other chest symptoms, no hx of chest surg. EXAM: CHEST  2 VIEW COMPARISON:  08/29/2014 FINDINGS: The heart size and mediastinal contours are within normal limits. Both lungs are clear. The visualized skeletal structures are unremarkable. IMPRESSION: No active cardiopulmonary disease. Electronically Signed   By: Nolon Nations M.D.   On: 04/10/2016 15:48    RECENT VITAL SIGNS:  Patient Vitals for the past 24 hrs:  BP Temp Temp src Pulse Resp SpO2 Weight  05/02/16 0632 104/60 98 F (36.7 C) Oral 63 16 96 % -  05/02/16 0142 101/60 98.9 F (37.2 C) Oral 69 16 95 % -  05/01/16 2035 115/63 98.2 F (36.8 C) Oral 79 18 99 % -  05/01/16 1810 116/60 97.7 F (36.5 C) - 76 15 98 % -  05/01/16 1740 113/69 - - 71 15 98 % -  05/01/16 1710 109/66 - - 65 12 99 % -  05/01/16 1640 102/72 - - 67 19 98 % -  05/01/16 1610 106/64 - - 63 14 98 % -  05/01/16 1555 102/67 - - 65 14 100 % -  05/01/16 1540 113/63 - - 67 12 100 % -  05/01/16 1525 118/64 - - 74 12 100 % -  05/01/16 1510 127/67 97.5 F (36.4 C) - 73 14 100 % -  05/01/16 1247 - - - 65 19 100 % -  05/01/16 1246 - - - 62 14 100 % -  05/01/16 1245 140/62 - - 65 14 100 % -  05/01/16 1240 - - - 64 14 100 % -  05/01/16 1235 132/62 - - 63 16 100 % -  05/01/16 1230 132/62 - - 63 11 100 % -  05/01/16 1225 129/70 - - 61 17 100 % -  05/01/16 1220 - - - (!) 56 14 98 % -  05/01/16 1215 (!) 150/90 - - 60 12 100 % -  05/01/16 1210 - - - 61 17 100 % -  05/01/16 1205 (!) 148/87 - - (!) 58 19 100 % -  05/01/16 1201 (!) 148/87 - Oral (!) 56 18 100 % 83 kg (183 lb)  .  RECENT EKG RESULTS:    Orders placed or performed during the  hospital encounter of 05/01/16  . EKG 12-Lead  . EKG 12-Lead    DISCHARGE INSTRUCTIONS:    DISCHARGE MEDICATIONS:     Medication List    TAKE these medications   amoxicillin 500 MG tablet Commonly known as:  AMOXIL Take 2,000 mg by mouth as needed. 1 hour prior to dental procedures   aspirin 81 MG tablet Take 81 mg by mouth at bedtime.   AYR SALINE NASAL GEL NA Place 1 application into the nose 2 (two) times daily as needed (dryness).   Biotin 1000 MCG tablet Take 1,000 mcg by mouth daily.   cholecalciferol 1000 units tablet Commonly known as:  VITAMIN D Take 1,000 Units by mouth daily.   clotrimazole-betamethasone cream Commonly known as:  LOTRISONE Apply 1 application topically 2 (two) times daily as needed (ear itching).   doxycycline 100 MG capsule Commonly known as:  VIBRAMYCIN Take 1 capsule by mouth daily.   finasteride 5 MG tablet Commonly known as:  PROSCAR Take 5 mg by mouth daily.   fluticasone 50 MCG/ACT nasal spray Commonly known as:  FLONASE Place 2 sprays into both nostrils daily.   GLUCOSAMINE CHONDR 1500 COMPLX PO Take 1 tablet by mouth 2 (two) times daily.   HM EYELID WIPES Pads Apply topically. Wipe eyes clean once daily   ibuprofen 200 MG tablet Commonly known as:  ADVIL,MOTRIN Take 200-400 mg by mouth every 6 (six) hours as needed for headache or moderate pain.   methocarbamol 500 MG tablet Commonly known as:  ROBAXIN Take 1 tablet (500 mg total) by mouth every 8 (eight) hours as needed for muscle spasms.   omeprazole 40 MG capsule Commonly known as:  PRILOSEC Take 40 mg by mouth daily.   ondansetron 4 MG tablet Commonly known as:  ZOFRAN Take 1 tablet (4 mg total) by mouth every 8 (eight) hours as needed for nausea or vomiting.   oxyCODONE-acetaminophen 5-325 MG tablet Commonly known as:  PERCOCET Take 1-2 tablets by mouth every 4 (four) hours as needed.   permethrin 5 % cream Commonly known as:  ELIMITE Apply 1  application topically 2 (two) times daily.   Probiotic Caps Take 1 capsule by  mouth daily.   REFRESH TEARS OP Apply 1 drop to eye 3 (three) times daily as needed (dry eyes).   sildenafil 20 MG tablet Commonly known as:  REVATIO Take 20-100 mg by mouth as needed (ED).   simvastatin 20 MG tablet Commonly known as:  ZOCOR Take 20 mg by mouth every evening.   sodium chloride 0.65 % Soln nasal spray Commonly known as:  OCEAN Place 1 spray into both nostrils 2 (two) times daily as needed (dryness).   VIIBRYD 20 MG Tabs Generic drug:  Vilazodone HCl Take 20 mg by mouth every morning.       FOLLOW UP VISIT:   Follow-up Information    Metta Clines SUPPLE, MD.   Specialty:  Orthopedic Surgery Why:  call to be seen in 10-14 days Contact information: [address] [place] [address] [id number]           DISCHARGE TO: Home   DISPOSITION: Good  DISCHARGE CONDITION:  Festus Barren for Dr. Justice Britain 05/02/2016, 8:32 AM

## 2016-05-02 NOTE — Op Note (Signed)
NAMEMarland Kitchen  ARDIS, VOTTO NO.:  [id number]  MEDICAL RECORD NO.:  [id number]  LOCATION:  5N12C                        FACILITY:  [facility]  PHYSICIAN:  Metta Clines. Margrit Minner, M.D.  DATE OF BIRTH:  [birth year]  DATE OF PROCEDURE:  05/01/2016 DATE OF DISCHARGE:                              OPERATIVE REPORT   PREOPERATIVE DIAGNOSIS:  End-stage left shoulder osteoarthritis.  POSTOPERATIVE DIAGNOSIS:  End-stage left shoulder osteoarthritis.  PROCEDURE:  Left total shoulder arthroplasty utilizing a press-fit size 10 Arthrex stem, a 50 x 19 concentric head and a medium glenoid.  SURGEON:  Metta Clines. Evelise Reine, M.D.  Terrence DupontOlivia Mackie A. Shuford, PA-C.  ANESTHESIA:  General endotracheal as well as interscalene blocks.  ESTIMATED BLOOD LOSS:  200 mL.  DRAINS:  None.  HISTORY:  [name] is a 70 year old gentleman, who has had chronic and progressive increasing left shoulder pain related to end-stage osteoarthritis.  Plain radiographs confirm marked degenerative change with osteophytic spurring, subchondral sclerosis, and generalized osteoarthritic findings.  Due to his ongoing pain and increasing functional limitations, he is brought to the operating room at this time for planned left total shoulder arthroplasty.  Preoperatively, I counseled [name] regarding treatment options and potential risks versus benefits thereof.  Possible surgical complications were all reviewed including bleeding, infection, neurovascular injury, persistent pain, loss of motion, anesthetic complication, failure of the implant, and possible need for additional surgery.  He understands and accepts and agrees with our planned procedure.  PROCEDURE IN DETAIL:  After undergoing routine preop evaluation, the patient received prophylactic antibiotics.  An interscalene block was established in the holding area with the Anesthesia Department.  Placed supine on the operating table, underwent smooth  induction of general endotracheal anesthesia.  Placed in beach chair position and appropriate padded and protected.  Left shoulder region was sterilely prepped and draped in a standard fashion.  Time-out was called.  An anterior deltopectoral approach to the left shoulder was made through an 8 cm incision.  Skin flaps were elevated.  Electrocautery was used for hemostasis.  Dissection carried deeply.  Cephalic vein taken laterally with the deltoid.  Peck major retracted medially with the upper centimeter of the tendon tenotomized to enhance exposure.  We divided adhesions beneath the deltoid and mobilized the conjoined tendon and retracted this medially.  We then unroofed the biceps tendon.  This was tenotomized for later tenodesis.  We then split the rotator cuff along the rotator interval to the base of the coracoid and then separated the subscap from the lesser tuberosity leaving a 1 cm cuff of tissue for later repair and then tagged the free margin of the subscap with a series of #2 FiberWire locking sutures.  We then divided the capsular attachments from the anterior-inferior and inferior aspect of the humeral head allowing delivery of head through the wound.  We carefully protected the rotator cuff superiorly and posteriorly, outlined our proposed humeral head resection with the extramedullary guide, and then performed the resection with an oscillating saw, protecting surrounding soft tissues.  We then used a rongeur to remove the osteophytes on the anterior and inferior aspects of the humeral neck.  At this time, we prepared the  humeral canal, hand reaming up to size 7 and broached up to size 10 with excellent fit and fixation maintaining the native retroversion of approximately 30 degrees.  At this point, a size 9 trial with metal cap was placed in the humeral canal.  We then exposed the glenoid with combination of Fukuda, pitchfork, and snake tongue retractors.  I performed a  circumferential labral resection, gained complete visualization of the entire to the glenoid, placed a guidepin into the center as we measured for medium glenoid.  The glenoid was then reamed to a stable subchondral bony bed.  We then placed our central drill hole followed by the peripheral PEG and slot respectively.  The glenoid was then broached and the trial glenoid showed excellent fit and fixation.  The glenoid was then carefully irrigated, meticulously cleaned and dried.  We then mixed cement and introduced this into the superior and inferior PEG and slot respectively and then we impacted our glenoid with excellent fit and fixation.  We then returned our attention to the proximal humerus where we implanted our size 10 prosthesis, impacted this to the appropriate level.  We then performed terminal tightening of the proximal locking screws.  We then performed a series of trial reductions. The 50 x 19 concentric head showed the best soft tissue balance __________.  We cleaned the Morse taper and applied the 50 x 19 head, packed this firmly, and performed our final reduction, which again showed approximately 50% translation of the humeral head and glenoid and an excellent soft tissue balance.  We then repaired the subscap back to the lesser tuberosity through the cuff tissue with the #2 FiberWires in a horizontal mattress pattern and closed rotator interval with a pair of figure-of-eight #2 FiberWire sutures.  The biceps tendon was then tenodesed at the upper border of the pec major with #2 FiberWires.  The wound then was copiously irrigated.  We confirmed that easily the armpit external rotate to 40 degrees and did mobilize subscap prior to repair.  The wound was then copiously irrigated.  Hemostasis was obtained.  The deltopectoral interval was then reapproximated with a series of figure-of-eight #1 Vicryl sutures, 2-0 Monocryl used for the subcu layer, intracuticular 3-0 Monocryl  for the skin followed by Dermabond and Aquacel dressing.  Left arm was placed in a sling.  The patient was awakened, extubated, and taken to recovery room in stable condition.  Jenetta Loges, PA-C was used as an Environmental consultant throughout this case, was essential for help with positioning the patient, positioning the extremity, management of the retractors, tissue manipulation, implantation of prosthesis, wound closure, and intraoperative decision making.     Metta Clines. Naquisha Whitehair, M.D.     KMS/MEDQ  D:  05/01/2016  T:  05/02/2016  Job:  [id number]

## 2016-05-02 NOTE — Care Management (Signed)
Patient has no home health needs.

## 2016-05-02 NOTE — Plan of Care (Signed)
Problem: Physical Regulation: Goal: Postoperative complications will be avoided or minimized Outcome: Progressing No complications noted this shift  Problem: Pain Management: Goal: Pain level will decrease with appropriate interventions Outcome: Progressing pain is well managed this shift

## 2016-05-02 NOTE — Evaluation (Signed)
Occupational Therapy Evaluation Patient Details Name: Tanner [name] MRN: [id number] DOB: 09-24-1945 Today's Date: 05/02/2016    History of Present Illness Pt is a 70 y.o. male s/p L total shoulder arthroplasty. Pt has a PMH significant for ADD, anxiety, arthritis, basal cell carcinoma, esophageal reflus, IBS, rosacea, and sleep apnea.   Clinical Impression   PTA, pt was independent with ADL and IADL and maintained an active lifestyle. Pt currently requires min assist for UB ADL and supervision for LB ADL and functional mobility. Pt will have 24 hour assistance at home from wife who was present and engaged in session. Completed education concerning passive shoulder protocol, dressing and bathing techniques, fall prevention, and safety post-acute D/C. Pt reports no further questions or concerns and has no further acute OT needs. OT will sign off.    Follow Up Recommendations  No OT follow up;Supervision/Assistance - 24 hour;Other (comment) (Follow-up per MD)    Equipment Recommendations  None recommended by OT    Recommendations for Other Services       Precautions / Restrictions Precautions Precautions: Shoulder Type of Shoulder Precautions: Passive protocol: sling (may come off in controlled environment), NWB LUE, no AROM LUE, elbow/wrist/hand AROM, pendulums OK, PROM L shoulder (forward flexion: 0-90, abduction: 0-60, external rotation: 0-30) Shoulder Interventions: Shoulder sling/immobilizer;Off for dressing/bathing/exercises (May come off in controlled sitting environment) Precaution Booklet Issued: Yes (comment) Required Braces or Orthoses: Sling Restrictions Weight Bearing Restrictions: Yes LUE Weight Bearing: Non weight bearing      Mobility Bed Mobility Overal bed mobility: Modified Independent                Transfers Overall transfer level: Needs assistance   Transfers: Sit to/from Stand Sit to Stand: Supervision              Balance Overall balance  assessment: Needs assistance Sitting-balance support: No upper extremity supported;Feet supported Sitting balance-Leahy Scale: Good     Standing balance support: No upper extremity supported;During functional activity Standing balance-Leahy Scale: Good Standing balance comment: Requires supervision for safety.                            ADL Overall ADL's : Needs assistance/impaired     Grooming: Supervision/safety;Standing   Upper Body Bathing: Minimal assitance;Sitting   Lower Body Bathing: Supervison/ safety;Sit to/from stand   Upper Body Dressing : Minimal assistance;Sitting   Lower Body Dressing: Supervision/safety;Sit to/from stand   Toilet Transfer: Supervision/safety;Ambulation;Regular Toilet   Toileting- Water quality scientist and Hygiene: Supervision/safety;Sit to/from stand   Tub/ Shower Transfer: Supervision/safety;Ambulation   Functional mobility during ADLs: Supervision/safety General ADL Comments: Educated pt on dressing and bathing techniques while adhering to passive shoulder precautions.     Vision Vision Assessment?: No apparent visual deficits   Perception     Praxis      Pertinent Vitals/Pain Pain Assessment: Faces Faces Pain Scale: Hurts little more Pain Location: L shoulder Pain Descriptors / Indicators: Aching;Operative site guarding;Sore Pain Intervention(s): Limited activity within patient's tolerance;Monitored during session;Repositioned;Ice applied     Hand Dominance Right   Extremity/Trunk Assessment Upper Extremity Assessment Upper Extremity Assessment: LUE deficits/detail LUE Deficits / Details: Decreased strength and ROM as expected post-operatively. LUE: Unable to fully assess due to immobilization LUE Sensation: decreased light touch (ulnar nerve distribution) LUE Coordination:  (intact)   Lower Extremity Assessment Lower Extremity Assessment: Overall WFL for tasks assessed       Communication  Communication Communication: No difficulties  Cognition Arousal/Alertness: Awake/alert Behavior During Therapy: WFL for tasks assessed/performed Overall Cognitive Status: Within Functional Limits for tasks assessed                     General Comments       Exercises Exercises: Shoulder     Shoulder Instructions Shoulder Instructions Donning/doffing shirt without moving shoulder: Minimal assistance Method for sponge bathing under operated UE: Minimal assistance Donning/doffing sling/immobilizer: Minimal assistance Correct positioning of sling/immobilizer: Supervision/safety Pendulum exercises (written home exercise program): Supervision/safety ROM for elbow, wrist and digits of operated UE: Supervision/safety Sling wearing schedule (on at all times/off for ADL's): Supervision/safety Proper positioning of operated UE when showering: Supervision/safety Positioning of UE while sleeping: Supervision/safety    Home Living Family/patient expects to be discharged to:: Private residence Living Arrangements: Spouse/significant other Available Help at Discharge: Family;Available 24 hours/day Type of Home: House             Bathroom Shower/Tub: Tub/shower unit Shower/tub characteristics: Architectural technologist: Standard     Home Equipment: Civil engineer, contracting - built in          Prior Functioning/Environment Level of Independence: Independent        Comments: Retired Patent examiner Problem List: Decreased strength;Decreased range of motion;Decreased activity tolerance;Impaired balance (sitting and/or standing);Pain   OT Treatment/Interventions:      OT Goals(Current goals can be found in the care plan section) Acute Rehab OT Goals Patient Stated Goal: to go home OT Goal Formulation: With patient/family Time For Goal Achievement: 05/16/16 Potential to Achieve Goals: Good  OT Frequency:     Barriers to D/C:            Co-evaluation               End of Session Equipment Utilized During Treatment:  (L shoulder sling) Nurse Communication: Other (comment) (OT complete)  Activity Tolerance: Patient tolerated treatment well Patient left: in chair;with family/visitor present;with call bell/phone within reach   Time: [id number] OT Time Calculation (min): 42 min Charges:  OT General Charges $OT Visit: 1 Procedure OT Evaluation $OT Eval Moderate Complexity: 1 Procedure OT Treatments $Self Care/Home Management : 23-37 mins  Norman Herrlich, OTR/L [id number] 05/02/2016, 11:08 AM

## 2016-07-30 ENCOUNTER — Other Ambulatory Visit: Payer: Self-pay | Admitting: Dermatology

## 2016-11-24 ENCOUNTER — Other Ambulatory Visit: Payer: Self-pay | Admitting: Dermatology

## 2017-03-18 ENCOUNTER — Other Ambulatory Visit: Payer: Self-pay | Admitting: Dermatology

## 2017-05-22 ENCOUNTER — Other Ambulatory Visit: Payer: Self-pay | Admitting: Family Medicine

## 2017-05-22 DIAGNOSIS — N63 Unspecified lump in unspecified breast: Secondary | ICD-10-CM

## 2017-05-25 ENCOUNTER — Other Ambulatory Visit: Payer: Self-pay | Admitting: Family Medicine

## 2017-05-25 ENCOUNTER — Ambulatory Visit
Admission: RE | Admit: 2017-05-25 | Discharge: 2017-05-25 | Disposition: A | Discharge status: Home / Self Care | Payer: Medicare Other | Source: Ambulatory Visit | Attending: Family Medicine | Admitting: Family Medicine

## 2017-05-25 ENCOUNTER — Ambulatory Visit: Payer: Medicare Other

## 2017-05-25 DIAGNOSIS — N63 Unspecified lump in unspecified breast: Secondary | ICD-10-CM

## 2017-07-15 ENCOUNTER — Ambulatory Visit (INDEPENDENT_AMBULATORY_CARE_PROVIDER_SITE_OTHER): Payer: Medicare Other | Admitting: Endocrinology

## 2017-07-15 ENCOUNTER — Encounter: Payer: Self-pay | Admitting: Endocrinology

## 2017-07-15 DIAGNOSIS — N62 Hypertrophy of breast: Secondary | ICD-10-CM | POA: Diagnosis not present

## 2017-07-15 LAB — TSH: TSH: 3.51 u[IU]/mL (ref 0.35–4.50)

## 2017-07-15 LAB — HCG, QUANTITATIVE, PREGNANCY: Quantitative HCG: 1.09 m[IU]/mL

## 2017-07-15 NOTE — Patient Instructions (Addendum)
blood tests are requested for you today.  We'll let you know about the results. If these tests are OK, options are to hold off on treatment for this, take a pill against estrogen, or to revisit the finasteride.

## 2017-07-15 NOTE — Progress Notes (Signed)
Subjective:    Patient ID: Tanner [name], male    DOB: [birth year], 72 y.o.   MRN: [id number]  HPI Pt is referred by Dr Dorthy Cooler, for gynecomastia.  Pt was the product of a normal pregnancy and delivery.  he had puberty at the normal age.  He has 2 biological children. He has never been diagnosed with hypogonadism.  He denies any h/o infertility.  He has never had liver disease, kidney disease, cancer, cystic fibrosis, ulcerative colitis, or alcoholism. He has never taken cimetidine, calcium channel blockers, growth hormone, risperidone, hCG, opioids, androgens, cancer chemotherapy, or estrogens.  He now reports 4 mos of moderate swelling at both breasts (R>L), and slight assoc pain.  He says this started with trintellix, but did not resolve with discontinuation.  He takes finasteride for BPH since 2016.  He takes ketoconazole topically only.   Past Medical History:  Diagnosis Date  . ADD (attention deficit disorder)   . Anxiety   . Arm thromboembolism, superficial, acute    from IV site, left arm  . Arthritis    "thumbs; right knee" (05/01/2016)  . Basal cell carcinoma    left ear; right shoulder; chest; top of head"  . BPH (benign prostatic hypertrophy)   . Depression   . Esophageal reflux   . High cholesterol   . IBS (irritable bowel syndrome)   . Rosacea   . Sleep apnea    central sleep apnea    Past Surgical History:  Procedure Laterality Date  . BASAL CELL CARCINOMA EXCISION     left ear; right shoulder; top of head"  . BUNIONECTOMY Left   . CATARACT EXTRACTION W/ INTRAOCULAR LENS  IMPLANT, BILATERAL Bilateral   . CLAVICLE SURGERY Left 1980s   "cut an inch or so off it"  . CLOSED REDUCTION HAND FRACTURE Left ~ 1995   "later took the screws out w/pliers"  . COLONOSCOPY  10 years ago   pt does not know where/when  . ELBOW SURGERY Right    "had a tear; sewed it back onto the bone"  . HERNIA REPAIR    . INGUINAL HERNIA REPAIR Left 2001  . JOINT REPLACEMENT    . KNEE  ARTHROSCOPY Right   . MOUTH SURGERY     teeth removal/implants placed  . NEUROMA SURGERY Left    foot  . PLANTAR FASCIA RELEASE Right   . SHOULDER ARTHROSCOPY W/ ROTATOR CUFF REPAIR Right   . TONSILLECTOMY    . TOTAL HIP ARTHROPLASTY Right 05/11/2013   Procedure: RIGHT TOTAL HIP ARTHROPLASTY ANTERIOR APPROACH;  Surgeon: Gearlean Alf, MD;  Location: [facility];  Service: Orthopedics;  Laterality: Right;  WOUND CLASSIFICATION-CLEAN  . TOTAL HIP ARTHROPLASTY Left 09/18/2014   Procedure: LEFT TOTAL HIP ARTHROPLASTY ANTERIOR APPROACH;  Surgeon: Gaynelle Arabian, MD;  Location: [facility];  Service: Orthopedics;  Laterality: Left;  . TOTAL SHOULDER ARTHROPLASTY Left 05/01/2016  . TOTAL SHOULDER ARTHROPLASTY Left 05/01/2016   Procedure: TOTAL SHOULDER ARTHROPLASTY;  Surgeon: Justice Britain, MD;  Location: [place];  Service: Orthopedics;  Laterality: Left;  . UMBILICAL HERNIA REPAIR  03/01/2013    Social History   Socioeconomic History  . Marital status: Widowed    Spouse name: Not on file  . Number of children: 2  . Years of education: Not on file  . Highest education level: Not on file  Social Needs  . Financial resource strain: Not on file  . Food insecurity - worry: Not on file  . Food insecurity -  inability: Not on file  . Transportation needs - medical: Not on file  . Transportation needs - non-medical: Not on file  Occupational History  . Occupation: retired  Tobacco Use  . Smoking status: Never Smoker  . Smokeless tobacco: Never Used  Substance and Sexual Activity  . Alcohol use: Yes    Alcohol/week: 4.8 oz    Types: 7 Cans of beer, 1 Shots of liquor per week  . Drug use: No  . Sexual activity: Yes  Other Topics Concern  . Not on file  Social History Narrative  . Not on file    Current Outpatient Medications on File Prior to Visit  Medication Sig Dispense Refill  . Aloe-Sodium Chloride (AYR SALINE NASAL GEL NA) Place 1 application into the nose 2 (two) times daily as needed  (dryness).    Marland Kitchen amoxicillin (AMOXIL) 500 MG tablet Take 2,000 mg by mouth as needed. 1 hour prior to dental procedures    . amphetamine-dextroamphetamine (ADDERALL XR) 20 MG 24 hr capsule Take 20 mg by mouth 2 (two) times daily. Take 1 in the morning & one in the evening.    Marland Kitchen aspirin 81 MG tablet Take 81 mg by mouth at bedtime.     . Biotin 1000 MCG tablet Take 1,000 mcg by mouth daily.    . Carboxymethylcellulose Sodium (REFRESH TEARS OP) Apply 1 drop to eye 3 (three) times daily as needed (dry eyes).    . clotrimazole-betamethasone (LOTRISONE) cream Apply 1 application topically 2 (two) times daily as needed (ear itching).    . Eyelid Cleansers (HM EYELID WIPES) PADS Apply topically. Wipe eyes clean once daily    . finasteride (PROSCAR) 5 MG tablet Take 5 mg by mouth daily.    . fluticasone (FLONASE) 50 MCG/ACT nasal spray Place 2 sprays into both nostrils daily.    . Glucosamine-Chondroit-Vit C-Mn (GLUCOSAMINE CHONDR 1500 COMPLX PO) Take 1 tablet by mouth 2 (two) times daily.    Marland Kitchen ibuprofen (ADVIL,MOTRIN) 200 MG tablet Take 200-400 mg by mouth every 6 (six) hours as needed for headache or moderate pain.     Marland Kitchen ketoconazole (NIZORAL) 2 % cream Apply 1 application topically 3 (three) times a week.    . Multiple Vitamins-Minerals (MULTIVITAMIN PO) Take 1 tablet by mouth daily.    Marland Kitchen omeprazole (PRILOSEC) 40 MG capsule Take 40 mg by mouth daily.    . permethrin (ELIMITE) 5 % cream Apply 1 application topically 2 (two) times daily.    . sildenafil (REVATIO) 20 MG tablet Take 20-100 mg by mouth as needed (ED).     . simvastatin (ZOCOR) 20 MG tablet Take 20 mg by mouth every evening.    . cholecalciferol (VITAMIN D) 1000 UNITS tablet Take 1,000 Units by mouth daily.    Marland Kitchen doxycycline (VIBRAMYCIN) 100 MG capsule Take 1 capsule by mouth daily.     . Probiotic CAPS Take 1 capsule by mouth daily.    . sodium chloride (OCEAN) 0.65 % SOLN nasal spray Place 1 spray into both nostrils 2 (two) times daily as  needed (dryness).    . Vilazodone HCl (VIIBRYD) 20 MG TABS Take 20 mg by mouth every morning.      No current facility-administered medications on file prior to visit.     Allergies  Allergen Reactions  . Valium [Diazepam] Anxiety    Becomes irrational and irritable    Family History  Problem Relation Age of Onset  . Heart attack Father 30  . Heart disease  Father   . Lung disease Mother 82  . Colon cancer Paternal Grandmother        age 37-70's  . Diabetes Brother   . Other Neg Hx        gynecomastia    BP 118/72 (BP Location: Left Arm, Patient Position: Sitting, Cuff Size: Normal)   Pulse 65   Wt 180 lb 6.4 oz (81.8 kg)   SpO2 95%   BMI 25.52 kg/m    Review of Systems denies numbness, weight change, muscle weakness, fever, headache, easy bruising, sob, rash, blurry vision, rhinorrhea, and chest pain.  Depression is well-controlled.  He has ED sxs, night sweats, and decreased urinary stream     Objective:   Physical Exam VS: see vs page GEN: no distress HEAD: head: no deformity eyes: no periorbital swelling, no proptosis external nose and ears are normal mouth: no lesion seen NECK: supple, thyroid is not enlarged CHEST WALL: no deformity LUNGS: clear to auscultation BREASTS:  Slight bilat gynecomastia, R>L CV: reg rate and rhythm, no murmur ABD: abdomen is soft, nontender.  no hepatosplenomegaly.  not distended.  Self-reducing ventral hernia.  GENITALIA:  Normal male.   MUSCULOSKELETAL: muscle bulk and strength are grossly normal.  no obvious joint swelling.  gait is normal and steady.  EXTEMITIES: No leg edema PULSES: no carotid bruit NEURO:  cn 2-12 grossly intact.   readily moves all 4's.  sensation is intact to touch on all 4's SKIN:  Normal texture and temperature.  No rash or suspicious lesion is visible.  Normal hair distribution.  NODES:  None palpable at the neck PSYCH: alert, well-oriented.  Does not appear anxious nor depressed.   Mammography:  moderate to severe right-sided and mild left-sided gynecomastia.  I have reviewed outside records, and summarized: Pt was noted to have gynecomastia, and referred here.  trintellix was d/c'ed, without improvement.  He reported good compliance with meds.   outside test results are reviewed: Hepatic transaminases are normal  Lab Results  Component Value Date   TSH 3.51 07/15/2017      Assessment & Plan:  Gynecomastia, new, uncertain etiology, but finasteride is most likely.   Patient Instructions  blood tests are requested for you today.  We'll let you know about the results. If these tests are OK, options are to hold off on treatment for this, take a pill against estrogen, or to revisit the finasteride.

## 2017-07-16 LAB — PROLACTIN: PROLACTIN: 7.6 ng/mL (ref 2.0–18.0)

## 2017-07-17 LAB — TESTOSTERONE,FREE AND TOTAL
TESTOSTERONE FREE: 9.7 pg/mL (ref 6.6–18.1)
TESTOSTERONE: 584 ng/dL (ref 264–916)

## 2017-07-22 LAB — ESTRADIOL, FREE
Estradiol, Free: 0.56 pg/mL — ABNORMAL HIGH
Estradiol: 32 pg/mL — ABNORMAL HIGH

## 2017-07-30 ENCOUNTER — Encounter: Payer: Self-pay | Admitting: Radiation Oncology

## 2017-08-11 ENCOUNTER — Encounter: Payer: Self-pay | Admitting: Radiation Oncology

## 2017-08-11 NOTE — Progress Notes (Signed)
Patient referred by Dr. Tresa Moore to Dr. Tammi Klippel for consideration of low dose radiation therapy for breast tenderness associated with gynecomastia.   Patient suffers from an enlarged prostate with retention and decreased contractility. Finasteride has been prescribed for the management of this BPH. He has benefited tremendously from this medication. Patient does not tolerate alpha blockers and refuses outlet procedures.   Reports growth of both breast. Reports right breast is the only painful breast. Reports breast pain in right breast has lessened over the last two months. Reports right breast pain increases with touch. Reports right breast pain 2 on a scale of 0-10.   HX of radiation therapy: No Pacemaker: No Hx of methotrexate: No  72 year old male. Widowed. Lost his wife to breast cancer. 2 children.   BP 124/73 (BP Location: Left Arm, Patient Position: Sitting, Cuff Size: Normal)   Pulse (!) 57   Resp 18   Ht 5' 10.5" (1.791 m)   Wt 182 lb 3.2 oz (82.6 kg)   SpO2 100%   BMI 25.77 kg/m  Wt Readings from Last 3 Encounters:  08/13/17 182 lb 3.2 oz (82.6 kg)  07/15/17 180 lb 6.4 oz (81.8 kg)  05/01/16 183 lb (83 kg)

## 2017-08-13 ENCOUNTER — Other Ambulatory Visit: Payer: Self-pay

## 2017-08-13 ENCOUNTER — Encounter: Payer: Self-pay | Admitting: Radiation Oncology

## 2017-08-13 ENCOUNTER — Ambulatory Visit
Admission: RE | Admit: 2017-08-13 | Discharge: 2017-08-13 | Disposition: A | Discharge status: Home / Self Care | Payer: Medicare Other | Source: Ambulatory Visit | Attending: Radiation Oncology | Admitting: Radiation Oncology

## 2017-08-13 VITALS — BP 124/73 | HR 57 | Resp 18 | Ht 70.5 in | Wt 182.2 lb

## 2017-08-13 DIAGNOSIS — F329 Major depressive disorder, single episode, unspecified: Secondary | ICD-10-CM | POA: Insufficient documentation

## 2017-08-13 DIAGNOSIS — Z96643 Presence of artificial hip joint, bilateral: Secondary | ICD-10-CM | POA: Insufficient documentation

## 2017-08-13 DIAGNOSIS — F419 Anxiety disorder, unspecified: Secondary | ICD-10-CM | POA: Diagnosis not present

## 2017-08-13 DIAGNOSIS — N400 Enlarged prostate without lower urinary tract symptoms: Secondary | ICD-10-CM | POA: Insufficient documentation

## 2017-08-13 DIAGNOSIS — G4731 Primary central sleep apnea: Secondary | ICD-10-CM | POA: Insufficient documentation

## 2017-08-13 DIAGNOSIS — R339 Retention of urine, unspecified: Secondary | ICD-10-CM | POA: Insufficient documentation

## 2017-08-13 DIAGNOSIS — Z96612 Presence of left artificial shoulder joint: Secondary | ICD-10-CM | POA: Diagnosis not present

## 2017-08-13 DIAGNOSIS — K219 Gastro-esophageal reflux disease without esophagitis: Secondary | ICD-10-CM | POA: Insufficient documentation

## 2017-08-13 DIAGNOSIS — Z79899 Other long term (current) drug therapy: Secondary | ICD-10-CM | POA: Diagnosis not present

## 2017-08-13 DIAGNOSIS — L719 Rosacea, unspecified: Secondary | ICD-10-CM | POA: Insufficient documentation

## 2017-08-13 DIAGNOSIS — E7800 Pure hypercholesterolemia, unspecified: Secondary | ICD-10-CM | POA: Insufficient documentation

## 2017-08-13 DIAGNOSIS — K589 Irritable bowel syndrome without diarrhea: Secondary | ICD-10-CM | POA: Insufficient documentation

## 2017-08-13 DIAGNOSIS — N62 Hypertrophy of breast: Secondary | ICD-10-CM

## 2017-08-13 DIAGNOSIS — Z7982 Long term (current) use of aspirin: Secondary | ICD-10-CM | POA: Insufficient documentation

## 2017-08-13 DIAGNOSIS — N320 Bladder-neck obstruction: Secondary | ICD-10-CM | POA: Insufficient documentation

## 2017-08-13 DIAGNOSIS — Z85828 Personal history of other malignant neoplasm of skin: Secondary | ICD-10-CM | POA: Diagnosis not present

## 2017-08-13 HISTORY — DX: Hypertrophy of breast: N62

## 2017-08-13 NOTE — Progress Notes (Signed)
[place]         [phone] ________________________________  Initial outpatient Consultation  Name: Tanner [name] MRN: [id number]  Date: 08/13/2017  DOB: 1946-03-20  REFERRING PHYSICIAN: Alexis Frock, MD  DIAGNOSIS:  72 y.o. gentleman with gynecomastia secondary to 5 - alpha reductase inhibitor.    ICD-10-CM   1. Gynecomastia N62     HISTORY OF PRESENT ILLNESS::Tanner [name] is a 72 y.o. male who is here today for consideration of low dose radiation therapy for symptom management of breast tenderness associated with medically induced gynecomastia. He has a longstanding history of BPH with BOO leading to urinary retention and decreased contractility of the bladder. He has been taking Finasteride for several years to manage his BPH/BOO and has benefited tremendously from this medication. He does not tolerate alpha blockers and refuses surgical procedures. Over the past 3-4 months, he has noted progressive breast enlargement bilaterally, but has pain and tenderness on the right side only. He has also noted a palpable lump in the right breast which was evaluated further with mammography andrevealed fibroglandular tissue only with no suspicious masses or calcifications- consistent with moderate to severe right sided and mild left sided gynecomastia.  He presents today to discuss the potential option of low dose radiotherapy in the management of his condition.  PREVIOUS RADIATION THERAPY: No  Past Medical History:  Diagnosis Date  . ADD (attention deficit disorder)   . Anxiety   . Arm thromboembolism, superficial, acute    from IV site, left arm  . Arthritis    "thumbs; right knee" (05/01/2016)  . Basal cell carcinoma    left ear; right shoulder; chest; top of head"  . BPH (benign prostatic hypertrophy)   . Depression   . Esophageal reflux   . Gynecomastia   . High cholesterol   . IBS (irritable bowel syndrome)   . Rosacea   . Sleep apnea    central  sleep apnea  :   Past Surgical History:  Procedure Laterality Date  . BASAL CELL CARCINOMA EXCISION     left ear; right shoulder; top of head"  . BUNIONECTOMY Left   . CATARACT EXTRACTION W/ INTRAOCULAR LENS  IMPLANT, BILATERAL Bilateral   . CLAVICLE SURGERY Left 1980s   "cut an inch or so off it"  . CLOSED REDUCTION HAND FRACTURE Left ~ 1995   "later took the screws out w/pliers"  . COLONOSCOPY  10 years ago   pt does not know where/when  . ELBOW SURGERY Right    "had a tear; sewed it back onto the bone"  . HERNIA REPAIR    . INGUINAL HERNIA REPAIR Left 2001  . JOINT REPLACEMENT    . KNEE ARTHROSCOPY Right   . MOUTH SURGERY     teeth removal/implants placed  . NEUROMA SURGERY Left    foot  . PLANTAR FASCIA RELEASE Right   . SHOULDER ARTHROSCOPY W/ ROTATOR CUFF REPAIR Right   . TONSILLECTOMY    . TOTAL HIP ARTHROPLASTY Right 05/11/2013   Procedure: RIGHT TOTAL HIP ARTHROPLASTY ANTERIOR APPROACH;  Surgeon: Gearlean Alf, MD;  Location: [facility];  Service: Orthopedics;  Laterality: Right;  WOUND CLASSIFICATION-CLEAN  . TOTAL HIP ARTHROPLASTY Left 09/18/2014   Procedure: LEFT TOTAL HIP ARTHROPLASTY ANTERIOR APPROACH;  Surgeon: Gaynelle Arabian, MD;  Location: [facility];  Service: Orthopedics;  Laterality: Left;  . TOTAL SHOULDER ARTHROPLASTY Left 05/01/2016  . TOTAL SHOULDER ARTHROPLASTY Left 05/01/2016   Procedure: TOTAL SHOULDER ARTHROPLASTY;  Surgeon:  Justice Britain, MD;  Location: [facility];  Service: Orthopedics;  Laterality: Left;  . UMBILICAL HERNIA REPAIR  03/01/2013  :   Current Outpatient Medications:  .  Aloe-Sodium Chloride (AYR SALINE NASAL GEL NA), Place 1 application into the nose 2 (two) times daily as needed (dryness)., Disp: , Rfl:  .  amphetamine-dextroamphetamine (ADDERALL XR) 20 MG 24 hr capsule, Take 20 mg by mouth 2 (two) times daily. Take 1 in the morning & one in the evening., Disp: , Rfl:  .  aspirin 81 MG tablet, Take 81 mg by mouth at bedtime. , Disp: , Rfl:  .   Biotin 1000 MCG tablet, Take 1,000 mcg by mouth daily., Disp: , Rfl:  .  Carboxymethylcellulose Sodium (REFRESH TEARS OP), Apply 1 drop to eye 3 (three) times daily as needed (dry eyes)., Disp: , Rfl:  .  clotrimazole-betamethasone (LOTRISONE) cream, Apply topically., Disp: , Rfl:  .  finasteride (PROSCAR) 5 MG tablet, Take 5 mg by mouth daily., Disp: , Rfl:  .  fluticasone (FLONASE) 50 MCG/ACT nasal spray, Place 2 sprays into both nostrils daily., Disp: , Rfl:  .  ibuprofen (ADVIL,MOTRIN) 200 MG tablet, Take 200-400 mg by mouth every 6 (six) hours as needed for headache or moderate pain. , Disp: , Rfl:  .  ketoconazole (NIZORAL) 2 % cream, Apply 1 application topically 3 (three) times a week., Disp: , Rfl:  .  Multiple Vitamin (MULTIVITAMIN) capsule, Take by mouth., Disp: , Rfl:  .  omeprazole (PRILOSEC) 40 MG capsule, Take by mouth., Disp: , Rfl:  .  permethrin (ELIMITE) 5 % cream, Apply 1 application topically 2 (two) times daily., Disp: , Rfl:  .  sildenafil (REVATIO) 20 MG tablet, Take 20-100 mg by mouth as needed (ED). , Disp: , Rfl:  .  simvastatin (ZOCOR) 20 MG tablet, Take 20 mg by mouth every evening., Disp: , Rfl:  .  sodium chloride (OCEAN) 0.65 % SOLN nasal spray, Place 1 spray into both nostrils 2 (two) times daily as needed (dryness)., Disp: , Rfl:  .  Vilazodone HCl (VIIBRYD) 20 MG TABS, TAKE 1 TABLET BY MOUTH EVERY DAY WITH A FULL MEAL, Disp: , Rfl: :   Allergies  Allergen Reactions  . Valium [Diazepam] Anxiety    Becomes irrational and irritable  :   Family History  Problem Relation Age of Onset  . Heart attack Father 79  . Heart disease Father   . Lung disease Mother   . Colon cancer Paternal Grandmother        age 73-70's  . Diabetes Brother   . Other Neg Hx        gynecomastia  :   Social History   Socioeconomic History  . Marital status: Widowed    Spouse name: Not on file  . Number of children: 2  . Years of education: Not on file  . Highest  education level: Not on file  Social Needs  . Financial resource strain: Not on file  . Food insecurity - worry: Not on file  . Food insecurity - inability: Not on file  . Transportation needs - medical: Not on file  . Transportation needs - non-medical: Not on file  Occupational History  . Occupation: retired  Tobacco Use  . Smoking status: Never Smoker  . Smokeless tobacco: Never Used  Substance and Sexual Activity  . Alcohol use: Yes    Alcohol/week: 4.8 oz    Types: 7 Cans of beer, 1 Shots of liquor per  week  . Drug use: No  . Sexual activity: Yes  Other Topics Concern  . Not on file  Social History Narrative   Resides in [place]  :  REVIEW OF SYSTEMS:  On review of systems, the patient reports that he is doing well overall. His only complaint is of progressive breast enlargement as noted above in the HPI.  He has moderate tenderness/pain in the right breast and has noted a palpable lump on the right.  No tenderness or masses noted on the left.  The breast enlargement is quite bothersome to him and is limiting him from continuing to swim for exercise as he is quite conscientious of the appearance of his breast enlargement. He denies any chest pain, shortness of breath, cough, fevers, chills, night sweats, unintended weight changes. He denies any bowel or bladder disturbances, and denies abdominal pain, nausea or vomiting. He denies any new musculoskeletal or joint aches or pains, new skin lesions or concerns. A complete review of systems is obtained and is otherwise negative.   PHYSICAL EXAM:  Blood pressure 124/73, pulse (!) 57, resp. rate 18, height 5' 10.5" (1.791 m), weight 182 lb 3.2 oz (82.6 kg), SpO2 100 %. . In general this is a well appearing caucasian male in no acute distress. He is alert and oriented x4 and appropriate throughout the examination. HEENT reveals that the patient is normocephalic, atraumatic. EOMs are intact. PERRLA. Skin is intact without any evidence of  gross lesions. There is symmetrical enlargement in both breasts with tenderness to palpation on the right.  No suspicious palpable masses are noted bilaterally and there is no nipple discharge or bleeding bilaterally. Cardiovascular exam reveals a regular rate and rhythm, no clicks rubs or murmurs are auscultated. Chest is clear to auscultation bilaterally. Lymphatic assessment is performed and does not reveal any adenopathy in the cervical, supraclavicular, axillary, or inguinal chains. Abdomen has active bowel sounds in all quadrants and is intact. The abdomen is soft, non tender, non distended. Lower extremities are negative for pretibial pitting edema, deep calf tenderness, cyanosis or clubbing.   KPS = 100  100 - Normal; no complaints; no evidence of disease. 90   - Able to carry on normal activity; minor signs or symptoms of disease. 80   - Normal activity with effort; some signs or symptoms of disease. 4   - Cares for self; unable to carry on normal activity or to do active work. 60   - Requires occasional assistance, but is able to care for most of his personal needs. 50   - Requires considerable assistance and frequent medical care. 56   - Disabled; requires special care and assistance. 39   - Severely disabled; hospital admission is indicated although death not imminent. 53   - Very sick; hospital admission necessary; active supportive treatment necessary. 10   - Moribund; fatal processes progressing rapidly. 0     - Dead  Karnofsky DA, Abelmann [place], Craver LS and [facility] [phone]) The use of the nitrogen mustards in the palliative treatment of carcinoma: with particular reference to bronchogenic carcinoma Cancer 1 634-56  LABORATORY DATA:  Lab Results  Component Value Date   WBC 6.9 04/18/2016   HGB 15.2 04/18/2016   HCT 45.4 04/18/2016   MCV 96.6 04/18/2016   PLT 204 04/18/2016   Lab Results  Component Value Date   NA 141 04/18/2016   K 4.8 04/18/2016   CL 108 04/18/2016    CO2 26 04/18/2016   Lab  Results  Component Value Date   ALT 23 05/03/2013   AST 38 (H) 05/03/2013   ALKPHOS 54 05/03/2013   BILITOT 0.9 05/03/2013     RADIOGRAPHY: No results found.    IMPRESSION/ PLAN: Tanner [name] is a 72 y.o. caucasian male with medically induced gynecomastia secondary to 5 - alpha reductase inhibitor. Today, we talked to the patient about the findings and work-up thus far.  We discussed the natural history of gynecomastia and general treatment, highlighting the role of radiotherapy in the management.  We discussed the available radiation techniques, and focused on the details of logistics and delivery.  The recommendation is for a course of 3 daily radiotherapy treatments to bilateral breasts to prevent further tissue growth and alleviate associated pain.  We discussed the fact that while radiotherapy is beneficial for limiting further breast enlargement and alleviating pain, it is NOT likely to cause any substantial reduction in the size of the already enlarged breast tissue.  We reviewed the anticipated acute and late sequelae associated with radiation in this setting.  The patient was encouraged to ask questions that were answered to the best of our ability.   At the conclusion of our conversation, the patient reports that he is leaning towards  proceeding with radiotherapy but that he would like some additional time to think things over and consider his options.  He will inform us of his final decision on Tuesday, 08/18/17 and we will plan to proceed accordingly.   We spent 60 minutes minutes face to face with the patient and more than 50% of that time was spent in counseling and/or coordination of care.   -----------------------------------------------   Nicholos Johns, PA-C    Tyler Pita, MD  [place]: [phone]  Fax: [phone] [email or web address]  Skype  [email or web address]    Page Me     This document serves  as a record of services personally performed by Tyler Pita, MD and Ashlyn Bruning PA-C. It was created on their behalf by Delton Coombes, a trained medical scribe. The creation of this record is based on the scribe's personal observations and the provider's statements to them.

## 2017-08-17 ENCOUNTER — Encounter: Payer: Self-pay | Admitting: Radiation Oncology

## 2017-08-17 NOTE — Progress Notes (Signed)
Pt called wanting to proceed with the next step. I had CT [facility] looking him up, but pt hung up. CT [facility] said that they will call him.

## 2017-08-18 ENCOUNTER — Telehealth: Payer: Self-pay | Admitting: Urology

## 2017-08-18 ENCOUNTER — Other Ambulatory Visit: Payer: Self-pay | Admitting: Dermatology

## 2017-08-18 NOTE — Telephone Encounter (Signed)
LMOVM requesting a return call to discuss final decision for radiotherapy to prevent further gynecomastia.

## 2017-08-20 ENCOUNTER — Telehealth: Payer: Self-pay | Admitting: Urology

## 2017-08-20 NOTE — Telephone Encounter (Signed)
Patient returned my call and states that he is ready to move forward with radiotherapy to bilateral breasts to treat his medically induced gynecomastia.  I advised that I will pass this information along to the [facility] team to reach out and get him on the scheduled for planning next week.  He is in agreement.   Nicholos Johns, PA-C

## 2017-08-20 NOTE — Telephone Encounter (Signed)
Attempted to return patient's call but had to [facility] requesting he call back with his treatment decision. Advised ok to leave a message for me indicating his preference if I am not immediately available.  If he has specific questions regarding treatment, I am happy to answer those as well.   Nicholos Johns, PA-C

## 2017-08-20 NOTE — Telephone Encounter (Signed)
-----   Message from Kerri Perches sent at 08/19/2017  2:50 PM EDT ----- Regarding: PHONE CALL Hi Margarie Mcguirt,   The above patient returned your call.  Mr. Rise phone number is [phone].   Thanks,  [employer]

## 2017-08-27 ENCOUNTER — Ambulatory Visit: Payer: Medicare Other

## 2017-08-27 ENCOUNTER — Ambulatory Visit: Payer: Medicare Other | Admitting: Radiation Oncology

## 2017-09-07 ENCOUNTER — Ambulatory Visit
Admission: RE | Admit: 2017-09-07 | Discharge: 2017-09-07 | Disposition: A | Discharge status: Home / Self Care | Payer: Medicare Other | Source: Ambulatory Visit | Attending: Radiation Oncology | Admitting: Radiation Oncology

## 2017-09-07 DIAGNOSIS — Z510 Encounter for antineoplastic radiation therapy: Secondary | ICD-10-CM | POA: Insufficient documentation

## 2017-09-07 DIAGNOSIS — C61 Malignant neoplasm of prostate: Secondary | ICD-10-CM

## 2017-09-07 DIAGNOSIS — N62 Hypertrophy of breast: Secondary | ICD-10-CM

## 2017-09-07 NOTE — Progress Notes (Signed)
  Radiation Oncology         [phone] ________________________________  Name: Tanner [name] MRN: [id number]  Date: 09/07/2017  DOB: Dec 11, 1945  SIMULATION AND TREATMENT PLANNING NOTE    ICD-10-CM   1. Gynecomastia N62   2. Malignant neoplasm of prostate [facility]) C61     DIAGNOSIS:  72 yo man with gynecomastia from hormone therapy for prostate cancer  NARRATIVE:  The patient was brought to the [facility].  Identity was confirmed.  All relevant records and images related to the planned course of therapy were reviewed.  The patient freely provided informed written consent to proceed with treatment after reviewing the details related to the planned course of therapy. The consent form was witnessed and verified by the simulation staff.  Then, the patient was set-up in a stable reproducible  supine position for radiation therapy.  CT images were obtained.  Surface markings were placed.  The CT images were loaded into the planning software.  Then the target and avoidance structures were contoured.  Treatment planning then occurred.  The radiation prescription was entered and confirmed.  Then, I designed and supervised the construction of a total of 2 medically necessary complex treatment devices.  I have requested : Isodose Plan.   PLAN:  The patient will receive 12 Gy in 3 fractions to bilateral breasts.  ________________________________  Sheral Apley Tammi Klippel, M.D.

## 2017-09-08 DIAGNOSIS — C61 Malignant neoplasm of prostate: Secondary | ICD-10-CM | POA: Diagnosis not present

## 2017-09-09 ENCOUNTER — Ambulatory Visit
Admission: RE | Admit: 2017-09-09 | Discharge: 2017-09-09 | Disposition: A | Discharge status: Home / Self Care | Payer: Medicare Other | Source: Ambulatory Visit | Attending: Radiation Oncology | Admitting: Radiation Oncology

## 2017-09-09 DIAGNOSIS — C61 Malignant neoplasm of prostate: Secondary | ICD-10-CM | POA: Diagnosis not present

## 2017-09-10 ENCOUNTER — Ambulatory Visit
Admission: RE | Admit: 2017-09-10 | Discharge: 2017-09-10 | Disposition: A | Discharge status: Home / Self Care | Payer: Medicare Other | Source: Ambulatory Visit | Attending: Radiation Oncology | Admitting: Radiation Oncology

## 2017-09-10 DIAGNOSIS — C61 Malignant neoplasm of prostate: Secondary | ICD-10-CM | POA: Diagnosis not present

## 2017-09-11 ENCOUNTER — Ambulatory Visit
Admission: RE | Admit: 2017-09-11 | Discharge: 2017-09-11 | Disposition: A | Discharge status: Home / Self Care | Payer: Medicare Other | Source: Ambulatory Visit | Attending: Radiation Oncology | Admitting: Radiation Oncology

## 2017-09-11 ENCOUNTER — Encounter: Payer: Self-pay | Admitting: Radiation Oncology

## 2017-09-11 DIAGNOSIS — C61 Malignant neoplasm of prostate: Secondary | ICD-10-CM | POA: Diagnosis not present

## 2017-09-14 NOTE — Progress Notes (Signed)
  Radiation Oncology         [phone] ________________________________  Name: Tanner [name] MRN: [id number]  Date: 09/11/2017  DOB: [birth year]  End of Treatment Note  Diagnosis:   72 y.o. male with medically induced gynecomastia secondary to use of 5-alpha reductase inhibitors for management of BPH with BOO  Indication for treatment:  Curative       Radiation treatment dates:   09/09/2017 - 09/11/2017  Site/dose:   Bilateral Breasts / 12 Gy in 3 fractions  Beams/energy:   En Face / 9E Electron  Narrative: The patient tolerated radiation treatment relatively well.   He denied any fatigue or pain.  He did not experience any significant skin irritation.  Plan: The patient has completed radiation treatment. The patient will return to radiation oncology clinic for routine followup in one month. I advised him to call or return sooner if he has any questions or concerns related to his recovery or treatment. ________________________________  Sheral Apley. Tammi Klippel, M.D.  This document serves as a record of services personally performed by Tyler Pita, MD. It was created on his behalf by Rae Lips, a trained medical scribe. The creation of this record is based on the scribe's personal observations and the provider's statements to them. This document has been checked and approved by the attending provider.

## 2017-10-13 ENCOUNTER — Telehealth: Payer: Self-pay | Admitting: *Deleted

## 2017-10-13 NOTE — Telephone Encounter (Signed)
Called patient to ask about chaning his appt. time on 10-14-17, patient agreed to 11 am on 10-14-17.

## 2017-10-14 ENCOUNTER — Ambulatory Visit
Admission: RE | Admit: 2017-10-14 | Discharge: 2017-10-14 | Disposition: A | Discharge status: Home / Self Care | Payer: Medicare Other | Source: Ambulatory Visit | Attending: Urology | Admitting: Urology

## 2017-10-14 ENCOUNTER — Encounter: Payer: Self-pay | Admitting: Urology

## 2017-10-14 VITALS — BP 120/72 | HR 60 | Temp 97.9°F | Resp 18 | Ht 70.0 in | Wt 180.4 lb

## 2017-10-14 DIAGNOSIS — C61 Malignant neoplasm of prostate: Secondary | ICD-10-CM | POA: Insufficient documentation

## 2017-10-14 DIAGNOSIS — Z923 Personal history of irradiation: Secondary | ICD-10-CM | POA: Insufficient documentation

## 2017-10-14 DIAGNOSIS — N62 Hypertrophy of breast: Secondary | ICD-10-CM | POA: Diagnosis not present

## 2017-10-14 NOTE — Progress Notes (Signed)
Radiation Oncology         [phone] ________________________________  Name: Tanner [name] MRN: [id number]  Date: 10/14/2017  DOB: 02/06/1946  Post Treatment Note  CC: Lujean Amel, MD  Alexis Frock, MD  Diagnosis:   72 y.o. male with medically induced gynecomastia secondary to use of 5-alpha reductase inhibitors for management of BPH with BOO  Interval Since Last Radiation:  4 weeks  09/09/2017 - 09/11/2017:   Bilateral Breasts / 12 Gy in 3 fractions  Narrative:  The patient returns today for routine follow-up.  He tolerated radiation treatment relatively well.   He denied any fatigue or pain.  He did not experience any significant skin irritation.                            On review of systems, the patient states that he is doing very well overall.  Initially, he had complete resolution of breast tenderness during and immediately following his radiation treatments.  However, over the past 2 weeks he has noticed some mild tenderness in the left breast with palpation.  This is tolerable and not really bothersome.  He has continued taking finasteride daily as prescribed.  He reports mild residual itching and dry skin in the treatment field but otherwise is quite pleased with his progress to date.  He anticipates a follow-up appointment with urology in July 2019.  ALLERGIES:  is allergic to valium [diazepam].  Meds: Current Outpatient Medications  Medication Sig Dispense Refill  . amphetamine-dextroamphetamine (ADDERALL XR) 20 MG 24 hr capsule Take 20 mg by mouth 2 (two) times daily. Take 1 in the morning & one in the evening.    . Biotin 1000 MCG tablet Take 1,000 mcg by mouth daily.    . Carboxymethylcellulose Sodium (REFRESH TEARS OP) Apply 1 drop to eye 3 (three) times daily as needed (dry eyes).    . clotrimazole-betamethasone (LOTRISONE) cream Apply topically.    . finasteride (PROSCAR) 5 MG tablet Take 5 mg by mouth daily.    . fluticasone (FLONASE) 50 MCG/ACT nasal  spray Place 2 sprays into both nostrils daily.    Marland Kitchen ibuprofen (ADVIL,MOTRIN) 200 MG tablet Take 200-400 mg by mouth every 6 (six) hours as needed for headache or moderate pain.     Marland Kitchen ivermectin (STROMECTOL) 3 MG TABS tablet Take 5 tablets PO all at once. Repeat in 7 days.    Marland Kitchen ketoconazole (NIZORAL) 2 % cream Apply 1 application topically 3 (three) times a week.    . Multiple Vitamin (MULTIVITAMIN) capsule Take by mouth.    Marland Kitchen omeprazole (PRILOSEC) 40 MG capsule Take by mouth.    . permethrin (ELIMITE) 5 % cream Apply 1 application topically 2 (two) times daily.    . sildenafil (REVATIO) 20 MG tablet Take 20-100 mg by mouth as needed (ED).     . simvastatin (ZOCOR) 20 MG tablet Take 20 mg by mouth every evening.    . sodium chloride (OCEAN) 0.65 % SOLN nasal spray Place 1 spray into both nostrils 2 (two) times daily as needed (dryness).    . Vilazodone HCl (VIIBRYD) 20 MG TABS TAKE 1 TABLET BY MOUTH EVERY DAY WITH A FULL MEAL    . Aloe-Sodium Chloride (AYR SALINE NASAL GEL NA) Place 1 application into the nose 2 (two) times daily as needed (dryness).    Marland Kitchen amoxicillin (AMOXIL) 500 MG capsule TAKE 4 CAPS BY MOUTH 1 HOUR PRIOR TO APPT  0  . aspirin 81 MG tablet Take 81 mg by mouth at bedtime.     . SOOLANTRA 1 % CREA APPLY 1 APPLICATION TOPICALLY ONCE DAILY  6   No current facility-administered medications for this encounter.     Physical Findings:  height is 5\' 10"  (1.778 m) and weight is 180 lb 6.4 oz (81.8 kg). His temperature is 97.9 F (36.6 C). His blood pressure is 120/72 and his pulse is 60. His respiration is 18 and oxygen saturation is 100%.  Pain Assessment Pain Score: 0-No pain/10 In general this is a well appearing Caucasian male in no acute distress.  He's alert and oriented x4 and appropriate throughout the examination. Cardiopulmonary assessment is negative for acute distress and he exhibits normal effort.  There is mild residual erythema/hyperpigmentation in the treatment field  without desquamation.  The right breast is nontender to palpation and there are no palpable abnormalities.  There are no palpable abnormalities in the left breast but there is mild tenderness to palpation. No nipple discharge or bleeding bilaterally.  Lab Findings: Lab Results  Component Value Date   WBC 6.9 04/18/2016   HGB 15.2 04/18/2016   HCT 45.4 04/18/2016   MCV 96.6 04/18/2016   PLT 204 04/18/2016     Radiographic Findings: No results found.  Impression/Plan: 41. 72 y.o. male with medically induced gynecomastia secondary to use of 5-alpha reductase inhibitors for management of BPH with BOO. He has completed his course of radiotherapy and appears to have recovered well. We discussed that while we are happy to continue to participate in his care, at this point, we will plan to see him back on an as-needed basis.  He anticipates a follow-up visit with Dr. Tresa Moore in July 2019 but does not currently have a scheduled visit.  He has mild residual tenderness in the left breast which we discussed may continue long-term, as long as he is taking finasteride.  He knows to call at anytime with any questions or concerns related to his previous radiotherapy.    Nicholos Johns, PA-C

## 2018-01-25 ENCOUNTER — Other Ambulatory Visit: Payer: Self-pay | Admitting: Dermatology

## 2018-03-29 ENCOUNTER — Other Ambulatory Visit: Payer: Self-pay | Admitting: Family Medicine

## 2018-03-29 ENCOUNTER — Ambulatory Visit
Admission: RE | Admit: 2018-03-29 | Discharge: 2018-03-29 | Disposition: A | Discharge status: Home / Self Care | Payer: Medicare Other | Source: Ambulatory Visit | Attending: Family Medicine | Admitting: Family Medicine

## 2018-03-29 DIAGNOSIS — R109 Unspecified abdominal pain: Secondary | ICD-10-CM

## 2018-03-29 MED ORDER — IOPAMIDOL (ISOVUE-300) INJECTION 61%
100.0000 mL | Freq: Once | INTRAVENOUS | Status: AC | PRN
  Administered 2018-03-29: 100 mL via INTRAVENOUS

## 2018-05-11 ENCOUNTER — Other Ambulatory Visit: Payer: Self-pay | Admitting: Dermatology

## 2018-12-24 ENCOUNTER — Other Ambulatory Visit: Payer: Self-pay

## 2018-12-24 DIAGNOSIS — M65341 Trigger finger, right ring finger: Secondary | ICD-10-CM

## 2019-01-13 ENCOUNTER — Ambulatory Visit (INDEPENDENT_AMBULATORY_CARE_PROVIDER_SITE_OTHER): Payer: Medicare Other | Admitting: Neurology

## 2019-01-13 ENCOUNTER — Other Ambulatory Visit: Payer: Self-pay

## 2019-01-13 DIAGNOSIS — M65341 Trigger finger, right ring finger: Secondary | ICD-10-CM | POA: Diagnosis not present

## 2019-01-13 DIAGNOSIS — G5601 Carpal tunnel syndrome, right upper limb: Secondary | ICD-10-CM

## 2019-01-13 DIAGNOSIS — M5412 Radiculopathy, cervical region: Secondary | ICD-10-CM

## 2019-01-13 NOTE — Procedures (Signed)
[facility] Neurology  [place], [address]  [place], [place] [address] Tel: [phone] Fax:  [phone] Test Date:  01/13/2019  Patient: Tanner [name] DOB: 1946-02-05 Physician: Narda Amber, DO  Sex: Male Height: 5\' 11"  Ref Phys: Charlotte Crumb, MD  ID#: [id number] Temp: 34.0C Technician:    Patient Complaints: This is a 73 year old man referred for evaluation of right arm tingling radiating into the hand.  NCV & EMG Findings: Extensive electrodiagnostic testing of the right upper extremity and additional studies of the left shows:  1. Right median sensory response shows prolonged distal peak latency (4.7 ms) and reduced amplitude (9.5 V).  Left mixed palmar sensory responses show prolonged latency.  Left median and bilateral ulnar sensory responses are within normal limits. 2. Right median motor response shows prolonged distal onset latency (5.2 ms) and reduced amplitude (4.7 mV).  Left median and bilateral ulnar motor responses are within normal limits.   3. 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  There is no evidence of accompanied active denervation.  Impression: 1. 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   2. 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   ___________________________ Narda Amber, DO    Nerve Conduction Studies Anti Sensory Summary Table   Site NR Peak (ms) Norm Peak (ms) P-T Amp (V) Norm P-T Amp  Left Median Anti Sensory (2nd Digit)  34C  Wrist    3.8 <3.8 13.1 >10  Right Median Anti Sensory (2nd Digit)  34C  Wrist    4.7 <3.8 9.5 >10  Left Ulnar Anti Sensory (5th Digit)  34C  Wrist    3.2 <3.2 10.9 >5  Right Ulnar Anti Sensory (5th Digit)  34C  Wrist    2.8 <3.2 11.1 >5   Motor Summary Table   Site NR Onset (ms) Norm Onset (ms) O-P  Amp (mV) Norm O-P Amp Site1 Site2 Delta-0 (ms) Dist (cm) Vel (m/s) Norm Vel (m/s)  Left Median Motor (Abd Poll Brev)  34C  Wrist    3.9 <4.0 6.3 >5 Elbow Wrist 6.0 30.0 50 >50  Elbow    9.9  5.7         Right Median Motor (Abd Poll Brev)  34C  Wrist    5.2 <4.0 4.7 >5 Elbow Wrist 5.5 30.0 55 >50  Elbow    10.7  4.1         Left Ulnar Motor (Abd Dig Minimi)  34C  Wrist    2.8 <3.1 9.0 >7 B Elbow Wrist 4.4 24.0 55 >50  B Elbow    7.2  9.0  A Elbow B Elbow 2.0 10.0 50 >50  A Elbow    9.2  8.8         Right Ulnar Motor (Abd Dig Minimi)  34C  Wrist    2.2 <3.1 8.6 >7 B Elbow Wrist 4.5 24.0 53 >50  B Elbow    6.7  8.2  A Elbow B Elbow 2.0 10.0 50 >50  A Elbow    8.7  7.9          Comparison Summary Table   Site NR Peak (ms) Norm Peak (ms) P-T Amp (V) Site1 Site2 Delta-P (ms) Norm Delta (ms)  Left Median/Ulnar Palm Comparison (Wrist - 8cm)  34C  Median Palm    2.3 <2.2 24.2 Median Palm Ulnar  Palm 0.7   Ulnar Palm    1.6 <2.2 11.0       EMG   Side Muscle Ins Act Fibs Psw Fasc Number Recrt Dur Dur. Amp Amp. Poly Poly. Comment  Right 1stDorInt Nml Nml Nml Nml 1- Rapid Some 1+ Some 1+ Some 1+ N/A  Right Abd Poll Brev Nml Nml Nml Nml 2- Rapid Some 1+ Some 1+ Some 1+ N/A  Right Ext Indicis Nml Nml Nml Nml 1- Rapid Some 1+ Some 1+ Some 1+ N/A  Right PronatorTeres Nml Nml Nml Nml 1- Rapid Some 1+ Some 1+ Nml Nml N/A  Right Biceps Nml Nml Nml Nml Nml Nml Nml Nml Nml Nml Nml Nml N/A  Right Triceps Nml Nml Nml Nml 1- Rapid Some 1+ Some 1+ Some 1+ N/A  Right Deltoid Nml Nml Nml Nml Nml Nml Nml Nml Nml Nml Nml Nml N/A  Left 1stDorInt Nml Nml Nml Nml 1- Rapid Some 1+ Some 1+ Some 1+ N/A  Left Abd Poll Brev Nml Nml Nml Nml Nml Nml Nml Nml Nml Nml Nml Nml N/A  Left Ext Indicis Nml Nml Nml Nml 1- Rapid Some 1+ Some 1+ Some 1+ N/A  Left PronatorTeres Nml Nml Nml Nml 1- Rapid Some 1+ Some 1+ Some 1+ N/A  Left Biceps Nml Nml Nml Nml Nml Nml Nml Nml Nml Nml Nml Nml N/A  Left Deltoid Nml Nml Nml Nml  Nml Nml Nml Nml Nml Nml Nml Nml N/A  Left Triceps Nml Nml Nml Nml 1- Rapid Some 1+ Some 1+ Some 1+ N/A      Waveforms:

## 2019-02-22 ENCOUNTER — Other Ambulatory Visit: Payer: Self-pay | Admitting: Dermatology

## 2019-03-17 ENCOUNTER — Other Ambulatory Visit: Payer: Self-pay | Admitting: Dermatology

## 2019-04-18 ENCOUNTER — Other Ambulatory Visit: Payer: Self-pay | Admitting: Family Medicine

## 2019-04-18 ENCOUNTER — Ambulatory Visit
Admission: RE | Admit: 2019-04-18 | Discharge: 2019-04-18 | Disposition: A | Discharge status: Home / Self Care | Payer: Medicare Other | Source: Ambulatory Visit | Attending: Family Medicine | Admitting: Family Medicine

## 2019-04-18 DIAGNOSIS — R61 Generalized hyperhidrosis: Secondary | ICD-10-CM

## 2019-04-21 ENCOUNTER — Other Ambulatory Visit: Payer: Self-pay | Admitting: Dermatology

## 2019-05-04 ENCOUNTER — Encounter (HOSPITAL_COMMUNITY): Payer: Self-pay | Admitting: Emergency Medicine

## 2019-05-04 ENCOUNTER — Emergency Department (HOSPITAL_COMMUNITY): Payer: Medicare Other

## 2019-05-04 ENCOUNTER — Emergency Department (HOSPITAL_COMMUNITY)
Admission: EM | Admit: 2019-05-04 | Discharge: 2019-05-04 | Disposition: A | Discharge status: Home / Self Care | Payer: Medicare Other | Attending: Emergency Medicine | Admitting: Emergency Medicine

## 2019-05-04 ENCOUNTER — Other Ambulatory Visit: Payer: Self-pay

## 2019-05-04 DIAGNOSIS — S161XXA Strain of muscle, fascia and tendon at neck level, initial encounter: Secondary | ICD-10-CM | POA: Diagnosis not present

## 2019-05-04 DIAGNOSIS — S060X9A Concussion with loss of consciousness of unspecified duration, initial encounter: Secondary | ICD-10-CM

## 2019-05-04 DIAGNOSIS — W11XXXA Fall on and from ladder, initial encounter: Secondary | ICD-10-CM | POA: Diagnosis not present

## 2019-05-04 DIAGNOSIS — R1032 Left lower quadrant pain: Secondary | ICD-10-CM | POA: Insufficient documentation

## 2019-05-04 DIAGNOSIS — T07XXXA Unspecified multiple injuries, initial encounter: Secondary | ICD-10-CM | POA: Diagnosis present

## 2019-05-04 DIAGNOSIS — W19XXXA Unspecified fall, initial encounter: Secondary | ICD-10-CM

## 2019-05-04 DIAGNOSIS — S060X0A Concussion without loss of consciousness, initial encounter: Secondary | ICD-10-CM | POA: Diagnosis not present

## 2019-05-04 DIAGNOSIS — Y929 Unspecified place or not applicable: Secondary | ICD-10-CM | POA: Diagnosis not present

## 2019-05-04 DIAGNOSIS — Y939 Activity, unspecified: Secondary | ICD-10-CM | POA: Diagnosis not present

## 2019-05-04 DIAGNOSIS — S0001XA Abrasion of scalp, initial encounter: Secondary | ICD-10-CM | POA: Insufficient documentation

## 2019-05-04 DIAGNOSIS — Y999 Unspecified external cause status: Secondary | ICD-10-CM | POA: Diagnosis not present

## 2019-05-04 DIAGNOSIS — S2232XA Fracture of one rib, left side, initial encounter for closed fracture: Secondary | ICD-10-CM | POA: Insufficient documentation

## 2019-05-04 LAB — COMPREHENSIVE METABOLIC PANEL
ALT: 27 U/L (ref 0–44)
AST: 46 U/L — ABNORMAL HIGH (ref 15–41)
Albumin: 3.7 g/dL (ref 3.5–5.0)
Alkaline Phosphatase: 48 U/L (ref 38–126)
Anion gap: 10 (ref 5–15)
BUN: 15 mg/dL (ref 8–23)
CO2: 25 mmol/L (ref 22–32)
Calcium: 9.5 mg/dL (ref 8.9–10.3)
Chloride: 106 mmol/L (ref 98–111)
Creatinine, Ser: 1.26 mg/dL — ABNORMAL HIGH (ref 0.61–1.24)
GFR calc Af Amer: >60 mL/min (ref >60)
GFR calc non Af Amer: 56 mL/min — ABNORMAL LOW (ref >60)
Glucose, Bld: 99 mg/dL (ref 70–99)
Potassium: 4.8 mmol/L (ref 3.5–5.1)
Sodium: 141 mmol/L (ref 135–145)
Total Bilirubin: 1.2 mg/dL (ref 0.3–1.2)
Total Protein: 6.2 g/dL — ABNORMAL LOW (ref 6.5–8.1)

## 2019-05-04 LAB — I-STAT CHEM 8, ED
BUN: 17 mg/dL (ref 8–23)
Calcium, Ion: 1.15 mmol/L (ref 1.15–1.40)
Chloride: 107 mmol/L (ref 98–111)
Creatinine, Ser: 1.3 mg/dL — ABNORMAL HIGH (ref 0.61–1.24)
Glucose, Bld: 94 mg/dL (ref 70–99)
HCT: 47 % (ref 39.0–52.0)
Hemoglobin: 16 g/dL (ref 13.0–17.0)
Potassium: 4.7 mmol/L (ref 3.5–5.1)
Sodium: 140 mmol/L (ref 135–145)
TCO2: 27 mmol/L (ref 22–32)

## 2019-05-04 LAB — CBC
HCT: 46.2 % (ref 39.0–52.0)
Hemoglobin: 15.6 g/dL (ref 13.0–17.0)
MCH: 33.5 pg (ref 26.0–34.0)
MCHC: 33.8 g/dL (ref 30.0–36.0)
MCV: 99.1 fL (ref 80.0–100.0)
Platelets: 214 10*3/uL (ref 150–400)
RBC: 4.66 MIL/uL (ref 4.22–5.81)
RDW: 12.7 % (ref 11.5–15.5)
WBC: 6 10*3/uL (ref 4.0–10.5)
nRBC: 0 % (ref 0.0–0.2)

## 2019-05-04 LAB — PROTIME-INR
INR: 1 (ref 0.8–1.2)
Prothrombin Time: 12.8 seconds (ref 11.4–15.2)

## 2019-05-04 LAB — LACTIC ACID, PLASMA: Lactic Acid, Venous: 1.4 mmol/L (ref 0.5–1.9)

## 2019-05-04 LAB — ETHANOL: Alcohol, Ethyl (B): <10 mg/dL (ref <10)

## 2019-05-04 LAB — SAMPLE TO BLOOD BANK

## 2019-05-04 MED ORDER — FENTANYL CITRATE (PF) 100 MCG/2ML IJ SOLN
50.0000 ug | Freq: Once | INTRAMUSCULAR | Status: AC
  Administered 2019-05-04: 50 ug via INTRAVENOUS
  Filled 2019-05-04: qty 2

## 2019-05-04 MED ORDER — KETOROLAC TROMETHAMINE 30 MG/ML IJ SOLN
15.0000 mg | Freq: Once | INTRAMUSCULAR | Status: AC
  Administered 2019-05-04: 15 mg via INTRAVENOUS
  Filled 2019-05-04: qty 1

## 2019-05-04 MED ORDER — OXYCODONE-ACETAMINOPHEN 5-325 MG PO TABS: 1.0000 | ORAL_TABLET | Freq: Three times a day (TID) | ORAL | 0 refills | Status: DC | PRN

## 2019-05-04 MED ORDER — IOHEXOL 300 MG/ML  SOLN
100.0000 mL | Freq: Once | INTRAMUSCULAR | Status: AC | PRN
  Administered 2019-05-04: 100 mL via INTRAVENOUS

## 2019-05-04 NOTE — Progress Notes (Signed)
Orthopedic Tech Progress Note Patient Details:  Tanner [name] Feb 24, 1946 [id number] LEVEL 2 TRAUMA Patient ID: Tanner [name], male   DOB: Apr 27, 1946, 73 y.o.   MRN: [id number]   Janit Pagan 05/04/2019, 4:38 PM

## 2019-05-04 NOTE — ED Provider Notes (Signed)
[facility] EMERGENCY DEPARTMENT Provider Note   CSN: [id number] Arrival date & time: 05/04/19  1622     History   Chief Complaint Chief Complaint  Patient presents with   Fall    HPI Tanner [name] is a 73 y.o. male.    Level 5 caveat due to confusion. HPI Patient presents after fall.  Reportedly was up on a ladder that was around 12 feet long.  Found on the ground with decreased responsiveness.  Patient does not remember what happened.  Complaining of pain in his head and neck and left flank.  Not on anticoagulation.  Has not been ambulatory but states his arms and legs do not hurt.  Abrasions to head.  EMS states patient had been complaining some tingling down his left arm although he is not complaining of it now.  Mental status is also been improving. Past Medical History:  Diagnosis Date   Arthritis     There are no active problems to display for this patient.   Past Surgical History:  Procedure Laterality Date   JOINT REPLACEMENT          Home Medications    Prior to Admission medications   Medication Sig Start Date End Date Taking? Authorizing Provider  oxyCODONE-acetaminophen (PERCOCET/ROXICET) 5-325 MG tablet Take 1-2 tablets by mouth every 8 (eight) hours as needed for severe pain. 05/04/19   Davonna Belling, MD    Family History No family history on file.  Social History Social History   Tobacco Use   Smoking status: Never Smoker   Smokeless tobacco: Never Used  Substance Use Topics   Alcohol use: Yes    Comment: almost ever day   Drug use: Never     Allergies   Valium [diazepam]   Review of Systems Review of Systems  Unable to perform ROS: Mental status change     Physical Exam Updated Vital Signs BP 122/83    Pulse 81    Temp (!) 97.4 F (36.3 C)    Resp 11    Ht 5\' 11"  (1.803 m)    Wt 78.5 kg    SpO2 91%    BMI 24.13 kg/m   Physical Exam Vitals signs and nursing note reviewed.  HENT:     Head:     Comments: Abrasion to left parietal and right occipital area. Eyes:     Extraocular Movements: Extraocular movements intact.     Pupils: Pupils are equal, round, and reactive to light.  Neck:     Comments: Tenderness upper cervical spine. Cardiovascular:     Rate and Rhythm: Normal rate and regular rhythm.  Pulmonary:     Comments: Tender left lower posterior chest.  No crepitance or deformity.  Equal breath sounds. Chest:     Chest wall: Tenderness present.  Abdominal:     Tenderness: There is no abdominal tenderness.  Musculoskeletal:        General: No tenderness.  Skin:    General: Skin is warm.     Capillary Refill: Capillary refill takes less than 2 seconds.  Neurological:     Mental Status: He is alert.     Comments: Awake and pleasant.  Able to tell me he is at the hospital and tell me his name but is not remember the events that happened.      ED Treatments / Results  Labs (all labs ordered are listed, but only abnormal results are displayed) Labs Reviewed  COMPREHENSIVE METABOLIC PANEL -  Abnormal; Notable for the following components:      Result Value   Creatinine, Ser 1.26 (*)    Total Protein 6.2 (*)    AST 46 (*)    GFR calc non Af Amer 56 (*)    All other components within normal limits  I-STAT CHEM 8, ED - Abnormal; Notable for the following components:   Creatinine, Ser 1.30 (*)    All other components within normal limits  CBC  ETHANOL  LACTIC ACID, PLASMA  PROTIME-INR  URINALYSIS, ROUTINE W REFLEX MICROSCOPIC  SAMPLE TO BLOOD BANK    EKG None  Radiology Ct Head Wo Contrast  Result Date: 05/04/2019 CLINICAL DATA:  Unwitnessed fall from ladder. EXAM: CT HEAD WITHOUT CONTRAST CT CERVICAL SPINE WITHOUT CONTRAST TECHNIQUE: Multidetector CT imaging of the head and cervical spine was performed following the standard protocol without intravenous contrast. Multiplanar CT image reconstructions of the cervical spine were also generated. COMPARISON:   None. FINDINGS: CT HEAD FINDINGS Brain: Mild chronic ischemic white matter disease is noted. No mass effect or midline shift is noted. Ventricular size is within normal limits. There is no evidence of mass lesion, hemorrhage or acute infarction. Vascular: No hyperdense vessel or unexpected calcification. Skull: Normal. Negative for fracture or focal lesion. Sinuses/Orbits: No acute finding. Other: None. CT CERVICAL SPINE FINDINGS Alignment: Normal. Skull base and vertebrae: No acute fracture. No primary bone lesion or focal pathologic process. Soft tissues and spinal canal: No prevertebral fluid or swelling. No visible canal hematoma. Disc levels: Moderate degenerative disc disease is noted at C5-6, C6-7 and C7-T1. Upper chest: Negative. Other: None. IMPRESSION: Mild chronic ischemic white matter disease. No acute intracranial abnormality seen. Moderate multilevel degenerative disc disease. No acute abnormality seen in the cervical spine. Electronically Signed   By: Marijo Conception M.D.   On: 05/04/2019 17:33   Ct Chest W Contrast  Result Date: 05/04/2019 CLINICAL DATA:  73 year old male with blunt trauma. EXAM: CT CHEST, ABDOMEN, AND PELVIS WITH CONTRAST TECHNIQUE: Multidetector CT imaging of the chest, abdomen and pelvis was performed following the standard protocol during bolus administration of intravenous contrast. CONTRAST:  111mL OMNIPAQUE IOHEXOL 300 MG/ML  SOLN COMPARISON:  None. FINDINGS: CT CHEST FINDINGS Cardiovascular: There is no cardiomegaly or pericardial effusion. There is coronary vascular calcification. Mild atherosclerotic calcification of the thoracic aorta. No aneurysmal dilatation or dissection. The origins of the great vessels of the aortic arch appear patent as visualized. The central pulmonary arteries are patent. Mediastinum/Nodes: No hilar or mediastinal adenopathy. Esophagus and thyroid gland are grossly unremarkable. No mediastinal fluid collection. Lungs/Pleura: Minimal bibasilar  dependent atelectasis. No focal consolidation, pleural effusion, or pneumothorax. There is a 1 cm pneumatocele in the right upper lobe. The central airways are patent. Musculoskeletal: There is a left shoulder hemiarthroplasty. No acute osseous pathology. CT ABDOMEN PELVIS FINDINGS No intra-abdominal free air or free fluid. Hepatobiliary: No focal liver abnormality is seen. No gallstones, gallbladder wall thickening, or biliary dilatation. Pancreas: The pancreas is unremarkable. There is a 12 mm focal dilatation of the ampullary (coronal series 6, image 39 and axial series 3, image 72). This may represent a duodenal diverticula. An ampullary lesion is less likely but not excluded. Further evaluation with MRCP on a nonemergent basis recommended. Spleen: Normal in size without focal abnormality. Adrenals/Urinary Tract: The adrenal glands are unremarkable. The kidneys, visualized ureters, and urinary bladder appear unremarkable as well. Stomach/Bowel: Apparent nodular and frondlike soft tissue density in the duodenal bulb (series 3, image 67  and coronal series 6, image 40) likely represent duodenal folds. A mass is less likely. Attention on MRCP recommended. There is no bowel obstruction or active inflammation. The appendix is normal. Vascular/Lymphatic: Moderate aortoiliac atherosclerotic disease. The IVC is unremarkable. No portal venous gas. There is no adenopathy. Reproductive: The prostate and seminal vesicles are grossly unremarkable. Other: Umbilical hernia repair mesh. Musculoskeletal: Total bilateral hip arthroplasties. Faint linear lucency through the left superior pubic ramus (series 6 image 43), likely artifactual. A nondisplaced fracture is not excluded. Clinical correlation is recommended. No other acute fracture identified. IMPRESSION: 1. Artifact versus less likely nondisplaced fracture of the left superior pubic ramus. Clinical correlation is recommended. No other acute/traumatic intrathoracic,  abdominal, or pelvic pathology. 2. Focal dilatation of the ampullary region may represent a duodenal diverticula. An ampullary lesion is less likely but not excluded. Further evaluation with MRCP on a nonemergent basis recommended. 3. Aortic Atherosclerosis (ICD10-I70.0). Electronically Signed   By: Anner Crete M.D.   On: 05/04/2019 17:42   Ct Cervical Spine Wo Contrast  Result Date: 05/04/2019 CLINICAL DATA:  Unwitnessed fall from ladder. EXAM: CT HEAD WITHOUT CONTRAST CT CERVICAL SPINE WITHOUT CONTRAST TECHNIQUE: Multidetector CT imaging of the head and cervical spine was performed following the standard protocol without intravenous contrast. Multiplanar CT image reconstructions of the cervical spine were also generated. COMPARISON:  None. FINDINGS: CT HEAD FINDINGS Brain: Mild chronic ischemic white matter disease is noted. No mass effect or midline shift is noted. Ventricular size is within normal limits. There is no evidence of mass lesion, hemorrhage or acute infarction. Vascular: No hyperdense vessel or unexpected calcification. Skull: Normal. Negative for fracture or focal lesion. Sinuses/Orbits: No acute finding. Other: None. CT CERVICAL SPINE FINDINGS Alignment: Normal. Skull base and vertebrae: No acute fracture. No primary bone lesion or focal pathologic process. Soft tissues and spinal canal: No prevertebral fluid or swelling. No visible canal hematoma. Disc levels: Moderate degenerative disc disease is noted at C5-6, C6-7 and C7-T1. Upper chest: Negative. Other: None. IMPRESSION: Mild chronic ischemic white matter disease. No acute intracranial abnormality seen. Moderate multilevel degenerative disc disease. No acute abnormality seen in the cervical spine. Electronically Signed   By: Marijo Conception M.D.   On: 05/04/2019 17:33   Mr Cervical Spine Wo Contrast  Addendum Date: 05/04/2019   ADDENDUM REPORT: 05/04/2019 19:45 ADDENDUM: Study discussed by telephone with Dr. Davonna Belling on  05/04/2019 at 1938 hours. Electronically Signed   By: Genevie Ann M.D.   On: 05/04/2019 19:45   Result Date: 05/04/2019 CLINICAL DATA:  73 year old male status post unwitnessed fall from ladder. Extreme pain in the posterior head and neck. EXAM: MRI CERVICAL SPINE WITHOUT CONTRAST TECHNIQUE: Multiplanar, multisequence MR imaging of the cervical spine was performed. No intravenous contrast was administered. COMPARISON:  Chest, head and cervical spine CT earlier today. FINDINGS: Alignment: Stable mild straightening of lordosis from the earlier CT. No spondylolisthesis. Vertebrae: No marrow edema or evidence of acute osseous abnormality. Visualized bone marrow signal is within normal limits. T2 benign vertebral hemangioma on the left. Cord: Spinal cord signal is within normal limits at all visualized levels. Cervicomedullary junction is within normal limits. Posterior Fossa, vertebral arteries, paraspinal tissues: Negative visible posterior fossa. Preserved major vascular flow voids in the neck. Confluent abnormal STIR hyperintensity in the posterior cervical spine interspinous ligaments from C2-C3 to C6-C7 (series 7, image 7). Associated left greater than right confluent abnormal signal in the posterior recto spine I muscles (series 7, image 12 on  the left). No other cervical spine ligamentous complex signal abnormality. No prevertebral fluid or edema. Negative visible thoracic inlet and lung apices. Disc levels: C2-C3:  Negative. C3-C4: Mild mostly foraminal disc bulge and endplate spurring with mild to moderate facet hypertrophy. No spinal stenosis. Moderate to severe left greater than right C4 foraminal stenosis. C4-C5: Mild disc bulging and endplate spurring, mostly foraminal. Mild to moderate facet hypertrophy. No spinal stenosis. Mild left and moderate to severe right C5 foraminal stenosis. C5-C6: Disc space loss with mild circumferential disc osteophyte complex. Mild facet hypertrophy greater on the right. Mild  ligament flavum hypertrophy. Borderline to mild spinal stenosis. Moderate to severe bilateral C6 foraminal stenosis. C6-C7: Disc space loss with mild circumferential disc osteophyte complex. Mild facet and ligament flavum hypertrophy. Borderline to mild spinal stenosis. Severe left and mild to moderate right C7 foraminal stenosis. C7-T1: Mild disc space loss and circumferential disc bulge with endplate spurring. Mild facet and ligament flavum hypertrophy. No spinal stenosis. Moderate bilateral C8 foraminal stenosis. Negative visible upper thoracic levels. IMPRESSION: 1. Positive for posterior cervical spine ligamentous injury; interspinous ligament injury from C2-C3 to C6-C7. Associated confluent bilateral erector spinae muscle injury greater on the left. 2. No other ligamentous or acute traumatic injury identified in the cervical spine. 3. Generally mild for age cervical spine degeneration with borderline to mild spinal stenosis at C5-C6 and C6-C7. However, there is moderate or severe multilevel neural foraminal stenosis C4 through C8 nerve levels. Electronically Signed: By: Genevie Ann M.D. On: 05/04/2019 19:34   Ct Abdomen Pelvis W Contrast  Result Date: 05/04/2019 CLINICAL DATA:  73 year old male with blunt trauma. EXAM: CT CHEST, ABDOMEN, AND PELVIS WITH CONTRAST TECHNIQUE: Multidetector CT imaging of the chest, abdomen and pelvis was performed following the standard protocol during bolus administration of intravenous contrast. CONTRAST:  11mL OMNIPAQUE IOHEXOL 300 MG/ML  SOLN COMPARISON:  None. FINDINGS: CT CHEST FINDINGS Cardiovascular: There is no cardiomegaly or pericardial effusion. There is coronary vascular calcification. Mild atherosclerotic calcification of the thoracic aorta. No aneurysmal dilatation or dissection. The origins of the great vessels of the aortic arch appear patent as visualized. The central pulmonary arteries are patent. Mediastinum/Nodes: No hilar or mediastinal adenopathy. Esophagus  and thyroid gland are grossly unremarkable. No mediastinal fluid collection. Lungs/Pleura: Minimal bibasilar dependent atelectasis. No focal consolidation, pleural effusion, or pneumothorax. There is a 1 cm pneumatocele in the right upper lobe. The central airways are patent. Musculoskeletal: There is a left shoulder hemiarthroplasty. No acute osseous pathology. CT ABDOMEN PELVIS FINDINGS No intra-abdominal free air or free fluid. Hepatobiliary: No focal liver abnormality is seen. No gallstones, gallbladder wall thickening, or biliary dilatation. Pancreas: The pancreas is unremarkable. There is a 12 mm focal dilatation of the ampullary (coronal series 6, image 39 and axial series 3, image 72). This may represent a duodenal diverticula. An ampullary lesion is less likely but not excluded. Further evaluation with MRCP on a nonemergent basis recommended. Spleen: Normal in size without focal abnormality. Adrenals/Urinary Tract: The adrenal glands are unremarkable. The kidneys, visualized ureters, and urinary bladder appear unremarkable as well. Stomach/Bowel: Apparent nodular and frondlike soft tissue density in the duodenal bulb (series 3, image 67 and coronal series 6, image 40) likely represent duodenal folds. A mass is less likely. Attention on MRCP recommended. There is no bowel obstruction or active inflammation. The appendix is normal. Vascular/Lymphatic: Moderate aortoiliac atherosclerotic disease. The IVC is unremarkable. No portal venous gas. There is no adenopathy. Reproductive: The prostate and seminal vesicles are  grossly unremarkable. Other: Umbilical hernia repair mesh. Musculoskeletal: Total bilateral hip arthroplasties. Faint linear lucency through the left superior pubic ramus (series 6 image 43), likely artifactual. A nondisplaced fracture is not excluded. Clinical correlation is recommended. No other acute fracture identified. IMPRESSION: 1. Artifact versus less likely nondisplaced fracture of the  left superior pubic ramus. Clinical correlation is recommended. No other acute/traumatic intrathoracic, abdominal, or pelvic pathology. 2. Focal dilatation of the ampullary region may represent a duodenal diverticula. An ampullary lesion is less likely but not excluded. Further evaluation with MRCP on a nonemergent basis recommended. 3. Aortic Atherosclerosis (ICD10-I70.0). Electronically Signed   By: Anner Crete M.D.   On: 05/04/2019 17:42   Dg Pelvis Portable  Result Date: 05/04/2019 CLINICAL DATA:  [place] 10 feet from a ladder. EXAM: PORTABLE PELVIS 1-2 VIEWS COMPARISON:  09/18/2014 FINDINGS: No sign of fracture or dislocation. Bilateral hip replacements. Some associated soft tissue calcification as seen previously. IMPRESSION: No acute or traumatic finding.  Old bilateral hip replacements. Electronically Signed   By: Nelson Chimes M.D.   On: 05/04/2019 16:47   Dg Chest Port 1 View  Result Date: 05/04/2019 CLINICAL DATA:  Unwitnessed fall from 10 feet on a ladder. EXAM: PORTABLE CHEST 1 VIEW COMPARISON:  04/18/2019 FINDINGS: Heart size is normal. Chronic aortic atherosclerosis. The lungs are clear. No pneumothorax or hemothorax. No thoracic region acute bone finding based on this single frontal projection. IMPRESSION: No active disease. Electronically Signed   By: Nelson Chimes M.D.   On: 05/04/2019 16:46    Procedures Procedures (including critical care time)  Medications Ordered in ED Medications  iohexol (OMNIPAQUE) 300 MG/ML solution 100 mL (100 mLs Intravenous Contrast Given 05/04/19 1722)  ketorolac (TORADOL) 30 MG/ML injection 15 mg (15 mg Intravenous Given 05/04/19 1840)  fentaNYL (SUBLIMAZE) injection 50 mcg (50 mcg Intravenous Given 05/04/19 1842)     Initial Impression / Assessment and Plan / ED Course  I have reviewed the triage vital signs and the nursing notes.  Pertinent labs & imaging results that were available during my care of the patient were reviewed by me and  considered in my medical decision making (see chart for details).        Patient with fall.  Unknown height but up to 12 feet.  Had a recent concussion.  Head CT reassuring.  Had reassuring CT scan.  However did have some intra-abdominal findings near the pancreas that they will follow-up with Dr. [name] for.  I think he does clinically have a left posterior lower rib fracture.  Will treat with pain medicines.  However MRI done of his cervical spine due to continued pain and tenderness and the fact that it had tingling in his arm.  Showed potentially posterior ligamentous injury.  Seen in the ER by Dr. Christella Noa.  He removed the cervical collar.  Follow-up as an outpatient.  Final Clinical Impressions(s) / ED Diagnoses   Final diagnoses:  Fall, initial encounter  Cervical strain, acute, initial encounter  Closed fracture of one rib of left side, initial encounter  Concussion with loss of consciousness, initial encounter    ED Discharge Orders         Ordered    oxyCODONE-acetaminophen (PERCOCET/ROXICET) 5-325 MG tablet  Every 8 hours PRN,   Status:  Discontinued     05/04/19 2139    oxyCODONE-acetaminophen (PERCOCET/ROXICET) 5-325 MG tablet  Every 8 hours PRN     05/04/19 2140  Davonna Belling, MD 05/04/19 2229

## 2019-05-04 NOTE — ED Triage Notes (Signed)
Pt here from home via EMS after unwitnessed fall from ladder. Branch on tree 10-12 feet off the ground with a straight ladder propped up against it. Pt found on ground by the ladder by neighbor, confused. Branch still intact, although pt thinks the branch broke and the ladder fell after that. Endorses blacking out and waking up in ambulance. C collar in place, tenderness to C spine.

## 2019-05-04 NOTE — Progress Notes (Signed)
Patient ID: Tanner [name], male   DOB: 1945-07-29, 73 y.o.   MRN: [id number] BP 139/84   Pulse 62   Temp (!) 97.4 F (36.3 C)   Resp 11   Ht 5\' 11"  (1.803 m)   Wt 78.5 kg   SpO2 99%   BMI 24.13 kg/m  Alert and orientedx 4 Speech is clear and fluent Perrl, full eom Tongue and uvula midline Normal shoulder shrug 5/5 strength in all extremities Amnestic for fall, observed by neighbor. Positive for LOC Upon arrival to ED cognition significantly improved Currently normal OK for discharge, followup with me in two weeks

## 2019-05-04 NOTE — ED Notes (Signed)
Patient transported to MRI 

## 2019-05-04 NOTE — ED Notes (Signed)
Pt aware we need urine sample, pt stated he barely goes, unable to collect sample at this time.

## 2019-05-04 NOTE — ED Notes (Signed)
Patient transported to CT 

## 2019-05-05 ENCOUNTER — Other Ambulatory Visit: Payer: Self-pay

## 2019-05-05 ENCOUNTER — Emergency Department (HOSPITAL_COMMUNITY): Payer: Medicare Other

## 2019-05-05 ENCOUNTER — Encounter: Payer: Self-pay | Admitting: Urology

## 2019-05-05 ENCOUNTER — Emergency Department (HOSPITAL_COMMUNITY)
Admission: EM | Admit: 2019-05-05 | Discharge: 2019-05-06 | Disposition: A | Discharge status: Home / Self Care | Payer: Medicare Other | Attending: Emergency Medicine | Admitting: Emergency Medicine

## 2019-05-05 ENCOUNTER — Encounter (HOSPITAL_COMMUNITY): Payer: Self-pay | Admitting: Emergency Medicine

## 2019-05-05 DIAGNOSIS — Z85828 Personal history of other malignant neoplasm of skin: Secondary | ICD-10-CM | POA: Diagnosis not present

## 2019-05-05 DIAGNOSIS — Z96643 Presence of artificial hip joint, bilateral: Secondary | ICD-10-CM | POA: Diagnosis not present

## 2019-05-05 DIAGNOSIS — S299XXA Unspecified injury of thorax, initial encounter: Secondary | ICD-10-CM | POA: Diagnosis present

## 2019-05-05 DIAGNOSIS — W11XXXA Fall on and from ladder, initial encounter: Secondary | ICD-10-CM | POA: Insufficient documentation

## 2019-05-05 DIAGNOSIS — Y939 Activity, unspecified: Secondary | ICD-10-CM | POA: Insufficient documentation

## 2019-05-05 DIAGNOSIS — Z79899 Other long term (current) drug therapy: Secondary | ICD-10-CM | POA: Insufficient documentation

## 2019-05-05 DIAGNOSIS — R109 Unspecified abdominal pain: Secondary | ICD-10-CM | POA: Diagnosis not present

## 2019-05-05 DIAGNOSIS — Z7982 Long term (current) use of aspirin: Secondary | ICD-10-CM | POA: Diagnosis not present

## 2019-05-05 DIAGNOSIS — S2242XA Multiple fractures of ribs, left side, initial encounter for closed fracture: Secondary | ICD-10-CM | POA: Diagnosis not present

## 2019-05-05 DIAGNOSIS — Z96612 Presence of left artificial shoulder joint: Secondary | ICD-10-CM | POA: Insufficient documentation

## 2019-05-05 DIAGNOSIS — Y999 Unspecified external cause status: Secondary | ICD-10-CM | POA: Insufficient documentation

## 2019-05-05 DIAGNOSIS — Y929 Unspecified place or not applicable: Secondary | ICD-10-CM | POA: Insufficient documentation

## 2019-05-05 LAB — CBC WITH DIFFERENTIAL/PLATELET
Abs Immature Granulocytes: 0.02 10*3/uL (ref 0.00–0.07)
Basophils Absolute: 0 10*3/uL (ref 0.0–0.1)
Basophils Relative: 0 %
Eosinophils Absolute: 0.1 10*3/uL (ref 0.0–0.5)
Eosinophils Relative: 2 %
HCT: 43.5 % (ref 39.0–52.0)
Hemoglobin: 14.5 g/dL (ref 13.0–17.0)
Immature Granulocytes: 0 %
Lymphocytes Relative: 15 %
Lymphs Abs: 1 10*3/uL (ref 0.7–4.0)
MCH: 33.1 pg (ref 26.0–34.0)
MCHC: 33.3 g/dL (ref 30.0–36.0)
MCV: 99.3 fL (ref 80.0–100.0)
Monocytes Absolute: 0.8 10*3/uL (ref 0.1–1.0)
Monocytes Relative: 13 %
Neutro Abs: 4.5 10*3/uL (ref 1.7–7.7)
Neutrophils Relative %: 70 %
Platelets: 202 10*3/uL (ref 150–400)
RBC: 4.38 MIL/uL (ref 4.22–5.81)
RDW: 12.8 % (ref 11.5–15.5)
WBC: 6.5 10*3/uL (ref 4.0–10.5)
nRBC: 0 % (ref 0.0–0.2)

## 2019-05-05 LAB — BASIC METABOLIC PANEL
Anion gap: 7 (ref 5–15)
BUN: 19 mg/dL (ref 8–23)
CO2: 29 mmol/L (ref 22–32)
Calcium: 9.2 mg/dL (ref 8.9–10.3)
Chloride: 102 mmol/L (ref 98–111)
Creatinine, Ser: 1.22 mg/dL (ref 0.61–1.24)
GFR calc Af Amer: >60 mL/min (ref >60)
GFR calc non Af Amer: 58 mL/min — ABNORMAL LOW (ref >60)
Glucose, Bld: 99 mg/dL (ref 70–99)
Potassium: 4.8 mmol/L (ref 3.5–5.1)
Sodium: 138 mmol/L (ref 135–145)

## 2019-05-05 NOTE — ED Triage Notes (Addendum)
Patient arrived with EMS  from home , he cough and felt a pop at left posterior lower ribcage this evening worse with movement and deep inspiration , patient was seen here yesterday after a fall from a tree sustained 1 left rib fracture . Respirations unlabored / bilateral breath sounds clear. He received Fentanyl 100 mcg IV by EMS prior to arrival .

## 2019-05-05 NOTE — ED Notes (Signed)
Patient Tanner [name] [id number]  CELL [id number]  Please call when we are discharging patient and or if we have any updates on patient

## 2019-05-06 ENCOUNTER — Emergency Department (HOSPITAL_COMMUNITY): Payer: Medicare Other

## 2019-05-06 MED ORDER — OXYCODONE-ACETAMINOPHEN 5-325 MG PO TABS: 1.0000 | ORAL_TABLET | Freq: Three times a day (TID) | ORAL | 0 refills | Status: DC | PRN

## 2019-05-06 MED ORDER — ONDANSETRON HCL 4 MG/2ML IJ SOLN
4.0000 mg | Freq: Once | INTRAMUSCULAR | Status: AC
  Administered 2019-05-06: 4 mg via INTRAVENOUS
  Filled 2019-05-06: qty 2

## 2019-05-06 MED ORDER — HYDROMORPHONE HCL 1 MG/ML IJ SOLN
1.0000 mg | Freq: Once | INTRAMUSCULAR | Status: AC
  Administered 2019-05-06: 1 mg via INTRAVENOUS
  Filled 2019-05-06: qty 1

## 2019-05-06 MED ORDER — KETOROLAC TROMETHAMINE 30 MG/ML IJ SOLN
30.0000 mg | Freq: Once | INTRAMUSCULAR | Status: AC
  Administered 2019-05-06: 30 mg via INTRAVENOUS
  Filled 2019-05-06: qty 1

## 2019-05-06 NOTE — Discharge Instructions (Signed)
You can take 400 mg of ibuprofen every 8 hours You can take the Percocet 4 hours after taking ibuprofen-do not take the Percocet less than 4 hours apart Follow-up with your primary doctor if you have persistent pain next week

## 2019-05-06 NOTE — ED Notes (Signed)
Patient Alert and oriented to baseline. Stable and ambulatory to baseline. Patient verbalized understanding of the discharge instructions.  Patient belongings were taken by the patient.   

## 2019-05-06 NOTE — ED Notes (Signed)
Pt having severe pain in L ribcage; reports coughing, feeling a pop and has had increased pain; states "feels like that rib is broke in half, I can feel it". Pt also reports difficulty moving his legs due to pain in his chest.

## 2019-05-06 NOTE — ED Provider Notes (Signed)
[facility] EMERGENCY DEPARTMENT Provider Note   CSN: [id number] Arrival date & time: 05/05/19  2156     History   Chief Complaint Chief Complaint  Patient presents with   Left Ribcage Pain    HPI Tanner [name] is a 73 y.o. male.     The history is provided by the patient, a significant other and a friend.  Chest Pain Pain location:  L lateral chest Pain quality: sharp   Pain severity:  Severe Onset quality:  Sudden Timing:  Constant Progression:  Worsening Chronicity:  Recurrent Context: breathing   Context comment:  Coughing Relieved by:  Nothing Worsened by:  Coughing and deep breathing Associated symptoms: abdominal pain and cough   Associated symptoms: no weakness   Patient with history of ADD, arthritis presents for left-sided chest wall pain.  Patient was involved in a trauma on December 2 At that time patient was on a ladder and fell.  He had extensive imaging and that ER stay.  He was found to have a cervical spine ligamentous injury as well as a potential left posterior rib fracture Patient also noted to have an incidental pancreatic finding that was referred to GI  Patient was seen by neurosurgery in the ER who removed the cervical collar. Patient was prescribed Percocet for his pain but he did not fill this medication due to concerns about constipation  He reports prior to arrival to the ER tonight, he had a coughing spell and had significant onset of left-sided chest wall pain.  This is around the site where he had the original injury from the fall.  He also reports the pain does move into his abdomen now.  No new neck pain.  No new weakness.  No incontinence.  No new falls since December 2 Past Medical History:  Diagnosis Date   ADD (attention deficit disorder)    Anxiety    Arm thromboembolism, superficial, acute    from IV site, left arm   Arthritis    "thumbs; right knee" (05/01/2016)   Arthritis    Basal cell carcinoma     left ear; right shoulder; chest; top of head"   BPH (benign prostatic hypertrophy)    Depression    Esophageal reflux    Gynecomastia    High cholesterol    IBS (irritable bowel syndrome)    Rosacea    Sleep apnea    central sleep apnea    Patient Active Problem List   Diagnosis Date Noted   Gynecomastia 07/15/2017   S/P shoulder replacement, left 05/01/2016   Abdominal pain, periumbilical [id number]   Avascular necrosis of hip ([place]) 05/11/2013   OA (osteoarthritis) of hip 05/11/2013   OSA (obstructive sleep apnea) 10/17/2011    Past Surgical History:  Procedure Laterality Date   BASAL CELL CARCINOMA EXCISION     left ear; right shoulder; top of head"   BUNIONECTOMY Left    CATARACT EXTRACTION W/ INTRAOCULAR LENS  IMPLANT, BILATERAL Bilateral    CLAVICLE SURGERY Left 1980s   "cut an inch or so off it"   CLOSED REDUCTION HAND FRACTURE Left ~ 1995   "later took the screws out w/pliers"   COLONOSCOPY  10 years ago   pt does not know where/when   ELBOW SURGERY Right    "had a tear; sewed it back onto the bone"   HERNIA REPAIR     INGUINAL HERNIA REPAIR Left 2001   JOINT REPLACEMENT     KNEE ARTHROSCOPY Right  MOUTH SURGERY     teeth removal/implants placed   NEUROMA SURGERY Left    foot   PLANTAR FASCIA RELEASE Right    SHOULDER ARTHROSCOPY W/ ROTATOR CUFF REPAIR Right    TONSILLECTOMY     TOTAL HIP ARTHROPLASTY Right 05/11/2013   Procedure: RIGHT TOTAL HIP ARTHROPLASTY ANTERIOR APPROACH;  Surgeon: Gearlean Alf, MD;  Location: [facility];  Service: Orthopedics;  Laterality: Right;  WOUND CLASSIFICATION-CLEAN   TOTAL HIP ARTHROPLASTY Left 09/18/2014   Procedure: LEFT TOTAL HIP ARTHROPLASTY ANTERIOR APPROACH;  Surgeon: Gaynelle Arabian, MD;  Location: [facility];  Service: Orthopedics;  Laterality: Left;   TOTAL SHOULDER ARTHROPLASTY Left 05/01/2016   TOTAL SHOULDER ARTHROPLASTY Left 05/01/2016   Procedure: TOTAL SHOULDER ARTHROPLASTY;   Surgeon: Justice Britain, MD;  Location: [place];  Service: Orthopedics;  Laterality: Left;   UMBILICAL HERNIA REPAIR  03/01/2013        Home Medications    Prior to Admission medications   Medication Sig Start Date End Date Taking? Authorizing Provider  Aloe-Sodium Chloride (AYR SALINE NASAL GEL NA) Place 1 application into the nose 2 (two) times daily as needed (dryness).    [provider]  amoxicillin (AMOXIL) 500 MG capsule TAKE 4 CAPS BY MOUTH 1 HOUR PRIOR TO APPT 08/24/17   [provider]  amphetamine-dextroamphetamine (ADDERALL XR) 20 MG 24 hr capsule Take 20 mg by mouth 2 (two) times daily. Take 1 in the morning & one in the evening.    [provider]  aspirin 81 MG tablet Take 81 mg by mouth at bedtime.     [provider]  Biotin 1000 MCG tablet Take 1,000 mcg by mouth daily.    [provider]  Carboxymethylcellulose Sodium (REFRESH TEARS OP) Apply 1 drop to eye 3 (three) times daily as needed (dry eyes).    [provider]  clotrimazole-betamethasone (LOTRISONE) cream Apply topically.    [provider]  finasteride (PROSCAR) 5 MG tablet Take 5 mg by mouth daily.    [provider]  fluticasone (FLONASE) 50 MCG/ACT nasal spray Place 2 sprays into both nostrils daily.    [provider]  ibuprofen (ADVIL,MOTRIN) 200 MG tablet Take 200-400 mg by mouth every 6 (six) hours as needed for headache or moderate pain.     [provider]  ivermectin (STROMECTOL) 3 MG TABS tablet Take 5 tablets PO all at once. Repeat in 7 days. 08/19/17   [provider]  ketoconazole (NIZORAL) 2 % cream Apply 1 application topically 3 (three) times a week.    [provider]  Multiple Vitamin (MULTIVITAMIN) capsule Take by mouth.    [provider]  omeprazole (PRILOSEC) 40 MG capsule Take by mouth. 04/16/16   [provider]  oxyCODONE-acetaminophen (PERCOCET/ROXICET) 5-325 MG tablet  Take 1-2 tablets by mouth every 8 (eight) hours as needed for severe pain. 05/04/19   Davonna Belling, MD  permethrin (ELIMITE) 5 % cream Apply 1 application topically 2 (two) times daily.    [provider]  sildenafil (REVATIO) 20 MG tablet Take 20-100 mg by mouth as needed (ED).     [provider]  simvastatin (ZOCOR) 20 MG tablet Take 20 mg by mouth every evening.    [provider]  sodium chloride (OCEAN) 0.65 % SOLN nasal spray Place 1 spray into both nostrils 2 (two) times daily as needed (dryness).    [provider]  SOOLANTRA 1 % CREA APPLY 1 APPLICATION TOPICALLY ONCE DAILY 08/19/17  [provider]  Vilazodone HCl (VIIBRYD) 20 MG TABS TAKE 1 TABLET BY MOUTH EVERY DAY WITH A FULL MEAL 08/08/15   [provider]    Family History Family History  Problem Relation Age of Onset   Heart attack Father 65   Heart disease Father    Lung disease Mother    Colon cancer Paternal Grandmother        age 71-70's   Diabetes Brother    Other Neg Hx        gynecomastia    Social History Social History   Tobacco Use   Smoking status: Never Smoker   Smokeless tobacco: Never Used  Substance Use Topics   Alcohol use: Yes    Comment: almost ever day   Drug use: Never     Allergies   Valium [diazepam] and Valium [diazepam]   Review of Systems Review of Systems  Respiratory: Positive for cough.   Cardiovascular: Positive for chest pain.  Gastrointestinal: Positive for abdominal pain.  Musculoskeletal: Positive for arthralgias.  Neurological: Negative for weakness.  All other systems reviewed and are negative.    Physical Exam Updated Vital Signs BP 136/70 (BP Location: Right Arm)    Pulse (!) 58    Temp 97.9 F (36.6 C) (Oral)    Resp 18    SpO2 98%   Physical Exam CONSTITUTIONAL: Elderly, uncomfortable. HEAD: Normocephalic/atraumatic EYES: EOMI/PERRL ENMT: Mask in place NECK: Soft cervical collar in  place CV: S1/S2 noted, no murmurs/rubs/gallops noted LUNGS: Lungs are clear to auscultation bilaterally, no apparent distress Chest-severe point tenderness noted to left posterior ribs.  No crepitus. ABDOMEN: soft, moderate LUQ tenderness, no rebound or guarding, bowel sounds noted throughout abdomen GU:no cva tenderness NEURO: Pt is awake/alert/appropriate, moves all extremitiesx4.  No facial droop.   EXTREMITIES: pulses normal/equal, full ROM, no signs of trauma SKIN: warm, color normal PSYCH: Anxious   ED Treatments / Results  Labs (all labs ordered are listed, but only abnormal results are displayed) Labs Reviewed  BASIC METABOLIC PANEL - Abnormal; Notable for the following components:      Result Value   GFR calc non Af Amer 58 (*)    All other components within normal limits  CBC WITH DIFFERENTIAL/PLATELET    EKG EKG Interpretation  Date/Time:  Thursday May 05 2019 21:58:08 EST Ventricular Rate:  63 PR Interval:  170 QRS Duration: 72 QT Interval:  398 QTC Calculation: 407 R Axis:   35 Text Interpretation: Normal sinus rhythm Confirmed by Randal Buba, April ([id number]) on 05/05/2019 11:55:53 PM   Radiology Ct Abdomen Pelvis Wo Contrast  Result Date: 05/06/2019 CLINICAL DATA:  [place] from a ladder yesterday. Persistent left rib, back and abdominal pain. EXAM: CT CHEST, ABDOMEN AND PELVIS WITHOUT CONTRAST TECHNIQUE: Multidetector CT imaging of the chest, abdomen and pelvis was performed following the standard protocol without IV contrast. COMPARISON:  CT scan from 05/04/2019 FINDINGS: CT CHEST FINDINGS Cardiovascular: The heart is normal in size. No pericardial effusion. Stable mild tortuosity and calcification of the thoracic aorta. Stable scattered coronary artery calcifications. Mediastinum/Nodes: No mediastinal or hilar mass or adenopathy or hematoma. Lungs/Pleura: New streaky bibasilar atelectasis but no pleural effusions or pneumothorax. No pulmonary contusion. No worrisome  pulmonary lesions. Musculoskeletal: There are posterior left tenth and eleventh rib fractures. The tenth rib fracture is subtle and nondisplaced. The eleventh rib fracture is now slightly displaced and much more obvious than on the prior chest CT. No right-sided rib fractures are identified. The sternum is intact.  The thoracic vertebral bodies are normally aligned. Suspect a subtle superior endplate fracture of 624THL. No retropulsion. CT ABDOMEN PELVIS FINDINGS Hepatobiliary: No focal hepatic lesions or evidence of acute hepatic injury. No perihepatic fluid collections. The gallbladder is normal. No common bile duct dilatation. Pancreas: No mass, inflammation or ductal dilatation. No acute injury or peripancreatic fluid collection. Small duodenal diverticulum noted near the pancreatic head. Spleen: Normal size. No focal lesions. No acute injury. No perisplenic fluid collections. Adrenals/Urinary Tract: Adrenal glands and kidneys are unremarkable. No acute renal injury or perinephric fluid collection. The bladder is distended with an estimated volume of 2000 cc. Stomach/Bowel: The stomach, duodenum, small bowel and colon are grossly normal without oral contrast. No inflammatory changes, mass lesions or obstructive findings. The appendix is normal. Vascular/Lymphatic: Moderate atherosclerotic calcifications involving the aorta iliac arteries but no aneurysm. No mesenteric or retroperitoneal mass, adenopathy or hematoma. Reproductive: The prostate gland and seminal vesicles are grossly normal but mainly obscured by artifact from bilateral hip prostheses. Other: No free pelvic fluid collection or pelvic hematoma. Musculoskeletal: Bilateral total hip arthroplasties without complicating features. Large area of heterotopic ossification noted near the right hip. The pubic symphysis and SI joints are intact. IMPRESSION: 1. Left tenth and eleventh rib fractures posteriorly. 2. Superior endplate fracture of 624THL. No significant  compression deformity or retropulsion. 3. Distended bladder with volume estimated at 2 L. 4. Bibasilar atelectasis but no pulmonary contusion, pleural effusion or pneumothorax. 5. No acute intra-abdominal/intrapelvic findings. No evidence of solid abdominal organ injury or secondary findings for bowel injury. Electronically Signed   By: Marijo Sanes M.D.   On: 05/06/2019 05:33   Dg Ribs Unilateral W/chest Left  Result Date: 05/05/2019 CLINICAL DATA:  Cough with pain EXAM: LEFT RIBS AND CHEST - 3+ VIEW COMPARISON:  05/04/2019 FINDINGS: Single-view chest demonstrates streaky atelectasis at the left base. Normal heart size. Aortic atherosclerosis. No pneumothorax or pleural effusion. Left shoulder replacement. IMPRESSION: 1. Streaky atelectasis at the left base.  Negative for pneumothorax. 2. No definite acute displaced left rib fracture Electronically Signed   By: Donavan Foil M.D.   On: 05/05/2019 22:38   Ct Head Wo Contrast  Result Date: 05/04/2019 CLINICAL DATA:  Unwitnessed fall from ladder. EXAM: CT HEAD WITHOUT CONTRAST CT CERVICAL SPINE WITHOUT CONTRAST TECHNIQUE: Multidetector CT imaging of the head and cervical spine was performed following the standard protocol without intravenous contrast. Multiplanar CT image reconstructions of the cervical spine were also generated. COMPARISON:  None. FINDINGS: CT HEAD FINDINGS Brain: Mild chronic ischemic white matter disease is noted. No mass effect or midline shift is noted. Ventricular size is within normal limits. There is no evidence of mass lesion, hemorrhage or acute infarction. Vascular: No hyperdense vessel or unexpected calcification. Skull: Normal. Negative for fracture or focal lesion. Sinuses/Orbits: No acute finding. Other: None. CT CERVICAL SPINE FINDINGS Alignment: Normal. Skull base and vertebrae: No acute fracture. No primary bone lesion or focal pathologic process. Soft tissues and spinal canal: No prevertebral fluid or swelling. No visible  canal hematoma. Disc levels: Moderate degenerative disc disease is noted at C5-6, C6-7 and C7-T1. Upper chest: Negative. Other: None. IMPRESSION: Mild chronic ischemic white matter disease. No acute intracranial abnormality seen. Moderate multilevel degenerative disc disease. No acute abnormality seen in the cervical spine. Electronically Signed   By: Marijo Conception M.D.   On: 05/04/2019 17:33   Ct Chest Wo Contrast  Result Date: 05/06/2019 CLINICAL DATA:  [place] from a ladder yesterday. Persistent left rib,  back and abdominal pain. EXAM: CT CHEST, ABDOMEN AND PELVIS WITHOUT CONTRAST TECHNIQUE: Multidetector CT imaging of the chest, abdomen and pelvis was performed following the standard protocol without IV contrast. COMPARISON:  CT scan from 05/04/2019 FINDINGS: CT CHEST FINDINGS Cardiovascular: The heart is normal in size. No pericardial effusion. Stable mild tortuosity and calcification of the thoracic aorta. Stable scattered coronary artery calcifications. Mediastinum/Nodes: No mediastinal or hilar mass or adenopathy or hematoma. Lungs/Pleura: New streaky bibasilar atelectasis but no pleural effusions or pneumothorax. No pulmonary contusion. No worrisome pulmonary lesions. Musculoskeletal: There are posterior left tenth and eleventh rib fractures. The tenth rib fracture is subtle and nondisplaced. The eleventh rib fracture is now slightly displaced and much more obvious than on the prior chest CT. No right-sided rib fractures are identified. The sternum is intact. The thoracic vertebral bodies are normally aligned. Suspect a subtle superior endplate fracture of 624THL. No retropulsion. CT ABDOMEN PELVIS FINDINGS Hepatobiliary: No focal hepatic lesions or evidence of acute hepatic injury. No perihepatic fluid collections. The gallbladder is normal. No common bile duct dilatation. Pancreas: No mass, inflammation or ductal dilatation. No acute injury or peripancreatic fluid collection. Small duodenal diverticulum  noted near the pancreatic head. Spleen: Normal size. No focal lesions. No acute injury. No perisplenic fluid collections. Adrenals/Urinary Tract: Adrenal glands and kidneys are unremarkable. No acute renal injury or perinephric fluid collection. The bladder is distended with an estimated volume of 2000 cc. Stomach/Bowel: The stomach, duodenum, small bowel and colon are grossly normal without oral contrast. No inflammatory changes, mass lesions or obstructive findings. The appendix is normal. Vascular/Lymphatic: Moderate atherosclerotic calcifications involving the aorta iliac arteries but no aneurysm. No mesenteric or retroperitoneal mass, adenopathy or hematoma. Reproductive: The prostate gland and seminal vesicles are grossly normal but mainly obscured by artifact from bilateral hip prostheses. Other: No free pelvic fluid collection or pelvic hematoma. Musculoskeletal: Bilateral total hip arthroplasties without complicating features. Large area of heterotopic ossification noted near the right hip. The pubic symphysis and SI joints are intact. IMPRESSION: 1. Left tenth and eleventh rib fractures posteriorly. 2. Superior endplate fracture of 624THL. No significant compression deformity or retropulsion. 3. Distended bladder with volume estimated at 2 L. 4. Bibasilar atelectasis but no pulmonary contusion, pleural effusion or pneumothorax. 5. No acute intra-abdominal/intrapelvic findings. No evidence of solid abdominal organ injury or secondary findings for bowel injury. Electronically Signed   By: Marijo Sanes M.D.   On: 05/06/2019 05:33   Ct Chest W Contrast  Result Date: 05/04/2019 CLINICAL DATA:  73 year old male with blunt trauma. EXAM: CT CHEST, ABDOMEN, AND PELVIS WITH CONTRAST TECHNIQUE: Multidetector CT imaging of the chest, abdomen and pelvis was performed following the standard protocol during bolus administration of intravenous contrast. CONTRAST:  157mL OMNIPAQUE IOHEXOL 300 MG/ML  SOLN COMPARISON:   None. FINDINGS: CT CHEST FINDINGS Cardiovascular: There is no cardiomegaly or pericardial effusion. There is coronary vascular calcification. Mild atherosclerotic calcification of the thoracic aorta. No aneurysmal dilatation or dissection. The origins of the great vessels of the aortic arch appear patent as visualized. The central pulmonary arteries are patent. Mediastinum/Nodes: No hilar or mediastinal adenopathy. Esophagus and thyroid gland are grossly unremarkable. No mediastinal fluid collection. Lungs/Pleura: Minimal bibasilar dependent atelectasis. No focal consolidation, pleural effusion, or pneumothorax. There is a 1 cm pneumatocele in the right upper lobe. The central airways are patent. Musculoskeletal: There is a left shoulder hemiarthroplasty. No acute osseous pathology. CT ABDOMEN PELVIS FINDINGS No intra-abdominal free air or free fluid. Hepatobiliary: No focal  liver abnormality is seen. No gallstones, gallbladder wall thickening, or biliary dilatation. Pancreas: The pancreas is unremarkable. There is a 12 mm focal dilatation of the ampullary (coronal series 6, image 39 and axial series 3, image 72). This may represent a duodenal diverticula. An ampullary lesion is less likely but not excluded. Further evaluation with MRCP on a nonemergent basis recommended. Spleen: Normal in size without focal abnormality. Adrenals/Urinary Tract: The adrenal glands are unremarkable. The kidneys, visualized ureters, and urinary bladder appear unremarkable as well. Stomach/Bowel: Apparent nodular and frondlike soft tissue density in the duodenal bulb (series 3, image 67 and coronal series 6, image 40) likely represent duodenal folds. A mass is less likely. Attention on MRCP recommended. There is no bowel obstruction or active inflammation. The appendix is normal. Vascular/Lymphatic: Moderate aortoiliac atherosclerotic disease. The IVC is unremarkable. No portal venous gas. There is no adenopathy. Reproductive: The  prostate and seminal vesicles are grossly unremarkable. Other: Umbilical hernia repair mesh. Musculoskeletal: Total bilateral hip arthroplasties. Faint linear lucency through the left superior pubic ramus (series 6 image 43), likely artifactual. A nondisplaced fracture is not excluded. Clinical correlation is recommended. No other acute fracture identified. IMPRESSION: 1. Artifact versus less likely nondisplaced fracture of the left superior pubic ramus. Clinical correlation is recommended. No other acute/traumatic intrathoracic, abdominal, or pelvic pathology. 2. Focal dilatation of the ampullary region may represent a duodenal diverticula. An ampullary lesion is less likely but not excluded. Further evaluation with MRCP on a nonemergent basis recommended. 3. Aortic Atherosclerosis (ICD10-I70.0). Electronically Signed   By: Anner Crete M.D.   On: 05/04/2019 17:42   Ct Cervical Spine Wo Contrast  Result Date: 05/04/2019 CLINICAL DATA:  Unwitnessed fall from ladder. EXAM: CT HEAD WITHOUT CONTRAST CT CERVICAL SPINE WITHOUT CONTRAST TECHNIQUE: Multidetector CT imaging of the head and cervical spine was performed following the standard protocol without intravenous contrast. Multiplanar CT image reconstructions of the cervical spine were also generated. COMPARISON:  None. FINDINGS: CT HEAD FINDINGS Brain: Mild chronic ischemic white matter disease is noted. No mass effect or midline shift is noted. Ventricular size is within normal limits. There is no evidence of mass lesion, hemorrhage or acute infarction. Vascular: No hyperdense vessel or unexpected calcification. Skull: Normal. Negative for fracture or focal lesion. Sinuses/Orbits: No acute finding. Other: None. CT CERVICAL SPINE FINDINGS Alignment: Normal. Skull base and vertebrae: No acute fracture. No primary bone lesion or focal pathologic process. Soft tissues and spinal canal: No prevertebral fluid or swelling. No visible canal hematoma. Disc levels:  Moderate degenerative disc disease is noted at C5-6, C6-7 and C7-T1. Upper chest: Negative. Other: None. IMPRESSION: Mild chronic ischemic white matter disease. No acute intracranial abnormality seen. Moderate multilevel degenerative disc disease. No acute abnormality seen in the cervical spine. Electronically Signed   By: Marijo Conception M.D.   On: 05/04/2019 17:33   Mr Cervical Spine Wo Contrast  Addendum Date: 05/04/2019   ADDENDUM REPORT: 05/04/2019 19:45 ADDENDUM: Study discussed by telephone with Dr. Davonna Belling on 05/04/2019 at 1938 hours. Electronically Signed   By: Genevie Ann M.D.   On: 05/04/2019 19:45   Result Date: 05/04/2019 CLINICAL DATA:  73 year old male status post unwitnessed fall from ladder. Extreme pain in the posterior head and neck. EXAM: MRI CERVICAL SPINE WITHOUT CONTRAST TECHNIQUE: Multiplanar, multisequence MR imaging of the cervical spine was performed. No intravenous contrast was administered. COMPARISON:  Chest, head and cervical spine CT earlier today. FINDINGS: Alignment: Stable mild straightening of lordosis from the earlier  CT. No spondylolisthesis. Vertebrae: No marrow edema or evidence of acute osseous abnormality. Visualized bone marrow signal is within normal limits. T2 benign vertebral hemangioma on the left. Cord: Spinal cord signal is within normal limits at all visualized levels. Cervicomedullary junction is within normal limits. Posterior Fossa, vertebral arteries, paraspinal tissues: Negative visible posterior fossa. Preserved major vascular flow voids in the neck. Confluent abnormal STIR hyperintensity in the posterior cervical spine interspinous ligaments from C2-C3 to C6-C7 (series 7, image 7). Associated left greater than right confluent abnormal signal in the posterior recto spine I muscles (series 7, image 12 on the left). No other cervical spine ligamentous complex signal abnormality. No prevertebral fluid or edema. Negative visible thoracic inlet and lung  apices. Disc levels: C2-C3:  Negative. C3-C4: Mild mostly foraminal disc bulge and endplate spurring with mild to moderate facet hypertrophy. No spinal stenosis. Moderate to severe left greater than right C4 foraminal stenosis. C4-C5: Mild disc bulging and endplate spurring, mostly foraminal. Mild to moderate facet hypertrophy. No spinal stenosis. Mild left and moderate to severe right C5 foraminal stenosis. C5-C6: Disc space loss with mild circumferential disc osteophyte complex. Mild facet hypertrophy greater on the right. Mild ligament flavum hypertrophy. Borderline to mild spinal stenosis. Moderate to severe bilateral C6 foraminal stenosis. C6-C7: Disc space loss with mild circumferential disc osteophyte complex. Mild facet and ligament flavum hypertrophy. Borderline to mild spinal stenosis. Severe left and mild to moderate right C7 foraminal stenosis. C7-T1: Mild disc space loss and circumferential disc bulge with endplate spurring. Mild facet and ligament flavum hypertrophy. No spinal stenosis. Moderate bilateral C8 foraminal stenosis. Negative visible upper thoracic levels. IMPRESSION: 1. Positive for posterior cervical spine ligamentous injury; interspinous ligament injury from C2-C3 to C6-C7. Associated confluent bilateral erector spinae muscle injury greater on the left. 2. No other ligamentous or acute traumatic injury identified in the cervical spine. 3. Generally mild for age cervical spine degeneration with borderline to mild spinal stenosis at C5-C6 and C6-C7. However, there is moderate or severe multilevel neural foraminal stenosis C4 through C8 nerve levels. Electronically Signed: By: Genevie Ann M.D. On: 05/04/2019 19:34   Ct Abdomen Pelvis W Contrast  Result Date: 05/04/2019 CLINICAL DATA:  73 year old male with blunt trauma. EXAM: CT CHEST, ABDOMEN, AND PELVIS WITH CONTRAST TECHNIQUE: Multidetector CT imaging of the chest, abdomen and pelvis was performed following the standard protocol during  bolus administration of intravenous contrast. CONTRAST:  149mL OMNIPAQUE IOHEXOL 300 MG/ML  SOLN COMPARISON:  None. FINDINGS: CT CHEST FINDINGS Cardiovascular: There is no cardiomegaly or pericardial effusion. There is coronary vascular calcification. Mild atherosclerotic calcification of the thoracic aorta. No aneurysmal dilatation or dissection. The origins of the great vessels of the aortic arch appear patent as visualized. The central pulmonary arteries are patent. Mediastinum/Nodes: No hilar or mediastinal adenopathy. Esophagus and thyroid gland are grossly unremarkable. No mediastinal fluid collection. Lungs/Pleura: Minimal bibasilar dependent atelectasis. No focal consolidation, pleural effusion, or pneumothorax. There is a 1 cm pneumatocele in the right upper lobe. The central airways are patent. Musculoskeletal: There is a left shoulder hemiarthroplasty. No acute osseous pathology. CT ABDOMEN PELVIS FINDINGS No intra-abdominal free air or free fluid. Hepatobiliary: No focal liver abnormality is seen. No gallstones, gallbladder wall thickening, or biliary dilatation. Pancreas: The pancreas is unremarkable. There is a 12 mm focal dilatation of the ampullary (coronal series 6, image 39 and axial series 3, image 72). This may represent a duodenal diverticula. An ampullary lesion is less likely but not excluded. Further evaluation  with MRCP on a nonemergent basis recommended. Spleen: Normal in size without focal abnormality. Adrenals/Urinary Tract: The adrenal glands are unremarkable. The kidneys, visualized ureters, and urinary bladder appear unremarkable as well. Stomach/Bowel: Apparent nodular and frondlike soft tissue density in the duodenal bulb (series 3, image 67 and coronal series 6, image 40) likely represent duodenal folds. A mass is less likely. Attention on MRCP recommended. There is no bowel obstruction or active inflammation. The appendix is normal. Vascular/Lymphatic: Moderate aortoiliac  atherosclerotic disease. The IVC is unremarkable. No portal venous gas. There is no adenopathy. Reproductive: The prostate and seminal vesicles are grossly unremarkable. Other: Umbilical hernia repair mesh. Musculoskeletal: Total bilateral hip arthroplasties. Faint linear lucency through the left superior pubic ramus (series 6 image 43), likely artifactual. A nondisplaced fracture is not excluded. Clinical correlation is recommended. No other acute fracture identified. IMPRESSION: 1. Artifact versus less likely nondisplaced fracture of the left superior pubic ramus. Clinical correlation is recommended. No other acute/traumatic intrathoracic, abdominal, or pelvic pathology. 2. Focal dilatation of the ampullary region may represent a duodenal diverticula. An ampullary lesion is less likely but not excluded. Further evaluation with MRCP on a nonemergent basis recommended. 3. Aortic Atherosclerosis (ICD10-I70.0). Electronically Signed   By: Anner Crete M.D.   On: 05/04/2019 17:42   Dg Pelvis Portable  Result Date: 05/04/2019 CLINICAL DATA:  [place] 10 feet from a ladder. EXAM: PORTABLE PELVIS 1-2 VIEWS COMPARISON:  09/18/2014 FINDINGS: No sign of fracture or dislocation. Bilateral hip replacements. Some associated soft tissue calcification as seen previously. IMPRESSION: No acute or traumatic finding.  Old bilateral hip replacements. Electronically Signed   By: Nelson Chimes M.D.   On: 05/04/2019 16:47   Dg Chest Port 1 View  Result Date: 05/04/2019 CLINICAL DATA:  Unwitnessed fall from 10 feet on a ladder. EXAM: PORTABLE CHEST 1 VIEW COMPARISON:  04/18/2019 FINDINGS: Heart size is normal. Chronic aortic atherosclerosis. The lungs are clear. No pneumothorax or hemothorax. No thoracic region acute bone finding based on this single frontal projection. IMPRESSION: No active disease. Electronically Signed   By: Nelson Chimes M.D.   On: 05/04/2019 16:46    Procedures Procedures   Medications Ordered in  ED Medications  HYDROmorphone (DILAUDID) injection 1 mg (1 mg Intravenous Given 05/06/19 0415)  ondansetron (ZOFRAN) injection 4 mg (4 mg Intravenous Given 05/06/19 0415)     Initial Impression / Assessment and Plan / ED Course  I have reviewed the triage vital signs and the nursing notes.  Pertinent labs & imaging results that were available during my care of the patient were reviewed by me and considered in my medical decision making (see chart for details).        4:21 AM Patient presents for evaluation after recent fall.  He is in significant pain and has point tenderness to his left posterior chest. Screening chest x-ray at today's visit is negative for PTX He is Not hypoxic.  Plan will be to treat his pain and reassess 4:48 AM Patient essentially has unchanged pain after IV Dilaudid.  He has significant pain throughout his left chest and abdomen with any movement or palpation. Patient require CT imaging of abd/pelvis to determine if there is any injuries that did not appear on initial imaging.  We will not give IV contrast due to recent contrast study.   CT imaging shows 2 rib fractures but no other acute findings. Patient did have distended bladder but reports he has to urinate Patient had some improvement.  I  did add on additional 5 Percocet  we discussed appropriate use of ibuprofen and Percocet at home.  He never had his original prescription filled also advised him to use stool softeners He can follow-up with his PCP next week if pain continues Final Clinical Impressions(s) / ED Diagnoses   Final diagnoses:  Closed fracture of multiple ribs of left side, initial encounter    ED Discharge Orders         Ordered    oxyCODONE-acetaminophen (PERCOCET) 5-325 MG tablet  Every 8 hours PRN     05/06/19 [id number]           Ripley Fraise, MD 05/06/19 [phone]

## 2019-05-06 NOTE — ED Notes (Signed)
Pt is complaining of being in a lot of pain, RN Claiborne Billings informed

## 2019-05-19 ENCOUNTER — Ambulatory Visit: Payer: Medicare Other | Admitting: Internal Medicine

## 2019-05-19 ENCOUNTER — Encounter: Payer: Self-pay | Admitting: Internal Medicine

## 2019-05-19 VITALS — BP 110/70 | HR 89 | Temp 98.3°F | Ht 70.5 in | Wt 179.0 lb

## 2019-05-19 DIAGNOSIS — Z1159 Encounter for screening for other viral diseases: Secondary | ICD-10-CM

## 2019-05-19 DIAGNOSIS — R9389 Abnormal findings on diagnostic imaging of other specified body structures: Secondary | ICD-10-CM | POA: Diagnosis not present

## 2019-05-19 NOTE — Patient Instructions (Signed)
You have been scheduled for an endoscopy. Please follow written instructions given to you at your visit today. If you use inhalers (even only as needed), please bring them with you on the day of your procedure.   

## 2019-05-19 NOTE — Progress Notes (Signed)
HISTORY OF PRESENT ILLNESS:  Tanner [name] is a 73 y.o. male with past medical history as listed below who is referred today by his primary care provider regarding "abnormality of my pancreas".  He is accompanied by his wife.  I last saw the patient August 2016 when he underwent surveillance colonoscopy.  He was found to have a diminutive adenoma.  The exam was otherwise normal.  Follow-up in 5 years recommended.  Patient's recent history dates to May 04, 2019 when he fell off a ladder while hanging Christmas lights.  He went to the emergency room for evaluation and was found to have broken ribs.  CT imaging that day showed that the pancreas was normal.  Question was raised about possible periampullary diverticulum versus ampullary neoplasm.  A follow-up CT scan 2 days later suggested the lesion was more consistent with a diverticulum.  He presents today.  He remains sore from his traumatic fall.  He did have some transient abdominal pain associated with narcotic induced constipation which has since been relieved.  In addition to reviewing his x-rays, review of blood work from May 04, 2019 shows normal hemoglobin of 15.5.  REVIEW OF SYSTEMS:  All non-GI ROS negative unless otherwise stated in the HPI except for rib pain  Past Medical History:  Diagnosis Date  . ADD (attention deficit disorder)   . Anxiety   . Arm thromboembolism, superficial, acute    from IV site, left arm  . Arthritis    "thumbs; right knee" (05/01/2016)  . Arthritis   . Basal cell carcinoma    left ear; right shoulder; chest; top of head"  . BPH (benign prostatic hypertrophy)   . Depression   . Esophageal reflux   . Gynecomastia   . High cholesterol   . IBS (irritable bowel syndrome)   . Rosacea   . Sleep apnea    central sleep apnea    Past Surgical History:  Procedure Laterality Date  . BASAL CELL CARCINOMA EXCISION     left ear; right shoulder; top of head"  . BUNIONECTOMY Left   . CATARACT  EXTRACTION W/ INTRAOCULAR LENS  IMPLANT, BILATERAL Bilateral   . CLAVICLE SURGERY Left 1980s   "cut an inch or so off it"  . CLOSED REDUCTION HAND FRACTURE Left ~ 1995   "later took the screws out w/pliers"  . COLONOSCOPY  10 years ago   pt does not know where/when  . ELBOW SURGERY Right    "had a tear; sewed it back onto the bone"  . HERNIA REPAIR    . INGUINAL HERNIA REPAIR Left 2001  . JOINT REPLACEMENT    . KNEE ARTHROSCOPY Right   . MOUTH SURGERY     teeth removal/implants placed  . NEUROMA SURGERY Left    foot  . PLANTAR FASCIA RELEASE Right   . SHOULDER ARTHROSCOPY W/ ROTATOR CUFF REPAIR Right   . TONSILLECTOMY    . TOTAL HIP ARTHROPLASTY Right 05/11/2013   Procedure: RIGHT TOTAL HIP ARTHROPLASTY ANTERIOR APPROACH;  Surgeon: Gearlean Alf, MD;  Location: [facility];  Service: Orthopedics;  Laterality: Right;  WOUND CLASSIFICATION-CLEAN  . TOTAL HIP ARTHROPLASTY Left 09/18/2014   Procedure: LEFT TOTAL HIP ARTHROPLASTY ANTERIOR APPROACH;  Surgeon: Gaynelle Arabian, MD;  Location: [facility];  Service: Orthopedics;  Laterality: Left;  . TOTAL SHOULDER ARTHROPLASTY Left 05/01/2016  . TOTAL SHOULDER ARTHROPLASTY Left 05/01/2016   Procedure: TOTAL SHOULDER ARTHROPLASTY;  Surgeon: Justice Britain, MD;  Location: [facility];  Service: Orthopedics;  Laterality: Left;  . UMBILICAL HERNIA REPAIR  03/01/2013    Social History LOGUN CHINO  reports that he has never smoked. He has never used smokeless tobacco. He reports current alcohol use. He reports that he does not use drugs.  family history includes Colon cancer in his paternal grandmother; Diabetes in his brother; Heart attack (age of onset: 40) in his father; Heart disease in his father; Lung disease in his mother.  Allergies  Allergen Reactions  . Valium [Diazepam]     Pt get's "mean, nasty, and paranoid"  . Valium [Diazepam] Anxiety    Becomes irrational and irritable       PHYSICAL EXAMINATION: Vital signs: BP 110/70 (BP Location:  Left Arm, Patient Position: Sitting)   Pulse 89   Temp 98.3 F (36.8 C)   Ht 5' 10.5" (1.791 m)   Wt 179 lb (81.2 kg)   SpO2 98%   BMI 25.32 kg/m   Constitutional: generally well-appearing, no acute distress but uncomfortable appearing Psychiatric: alert and oriented x3, cooperative Eyes: extraocular movements intact, anicteric, conjunctiva pink Mouth: Mask Neck: supple no lymphadenopathy Cardiovascular: heart regular rate and rhythm, no murmur Lungs: clear to auscultation bilaterally Abdomen: soft, nontender, nondistended, no obvious ascites, no peritoneal signs, normal bowel sounds, no organomegaly Rectal: Omitted Extremities: no clubbing, cyanosis, or lower extremity edema bilaterally Skin: no lesions on visible extremities Neuro: No focal deficits.  Cranial nerves intact  ASSESSMENT:  1.  Question of ampullary or periampullary abnormality on CT imaging.  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  Less likely neoplasm 2.  History of adenomatous colon polyp on colonoscopy August 2016 3.  Recent traumatic fall with broken ribs  PLAN:  1.  Schedule upper endoscopy, after the new year, to evaluate the periampullary region and clarify any questions raised on CT imaging.The nature of the procedure, as well as the risks, benefits, and alternatives were carefully and thoroughly reviewed with the patient. Ample time for discussion and questions allowed. The patient understood, was satisfied, and agreed to proceed. 2.  Surveillance colonoscopy around August 2021

## 2019-06-24 ENCOUNTER — Ambulatory Visit: Payer: Medicare PPO | Attending: Internal Medicine

## 2019-06-24 DIAGNOSIS — Z23 Encounter for immunization: Secondary | ICD-10-CM | POA: Insufficient documentation

## 2019-06-24 NOTE — Progress Notes (Signed)
   Covid-19 Vaccination Clinic  Name:  Tanner [name]    MRN: [id number] DOB: 14-Apr-1946  06/24/2019  [name] was observed post Covid-19 immunization for 15 minutes without incidence. He was provided with Vaccine Information Sheet and instruction to access the V-Safe system.   [name] was instructed to call 911 with any severe reactions post vaccine: Marland Kitchen Difficulty breathing  . Swelling of your face and throat  . A fast heartbeat  . A bad rash all over your body  . Dizziness and weakness    Immunizations Administered    Name Date Dose VIS Date Route   Pfizer COVID-19 Vaccine 06/24/2019  6:37 PM 0.3 mL 05/13/2019 Intramuscular   Manufacturer: [place]   Lot: [id number]   [place]: [id number]

## 2019-06-27 ENCOUNTER — Other Ambulatory Visit: Payer: Self-pay | Admitting: Internal Medicine

## 2019-06-27 ENCOUNTER — Ambulatory Visit (INDEPENDENT_AMBULATORY_CARE_PROVIDER_SITE_OTHER): Payer: Medicare PPO

## 2019-06-27 DIAGNOSIS — Z1159 Encounter for screening for other viral diseases: Secondary | ICD-10-CM

## 2019-06-27 LAB — SARS CORONAVIRUS 2 (TAT 6-24 HRS): SARS Coronavirus 2: NEGATIVE

## 2019-06-29 ENCOUNTER — Ambulatory Visit (AMBULATORY_SURGERY_CENTER): Payer: Medicare PPO | Admitting: Internal Medicine

## 2019-06-29 ENCOUNTER — Other Ambulatory Visit: Payer: Self-pay

## 2019-06-29 ENCOUNTER — Encounter: Payer: Self-pay | Admitting: Internal Medicine

## 2019-06-29 VITALS — BP 94/56 | HR 54 | Temp 97.1°F | Resp 12 | Ht 70.0 in | Wt 179.0 lb

## 2019-06-29 DIAGNOSIS — R9389 Abnormal findings on diagnostic imaging of other specified body structures: Secondary | ICD-10-CM

## 2019-06-29 DIAGNOSIS — K5710 Diverticulosis of small intestine without perforation or abscess without bleeding: Secondary | ICD-10-CM | POA: Diagnosis not present

## 2019-06-29 MED ORDER — SODIUM CHLORIDE 0.9 % IV SOLN: 500.0000 mL | Freq: Once | INTRAVENOUS | Status: DC

## 2019-06-29 NOTE — Progress Notes (Signed)
A and O x3. Report to RN. Tolerated MAC anesthesia well.Teeth unchanged after procedure.

## 2019-06-29 NOTE — Patient Instructions (Signed)
YOU HAD AN ENDOSCOPIC PROCEDURE TODAY AT [facility]:   Refer to the procedure report that was given to you for any specific questions about what was found during the examination.  If the procedure report does not answer your questions, please call your gastroenterologist to clarify.  If you requested that your care partner not be given the details of your procedure findings, then the procedure report has been included in a sealed envelope for you to review at your convenience later.  YOU SHOULD EXPECT: Some feelings of bloating in the abdomen. Passage of more gas than usual.  Walking can help get rid of the air that was put into your GI tract during the procedure and reduce the bloating. If you had a lower endoscopy (such as a colonoscopy or flexible sigmoidoscopy) you may notice spotting of blood in your stool or on the toilet paper. If you underwent a bowel prep for your procedure, you may not have a normal bowel movement for a few days.  Please Note:  You might notice some irritation and congestion in your nose or some drainage.  This is from the oxygen used during your procedure.  There is no need for concern and it should clear up in a day or so.  SYMPTOMS TO REPORT IMMEDIATELY:    Following upper endoscopy (EGD)  Vomiting of blood or coffee ground material  New chest pain or pain under the shoulder blades  Painful or persistently difficult swallowing  New shortness of breath  Fever of 100F or higher  Black, tarry-looking stools  For urgent or emergent issues, a gastroenterologist can be reached at any hour by calling [phone].   DIET:  We do recommend a small meal at first, but then you may proceed to your regular diet.  Drink plenty of fluids but you should avoid alcoholic beverages for 24 hours.  ACTIVITY:  You should plan to take it easy for the rest of today and you should NOT DRIVE or use heavy machinery until tomorrow (because of the sedation medicines used  during the test).    FOLLOW UP: Our staff will call the number listed on your records 48-72 hours following your procedure to check on you and address any questions or concerns that you may have regarding the information given to you following your procedure. If we do not reach you, we will leave a message.  We will attempt to reach you two times.  During this call, we will ask if you have developed any symptoms of COVID 19. If you develop any symptoms (ie: fever, flu-like symptoms, shortness of breath, cough etc.) before then, please call [phone].  If you test positive for Covid 19 in the 2 weeks post procedure, please call and report this information to [place].    If any biopsies were taken you will be contacted by phone or by letter within the next 1-3 weeks.  Please call us at [phone] if you have not heard about the biopsies in 3 weeks.    SIGNATURES/CONFIDENTIALITY: You and/or your care partner have signed paperwork which will be entered into your electronic medical record.  These signatures attest to the fact that that the information above on your After Visit Summary has been reviewed and is understood.  Full responsibility of the confidentiality of this discharge information lies with you and/or your care-partner.   Thank you for allowing [place] to provide your healthcare today.

## 2019-06-29 NOTE — Op Note (Signed)
[facility] Patient Name: Tanner [name] Procedure Date: 06/29/2019 10:51 AM MRN: [id number] Endoscopist: [name]. [name] , MD Age: 74 Referring MD:  Date of Birth: 10/25/1945 Gender: Male Account #: [id number] Procedure:                Upper GI endoscopy Indications:              Abnormal CT of the GI tract. Question ampullary                            mass versus periampullary diverticulum Medicines:                Monitored Anesthesia Care Procedure:                Pre-Anesthesia Assessment:                           - Prior to the procedure, a History and Physical                            was performed, and patient medications and                            allergies were reviewed. The patient's tolerance of                            previous anesthesia was also reviewed. The risks                            and benefits of the procedure and the sedation                            options and risks were discussed with the patient.                            All questions were answered, and informed consent                            was obtained. Prior Anticoagulants: The patient has                            taken no previous anticoagulant or antiplatelet                            agents. ASA Grade Assessment: II - A patient with                            mild systemic disease. After reviewing the risks                            and benefits, the patient was deemed in                            satisfactory condition to undergo the procedure.  After obtaining informed consent, the endoscope was                            passed under direct vision. Throughout the                            procedure, the patient's blood pressure, pulse, and                            oxygen saturations were monitored continuously. The                            Endoscope was introduced through the mouth, and                            advanced to the second  part of duodenum. The upper                            GI endoscopy was accomplished without difficulty.                            The patient tolerated the procedure well. Scope In: Scope Out: Findings:                 The esophagus was normal.                           The stomach was normal save a small hiatal hernia                            and a few diminutive benign gastric polyps.                           The examined duodenum was normal. 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 No ampullary mass.                           The cardia and gastric fundus were normal on                            retroflexion. Complications:            No immediate complications. Estimated Blood Loss:     Estimated blood loss: none. Impression:               - Normal esophagus.                           - Essentially normal stomach.                           - Normal examined duodenum. Duodenal diverticulum                            noted.  No periampullary mass                           - No specimens collected. Recommendation:           - Patient has a contact number available for                            emergencies. The signs and symptoms of potential                            delayed complications were discussed with the                            patient. Return to normal activities tomorrow.                            Written discharge instructions were provided to the                            patient.                           - Resume previous diet.                           - Continue present medications. [name]. [name], MD 06/29/2019 11:12:04 AM This report has been signed electronically.

## 2019-06-29 NOTE — Progress Notes (Signed)
History reviewed today  Temp LC VS KA

## 2019-07-01 ENCOUNTER — Telehealth: Payer: Self-pay

## 2019-07-01 NOTE — Telephone Encounter (Signed)
No answer, unable to leave a message, B.Alesana Magistro RN. 

## 2019-07-01 NOTE — Telephone Encounter (Signed)
Called [phone] and answering machine picked up and was not able to leave a message that we tried to reach pt for a follow up call. maw

## 2019-07-15 ENCOUNTER — Ambulatory Visit: Payer: Medicare PPO | Attending: Internal Medicine

## 2019-07-15 DIAGNOSIS — Z23 Encounter for immunization: Secondary | ICD-10-CM

## 2019-07-15 NOTE — Progress Notes (Signed)
   Covid-19 Vaccination Clinic  Name:  BRYDAN DEVARY    MRN: [id number] DOB: [birth year]  07/15/2019  Mr. Walls was observed post Covid-19 immunization for 15 minutes without incidence. He was provided with Vaccine Information Sheet and instruction to access the V-Safe system.   Mr. Fetchko was instructed to call 911 with any severe reactions post vaccine: Marland Kitchen Difficulty breathing  . Swelling of your face and throat  . A fast heartbeat  . A bad rash all over your body  . Dizziness and weakness    Immunizations Administered    Name Date Dose VIS Date Route   Pfizer COVID-19 Vaccine 07/15/2019  8:33 AM 0.3 mL 05/13/2019 Intramuscular   Manufacturer: [place]   Lot: [id number]   [place]: [id number]

## 2019-07-26 ENCOUNTER — Telehealth: Payer: Self-pay | Admitting: Pulmonary Disease

## 2019-07-26 NOTE — Telephone Encounter (Signed)
Called back and advised the patient has not been seen since 2013. Confirmed the sleep study documentation came from [facility] and was from 2009 and 2010. Fax number confirmed.  Information sent.  Nothing further needed at this time.

## 2019-08-24 ENCOUNTER — Ambulatory Visit: Payer: Medicare PPO | Admitting: Dermatology

## 2019-08-24 ENCOUNTER — Encounter: Payer: Self-pay | Admitting: Dermatology

## 2019-08-24 ENCOUNTER — Other Ambulatory Visit: Payer: Self-pay

## 2019-08-24 DIAGNOSIS — L309 Dermatitis, unspecified: Secondary | ICD-10-CM

## 2019-08-24 DIAGNOSIS — L821 Other seborrheic keratosis: Secondary | ICD-10-CM

## 2019-08-24 NOTE — Patient Instructions (Addendum)
Tanner [name] has noted rather rapid enlargement of the lesion on his left upper arm.  Examination showed an inflamed monochrome brown 5 mm keratosis; dermoscopy confirmed benign keratosis.  Should this continue to grow or bleed, he will return and I will remove the lesion but I expect that this is benign.  We also discussed a several month old issue on his lateral cheeks with irritating tiny bumps that may be centered around hair follicles, but he never finds an ingrown hair.  I told Tanner [name] that I see this not uncommonly and most of the time its in adult white man.  I suspect this is multifactorial with sun damage, follicular microorganisms, and occasionally eczema all contributing.  He will initially get over-the-counter clotrimazole 1 or 2% he will find this in the athlete's foot section.  He will apply this on the involved areas once or twice daily for 3 weeks and then call me with a status report.

## 2019-08-25 NOTE — Progress Notes (Signed)
   Follow-Up Visit   Subjective  ELLSWORTH CHARLEBOIS is a 74 y.o. male who presents for the following: Skin Problem (Patient here today for growth left upper arm x 1week. Patient denies pain or bleeding, patient states it's just big.  Patient also has a question about some bumps that are popping up on his face after he's shaved x months.  Patient states that some of them are like white heads just on both cheeks.  Patient states he hasn't tried anything for the bumps).  New growth Location: left upper arm Duration: 2 months Quality: Larger Associated Signs/Symptoms: Sore Modifying Factors:  Severity:  Timing: Context: History of skin cancer  The following portions of the chart were reviewed this encounter and updated as appropriate:     Objective  Well appearing patient in no apparent distress; mood and affect are within normal limits.  A focused examination was performed including arms, head, neck. Relevant physical exam findings are noted in the Assessment and Plan. Mr. Bennetts has noted rather rapid enlargement of the lesion on his left upper arm.  Examination showed an inflamed monochrome brown 5 mm keratosis; dermoscopy confirmed benign keratosis.  Should this continue to grow or bleed, he will return and I will remove the lesion but I expect that this is benign.  We also discussed a several month old issue on his lateral cheeks with irritating tiny bumps that may be centered around hair follicles, but he never finds an ingrown hair.  I told Mr. Foster that I see this not uncommonly and most of the time its in adult white man.  I suspect this is multifactorial with sun damage, follicular microorganisms, and occasionally eczema all contributing.  He will initially get over-the-counter clotrimazole 1 or 2% he will find this in the athlete's foot section.  He will apply this on the involved areas once or twice daily for 3 weeks and then call me with a status report.  Assessment & Plan

## 2019-10-03 DIAGNOSIS — M9905 Segmental and somatic dysfunction of pelvic region: Secondary | ICD-10-CM | POA: Diagnosis not present

## 2019-10-03 DIAGNOSIS — M545 Low back pain: Secondary | ICD-10-CM | POA: Diagnosis not present

## 2019-10-03 DIAGNOSIS — M47896 Other spondylosis, lumbar region: Secondary | ICD-10-CM | POA: Diagnosis not present

## 2019-10-03 DIAGNOSIS — M9903 Segmental and somatic dysfunction of lumbar region: Secondary | ICD-10-CM | POA: Diagnosis not present

## 2019-10-05 DIAGNOSIS — M9905 Segmental and somatic dysfunction of pelvic region: Secondary | ICD-10-CM | POA: Diagnosis not present

## 2019-10-05 DIAGNOSIS — M9903 Segmental and somatic dysfunction of lumbar region: Secondary | ICD-10-CM | POA: Diagnosis not present

## 2019-10-05 DIAGNOSIS — M47896 Other spondylosis, lumbar region: Secondary | ICD-10-CM | POA: Diagnosis not present

## 2019-10-05 DIAGNOSIS — M545 Low back pain: Secondary | ICD-10-CM | POA: Diagnosis not present

## 2019-10-06 DIAGNOSIS — F4310 Post-traumatic stress disorder, unspecified: Secondary | ICD-10-CM | POA: Diagnosis not present

## 2019-10-07 DIAGNOSIS — M9903 Segmental and somatic dysfunction of lumbar region: Secondary | ICD-10-CM | POA: Diagnosis not present

## 2019-10-07 DIAGNOSIS — M545 Low back pain: Secondary | ICD-10-CM | POA: Diagnosis not present

## 2019-10-07 DIAGNOSIS — M9905 Segmental and somatic dysfunction of pelvic region: Secondary | ICD-10-CM | POA: Diagnosis not present

## 2019-10-07 DIAGNOSIS — M47896 Other spondylosis, lumbar region: Secondary | ICD-10-CM | POA: Diagnosis not present

## 2019-10-10 DIAGNOSIS — H6123 Impacted cerumen, bilateral: Secondary | ICD-10-CM | POA: Diagnosis not present

## 2019-10-10 DIAGNOSIS — M47896 Other spondylosis, lumbar region: Secondary | ICD-10-CM | POA: Diagnosis not present

## 2019-10-10 DIAGNOSIS — M9905 Segmental and somatic dysfunction of pelvic region: Secondary | ICD-10-CM | POA: Diagnosis not present

## 2019-10-10 DIAGNOSIS — M9903 Segmental and somatic dysfunction of lumbar region: Secondary | ICD-10-CM | POA: Diagnosis not present

## 2019-10-10 DIAGNOSIS — M545 Low back pain: Secondary | ICD-10-CM | POA: Diagnosis not present

## 2019-10-12 DIAGNOSIS — M9903 Segmental and somatic dysfunction of lumbar region: Secondary | ICD-10-CM | POA: Diagnosis not present

## 2019-10-12 DIAGNOSIS — M9905 Segmental and somatic dysfunction of pelvic region: Secondary | ICD-10-CM | POA: Diagnosis not present

## 2019-10-12 DIAGNOSIS — M47896 Other spondylosis, lumbar region: Secondary | ICD-10-CM | POA: Diagnosis not present

## 2019-10-12 DIAGNOSIS — M545 Low back pain: Secondary | ICD-10-CM | POA: Diagnosis not present

## 2019-10-13 DIAGNOSIS — F4310 Post-traumatic stress disorder, unspecified: Secondary | ICD-10-CM | POA: Diagnosis not present

## 2019-10-14 DIAGNOSIS — M9905 Segmental and somatic dysfunction of pelvic region: Secondary | ICD-10-CM | POA: Diagnosis not present

## 2019-10-14 DIAGNOSIS — M9903 Segmental and somatic dysfunction of lumbar region: Secondary | ICD-10-CM | POA: Diagnosis not present

## 2019-10-14 DIAGNOSIS — M545 Low back pain: Secondary | ICD-10-CM | POA: Diagnosis not present

## 2019-10-14 DIAGNOSIS — M47896 Other spondylosis, lumbar region: Secondary | ICD-10-CM | POA: Diagnosis not present

## 2019-10-17 DIAGNOSIS — M47896 Other spondylosis, lumbar region: Secondary | ICD-10-CM | POA: Diagnosis not present

## 2019-10-17 DIAGNOSIS — M9905 Segmental and somatic dysfunction of pelvic region: Secondary | ICD-10-CM | POA: Diagnosis not present

## 2019-10-17 DIAGNOSIS — M545 Low back pain: Secondary | ICD-10-CM | POA: Diagnosis not present

## 2019-10-17 DIAGNOSIS — M9903 Segmental and somatic dysfunction of lumbar region: Secondary | ICD-10-CM | POA: Diagnosis not present

## 2019-10-19 DIAGNOSIS — M47896 Other spondylosis, lumbar region: Secondary | ICD-10-CM | POA: Diagnosis not present

## 2019-10-19 DIAGNOSIS — M9903 Segmental and somatic dysfunction of lumbar region: Secondary | ICD-10-CM | POA: Diagnosis not present

## 2019-10-19 DIAGNOSIS — M9905 Segmental and somatic dysfunction of pelvic region: Secondary | ICD-10-CM | POA: Diagnosis not present

## 2019-10-19 DIAGNOSIS — M545 Low back pain: Secondary | ICD-10-CM | POA: Diagnosis not present

## 2019-10-20 DIAGNOSIS — F4310 Post-traumatic stress disorder, unspecified: Secondary | ICD-10-CM | POA: Diagnosis not present

## 2019-10-21 DIAGNOSIS — M47896 Other spondylosis, lumbar region: Secondary | ICD-10-CM | POA: Diagnosis not present

## 2019-10-21 DIAGNOSIS — M9903 Segmental and somatic dysfunction of lumbar region: Secondary | ICD-10-CM | POA: Diagnosis not present

## 2019-10-21 DIAGNOSIS — M545 Low back pain: Secondary | ICD-10-CM | POA: Diagnosis not present

## 2019-10-21 DIAGNOSIS — M9905 Segmental and somatic dysfunction of pelvic region: Secondary | ICD-10-CM | POA: Diagnosis not present

## 2019-10-25 DIAGNOSIS — S66892A Other injury of other specified muscles, fascia and tendons at wrist and hand level, left hand, initial encounter: Secondary | ICD-10-CM | POA: Diagnosis not present

## 2019-10-25 DIAGNOSIS — M25532 Pain in left wrist: Secondary | ICD-10-CM | POA: Diagnosis not present

## 2019-10-27 DIAGNOSIS — F4310 Post-traumatic stress disorder, unspecified: Secondary | ICD-10-CM | POA: Diagnosis not present

## 2019-11-02 DIAGNOSIS — M9903 Segmental and somatic dysfunction of lumbar region: Secondary | ICD-10-CM | POA: Diagnosis not present

## 2019-11-02 DIAGNOSIS — M9905 Segmental and somatic dysfunction of pelvic region: Secondary | ICD-10-CM | POA: Diagnosis not present

## 2019-11-02 DIAGNOSIS — M545 Low back pain: Secondary | ICD-10-CM | POA: Diagnosis not present

## 2019-11-02 DIAGNOSIS — M47896 Other spondylosis, lumbar region: Secondary | ICD-10-CM | POA: Diagnosis not present

## 2019-11-04 DIAGNOSIS — M9905 Segmental and somatic dysfunction of pelvic region: Secondary | ICD-10-CM | POA: Diagnosis not present

## 2019-11-04 DIAGNOSIS — M9903 Segmental and somatic dysfunction of lumbar region: Secondary | ICD-10-CM | POA: Diagnosis not present

## 2019-11-04 DIAGNOSIS — M47896 Other spondylosis, lumbar region: Secondary | ICD-10-CM | POA: Diagnosis not present

## 2019-11-04 DIAGNOSIS — M545 Low back pain: Secondary | ICD-10-CM | POA: Diagnosis not present

## 2019-11-07 DIAGNOSIS — I8312 Varicose veins of left lower extremity with inflammation: Secondary | ICD-10-CM | POA: Diagnosis not present

## 2019-11-07 DIAGNOSIS — M545 Low back pain: Secondary | ICD-10-CM | POA: Diagnosis not present

## 2019-11-07 DIAGNOSIS — S56129S Laceration of flexor muscle, fascia and tendon of unspecified finger at forearm level, sequela: Secondary | ICD-10-CM | POA: Diagnosis not present

## 2019-11-07 DIAGNOSIS — M1811 Unilateral primary osteoarthritis of first carpometacarpal joint, right hand: Secondary | ICD-10-CM | POA: Diagnosis not present

## 2019-11-07 DIAGNOSIS — M47896 Other spondylosis, lumbar region: Secondary | ICD-10-CM | POA: Diagnosis not present

## 2019-11-07 DIAGNOSIS — I83812 Varicose veins of left lower extremities with pain: Secondary | ICD-10-CM | POA: Diagnosis not present

## 2019-11-07 DIAGNOSIS — M9903 Segmental and somatic dysfunction of lumbar region: Secondary | ICD-10-CM | POA: Diagnosis not present

## 2019-11-07 DIAGNOSIS — M9905 Segmental and somatic dysfunction of pelvic region: Secondary | ICD-10-CM | POA: Diagnosis not present

## 2019-11-07 DIAGNOSIS — S61209S Unspecified open wound of unspecified finger without damage to nail, sequela: Secondary | ICD-10-CM | POA: Diagnosis not present

## 2019-11-10 DIAGNOSIS — M545 Low back pain: Secondary | ICD-10-CM | POA: Diagnosis not present

## 2019-11-10 DIAGNOSIS — M47896 Other spondylosis, lumbar region: Secondary | ICD-10-CM | POA: Diagnosis not present

## 2019-11-10 DIAGNOSIS — M9905 Segmental and somatic dysfunction of pelvic region: Secondary | ICD-10-CM | POA: Diagnosis not present

## 2019-11-10 DIAGNOSIS — M9903 Segmental and somatic dysfunction of lumbar region: Secondary | ICD-10-CM | POA: Diagnosis not present

## 2019-11-14 DIAGNOSIS — M545 Low back pain: Secondary | ICD-10-CM | POA: Diagnosis not present

## 2019-11-14 DIAGNOSIS — M47896 Other spondylosis, lumbar region: Secondary | ICD-10-CM | POA: Diagnosis not present

## 2019-11-14 DIAGNOSIS — M9903 Segmental and somatic dysfunction of lumbar region: Secondary | ICD-10-CM | POA: Diagnosis not present

## 2019-11-14 DIAGNOSIS — M9905 Segmental and somatic dysfunction of pelvic region: Secondary | ICD-10-CM | POA: Diagnosis not present

## 2019-11-17 DIAGNOSIS — I83812 Varicose veins of left lower extremities with pain: Secondary | ICD-10-CM | POA: Diagnosis not present

## 2019-11-17 DIAGNOSIS — I8312 Varicose veins of left lower extremity with inflammation: Secondary | ICD-10-CM | POA: Diagnosis not present

## 2019-11-18 DIAGNOSIS — M47896 Other spondylosis, lumbar region: Secondary | ICD-10-CM | POA: Diagnosis not present

## 2019-11-18 DIAGNOSIS — M545 Low back pain: Secondary | ICD-10-CM | POA: Diagnosis not present

## 2019-11-18 DIAGNOSIS — M9903 Segmental and somatic dysfunction of lumbar region: Secondary | ICD-10-CM | POA: Diagnosis not present

## 2019-11-18 DIAGNOSIS — M9905 Segmental and somatic dysfunction of pelvic region: Secondary | ICD-10-CM | POA: Diagnosis not present

## 2019-11-21 DIAGNOSIS — M47896 Other spondylosis, lumbar region: Secondary | ICD-10-CM | POA: Diagnosis not present

## 2019-11-21 DIAGNOSIS — M9905 Segmental and somatic dysfunction of pelvic region: Secondary | ICD-10-CM | POA: Diagnosis not present

## 2019-11-21 DIAGNOSIS — M9903 Segmental and somatic dysfunction of lumbar region: Secondary | ICD-10-CM | POA: Diagnosis not present

## 2019-11-21 DIAGNOSIS — M545 Low back pain: Secondary | ICD-10-CM | POA: Diagnosis not present

## 2019-11-24 DIAGNOSIS — I824Z2 Acute embolism and thrombosis of unspecified deep veins of left distal lower extremity: Secondary | ICD-10-CM | POA: Diagnosis not present

## 2019-11-24 DIAGNOSIS — I8312 Varicose veins of left lower extremity with inflammation: Secondary | ICD-10-CM | POA: Diagnosis not present

## 2019-11-25 DIAGNOSIS — M545 Low back pain: Secondary | ICD-10-CM | POA: Diagnosis not present

## 2019-11-25 DIAGNOSIS — M47896 Other spondylosis, lumbar region: Secondary | ICD-10-CM | POA: Diagnosis not present

## 2019-11-25 DIAGNOSIS — M9903 Segmental and somatic dysfunction of lumbar region: Secondary | ICD-10-CM | POA: Diagnosis not present

## 2019-11-25 DIAGNOSIS — M9905 Segmental and somatic dysfunction of pelvic region: Secondary | ICD-10-CM | POA: Diagnosis not present

## 2019-11-28 DIAGNOSIS — M47896 Other spondylosis, lumbar region: Secondary | ICD-10-CM | POA: Diagnosis not present

## 2019-11-28 DIAGNOSIS — M9903 Segmental and somatic dysfunction of lumbar region: Secondary | ICD-10-CM | POA: Diagnosis not present

## 2019-11-28 DIAGNOSIS — M9905 Segmental and somatic dysfunction of pelvic region: Secondary | ICD-10-CM | POA: Diagnosis not present

## 2019-11-28 DIAGNOSIS — M545 Low back pain: Secondary | ICD-10-CM | POA: Diagnosis not present

## 2019-11-29 DIAGNOSIS — M25532 Pain in left wrist: Secondary | ICD-10-CM | POA: Diagnosis not present

## 2019-11-30 DIAGNOSIS — M9905 Segmental and somatic dysfunction of pelvic region: Secondary | ICD-10-CM | POA: Diagnosis not present

## 2019-11-30 DIAGNOSIS — M47896 Other spondylosis, lumbar region: Secondary | ICD-10-CM | POA: Diagnosis not present

## 2019-11-30 DIAGNOSIS — M545 Low back pain: Secondary | ICD-10-CM | POA: Diagnosis not present

## 2019-11-30 DIAGNOSIS — M9903 Segmental and somatic dysfunction of lumbar region: Secondary | ICD-10-CM | POA: Diagnosis not present

## 2019-12-01 ENCOUNTER — Encounter: Payer: Self-pay | Admitting: Gastroenterology

## 2019-12-07 DIAGNOSIS — M25532 Pain in left wrist: Secondary | ICD-10-CM | POA: Diagnosis not present

## 2019-12-12 DIAGNOSIS — M9903 Segmental and somatic dysfunction of lumbar region: Secondary | ICD-10-CM | POA: Diagnosis not present

## 2019-12-12 DIAGNOSIS — M545 Low back pain: Secondary | ICD-10-CM | POA: Diagnosis not present

## 2019-12-12 DIAGNOSIS — M9905 Segmental and somatic dysfunction of pelvic region: Secondary | ICD-10-CM | POA: Diagnosis not present

## 2019-12-12 DIAGNOSIS — M47896 Other spondylosis, lumbar region: Secondary | ICD-10-CM | POA: Diagnosis not present

## 2019-12-15 DIAGNOSIS — M9903 Segmental and somatic dysfunction of lumbar region: Secondary | ICD-10-CM | POA: Diagnosis not present

## 2019-12-15 DIAGNOSIS — M9907 Segmental and somatic dysfunction of upper extremity: Secondary | ICD-10-CM | POA: Diagnosis not present

## 2019-12-15 DIAGNOSIS — M24132 Other articular cartilage disorders, left wrist: Secondary | ICD-10-CM | POA: Diagnosis not present

## 2019-12-15 DIAGNOSIS — M545 Low back pain: Secondary | ICD-10-CM | POA: Diagnosis not present

## 2019-12-15 DIAGNOSIS — M47896 Other spondylosis, lumbar region: Secondary | ICD-10-CM | POA: Diagnosis not present

## 2019-12-15 DIAGNOSIS — M9905 Segmental and somatic dysfunction of pelvic region: Secondary | ICD-10-CM | POA: Diagnosis not present

## 2019-12-15 DIAGNOSIS — S638X2D Sprain of other part of left wrist and hand, subsequent encounter: Secondary | ICD-10-CM | POA: Diagnosis not present

## 2019-12-15 DIAGNOSIS — M9902 Segmental and somatic dysfunction of thoracic region: Secondary | ICD-10-CM | POA: Diagnosis not present

## 2019-12-20 ENCOUNTER — Encounter: Payer: Self-pay | Admitting: Dermatology

## 2019-12-20 ENCOUNTER — Ambulatory Visit: Payer: Medicare PPO | Admitting: Dermatology

## 2019-12-20 ENCOUNTER — Other Ambulatory Visit: Payer: Self-pay

## 2019-12-20 DIAGNOSIS — L821 Other seborrheic keratosis: Secondary | ICD-10-CM | POA: Diagnosis not present

## 2019-12-20 DIAGNOSIS — M9907 Segmental and somatic dysfunction of upper extremity: Secondary | ICD-10-CM | POA: Diagnosis not present

## 2019-12-20 DIAGNOSIS — M545 Low back pain: Secondary | ICD-10-CM | POA: Diagnosis not present

## 2019-12-20 DIAGNOSIS — M47896 Other spondylosis, lumbar region: Secondary | ICD-10-CM | POA: Diagnosis not present

## 2019-12-20 DIAGNOSIS — M9902 Segmental and somatic dysfunction of thoracic region: Secondary | ICD-10-CM | POA: Diagnosis not present

## 2019-12-20 DIAGNOSIS — M9905 Segmental and somatic dysfunction of pelvic region: Secondary | ICD-10-CM | POA: Diagnosis not present

## 2019-12-20 DIAGNOSIS — D692 Other nonthrombocytopenic purpura: Secondary | ICD-10-CM | POA: Diagnosis not present

## 2019-12-20 DIAGNOSIS — M9903 Segmental and somatic dysfunction of lumbar region: Secondary | ICD-10-CM | POA: Diagnosis not present

## 2019-12-20 NOTE — Patient Instructions (Addendum)
Follow-up today for Tanner [name] date of birth 1945-12-05 prompted by growth/enlargement of a lesion on his left side.  This two-tone brown with white dots 6 mm elevated keratotic papule is a typical seborrheic keratosis; dermoscopy confirms.  Unless there is obvious clinical change this does not require watching or biopsy.  We also discussed his increasingly fragile and bruising forearms which is solar purpura he may get a cream called Dermend or Goldbond strength and resilience cream and apply it to the area prone to bruise daily after showering for a month perhaps 50-60% of people report less bruising, but there is no excellent scientific study.

## 2019-12-22 DIAGNOSIS — N4340 Spermatocele of epididymis, unspecified: Secondary | ICD-10-CM | POA: Diagnosis not present

## 2019-12-22 DIAGNOSIS — N521 Erectile dysfunction due to diseases classified elsewhere: Secondary | ICD-10-CM | POA: Diagnosis not present

## 2019-12-22 DIAGNOSIS — N312 Flaccid neuropathic bladder, not elsewhere classified: Secondary | ICD-10-CM | POA: Diagnosis not present

## 2019-12-23 DIAGNOSIS — M9903 Segmental and somatic dysfunction of lumbar region: Secondary | ICD-10-CM | POA: Diagnosis not present

## 2019-12-23 DIAGNOSIS — M9907 Segmental and somatic dysfunction of upper extremity: Secondary | ICD-10-CM | POA: Diagnosis not present

## 2019-12-23 DIAGNOSIS — M545 Low back pain: Secondary | ICD-10-CM | POA: Diagnosis not present

## 2019-12-23 DIAGNOSIS — M9902 Segmental and somatic dysfunction of thoracic region: Secondary | ICD-10-CM | POA: Diagnosis not present

## 2019-12-28 DIAGNOSIS — M9902 Segmental and somatic dysfunction of thoracic region: Secondary | ICD-10-CM | POA: Diagnosis not present

## 2019-12-28 DIAGNOSIS — M9907 Segmental and somatic dysfunction of upper extremity: Secondary | ICD-10-CM | POA: Diagnosis not present

## 2019-12-28 DIAGNOSIS — M9903 Segmental and somatic dysfunction of lumbar region: Secondary | ICD-10-CM | POA: Diagnosis not present

## 2019-12-28 DIAGNOSIS — M545 Low back pain: Secondary | ICD-10-CM | POA: Diagnosis not present

## 2019-12-30 DIAGNOSIS — M545 Low back pain: Secondary | ICD-10-CM | POA: Diagnosis not present

## 2019-12-30 DIAGNOSIS — M9903 Segmental and somatic dysfunction of lumbar region: Secondary | ICD-10-CM | POA: Diagnosis not present

## 2019-12-30 DIAGNOSIS — M9907 Segmental and somatic dysfunction of upper extremity: Secondary | ICD-10-CM | POA: Diagnosis not present

## 2019-12-30 DIAGNOSIS — M9902 Segmental and somatic dysfunction of thoracic region: Secondary | ICD-10-CM | POA: Diagnosis not present

## 2020-01-10 DIAGNOSIS — F4310 Post-traumatic stress disorder, unspecified: Secondary | ICD-10-CM | POA: Diagnosis not present

## 2020-01-16 DIAGNOSIS — F4310 Post-traumatic stress disorder, unspecified: Secondary | ICD-10-CM | POA: Diagnosis not present

## 2020-01-23 ENCOUNTER — Encounter: Payer: Self-pay | Admitting: Dermatology

## 2020-01-23 DIAGNOSIS — F4310 Post-traumatic stress disorder, unspecified: Secondary | ICD-10-CM | POA: Diagnosis not present

## 2020-01-23 NOTE — Progress Notes (Signed)
   Follow-Up Visit   Subjective  Tanner [name] is a 74 y.o. male who presents for the following: Skin Problem (new spot on left side, noticed about 2 weeks ago, denies itch).  Growth Location: Left side Duration: Just 1 to 2 months. Quality:  Associated Signs/Symptoms: Modifying Factors:  Severity:  Timing: Context:   Objective  Well appearing patient in no apparent distress; mood and affect are within normal limits.  All skin waist up examined.   Assessment & Plan    Seborrheic keratosis Left Abdomen (side) - Upper  Leave if stable  Senile purpura (HCC) (2) Left Forearm - Posterior; Right Forearm - Posterior  May try over-the-counter Dermend daily after bathing.  He understands that not every body response to this and that it should be applied to areas prone to bruise rather than just to the bruise itself.     I, [name], MD, have reviewed all documentation for this visit.  The documentation on 01/23/20 for the exam, diagnosis, procedures, and orders are all accurate and complete.

## 2020-01-25 DIAGNOSIS — E559 Vitamin D deficiency, unspecified: Secondary | ICD-10-CM | POA: Diagnosis not present

## 2020-01-25 DIAGNOSIS — Z79899 Other long term (current) drug therapy: Secondary | ICD-10-CM | POA: Diagnosis not present

## 2020-01-25 DIAGNOSIS — E7800 Pure hypercholesterolemia, unspecified: Secondary | ICD-10-CM | POA: Diagnosis not present

## 2020-01-30 DIAGNOSIS — Z23 Encounter for immunization: Secondary | ICD-10-CM | POA: Diagnosis not present

## 2020-01-30 DIAGNOSIS — E559 Vitamin D deficiency, unspecified: Secondary | ICD-10-CM | POA: Diagnosis not present

## 2020-01-30 DIAGNOSIS — F909 Attention-deficit hyperactivity disorder, unspecified type: Secondary | ICD-10-CM | POA: Diagnosis not present

## 2020-01-30 DIAGNOSIS — Z0001 Encounter for general adult medical examination with abnormal findings: Secondary | ICD-10-CM | POA: Diagnosis not present

## 2020-01-30 DIAGNOSIS — E7800 Pure hypercholesterolemia, unspecified: Secondary | ICD-10-CM | POA: Diagnosis not present

## 2020-01-30 DIAGNOSIS — K219 Gastro-esophageal reflux disease without esophagitis: Secondary | ICD-10-CM | POA: Diagnosis not present

## 2020-01-30 DIAGNOSIS — N400 Enlarged prostate without lower urinary tract symptoms: Secondary | ICD-10-CM | POA: Diagnosis not present

## 2020-01-30 DIAGNOSIS — F331 Major depressive disorder, recurrent, moderate: Secondary | ICD-10-CM | POA: Diagnosis not present

## 2020-02-01 DIAGNOSIS — F4310 Post-traumatic stress disorder, unspecified: Secondary | ICD-10-CM | POA: Diagnosis not present

## 2020-02-09 ENCOUNTER — Encounter: Payer: Self-pay | Admitting: Internal Medicine

## 2020-02-15 ENCOUNTER — Other Ambulatory Visit: Payer: Self-pay

## 2020-02-15 ENCOUNTER — Encounter: Payer: Self-pay | Admitting: Dermatology

## 2020-02-15 ENCOUNTER — Ambulatory Visit: Payer: Medicare PPO | Admitting: Dermatology

## 2020-02-15 DIAGNOSIS — L309 Dermatitis, unspecified: Secondary | ICD-10-CM | POA: Diagnosis not present

## 2020-02-15 DIAGNOSIS — F4310 Post-traumatic stress disorder, unspecified: Secondary | ICD-10-CM | POA: Diagnosis not present

## 2020-02-15 DIAGNOSIS — L570 Actinic keratosis: Secondary | ICD-10-CM

## 2020-02-15 MED ORDER — HYDROCORTISONE 2.5 % EX OINT: TOPICAL_OINTMENT | Freq: Two times a day (BID) | CUTANEOUS | 2 refills | Status: DC

## 2020-02-28 DIAGNOSIS — F4310 Post-traumatic stress disorder, unspecified: Secondary | ICD-10-CM | POA: Diagnosis not present

## 2020-03-07 DIAGNOSIS — F4310 Post-traumatic stress disorder, unspecified: Secondary | ICD-10-CM | POA: Diagnosis not present

## 2020-03-12 NOTE — Progress Notes (Signed)
   Follow-Up Visit   Subjective  Tanner [name] is a 74 y.o. male who presents for the following: Follow-up (6 mth- forehead & scalp- itches & red).  Crusts leg, rash upper forehead scalp Location:  Duration:  Quality:  Associated Signs/Symptoms: Modifying Factors:  Severity:  Timing: Context:   Objective  Well appearing patient in no apparent distress; mood and affect are within normal limits.  A focused examination was performed including Head, neck, arms, legs.. Relevant physical exam findings are noted in the Assessment and Plan.   Assessment & Plan    Dermatitis Head - Anterior (Face)  Generic Hytone 2-1/2% cream nightly for 4 to 6 weeks and then follow-up by MyChart or phone  AK (actinic keratosis) Left Lower Leg - Posterior  Return for biopsy if freezing is not helpful.  Destruction of lesion - Left Lower Leg - Posterior Complexity: simple   Destruction method: cryotherapy   Informed consent: discussed and consent obtained   Timeout:  patient name, date of birth, surgical site, and procedure verified Anesthesia: the lesion was anesthetized in a standard fashion   Anesthetic:  1% lidocaine w/ epinephrine 1-100,000 local infiltration Lesion destroyed using liquid nitrogen: Yes   Region frozen until ice ball extended beyond lesion: Yes   Cryotherapy cycles:  5 Hemostasis achieved with:  ferric subsulfate Outcome: patient tolerated procedure well with no complications   Post-procedure details: wound care instructions given   Additional details:  Inoculated with parenteral 5% fluorouracil     I, [name], MD, have reviewed all documentation for this visit.  The documentation on 03/12/20 for the exam, diagnosis, procedures, and orders are all accurate and complete.

## 2020-03-14 ENCOUNTER — Telehealth: Payer: Self-pay | Admitting: Dermatology

## 2020-03-14 NOTE — Telephone Encounter (Signed)
Patient calling to give update on eyebrows and forehead. Still itchy and red. He states that if you send in a alternate medication please send it to [facility] on [address].

## 2020-03-15 DIAGNOSIS — F4310 Post-traumatic stress disorder, unspecified: Secondary | ICD-10-CM | POA: Diagnosis not present

## 2020-03-15 MED ORDER — TACROLIMUS 0.1 % EX OINT: TOPICAL_OINTMENT | Freq: Two times a day (BID) | CUTANEOUS | 2 refills | Status: DC

## 2020-03-15 NOTE — Telephone Encounter (Signed)
Sent to patient pharmacy

## 2020-03-15 NOTE — Telephone Encounter (Signed)
If covered, next step is generic tacrolimus 0.3 ointment nightly for four weeks.

## 2020-03-19 ENCOUNTER — Encounter (INDEPENDENT_AMBULATORY_CARE_PROVIDER_SITE_OTHER): Payer: Self-pay

## 2020-03-21 DIAGNOSIS — F4310 Post-traumatic stress disorder, unspecified: Secondary | ICD-10-CM | POA: Diagnosis not present

## 2020-03-29 DIAGNOSIS — F4310 Post-traumatic stress disorder, unspecified: Secondary | ICD-10-CM | POA: Diagnosis not present

## 2020-04-09 DIAGNOSIS — F4310 Post-traumatic stress disorder, unspecified: Secondary | ICD-10-CM | POA: Diagnosis not present

## 2020-04-16 DIAGNOSIS — F4310 Post-traumatic stress disorder, unspecified: Secondary | ICD-10-CM | POA: Diagnosis not present

## 2020-04-17 ENCOUNTER — Ambulatory Visit: Payer: Medicare PPO

## 2020-05-01 ENCOUNTER — Encounter: Payer: Medicare PPO | Admitting: Internal Medicine

## 2020-05-02 DIAGNOSIS — F4310 Post-traumatic stress disorder, unspecified: Secondary | ICD-10-CM | POA: Diagnosis not present

## 2020-05-10 DIAGNOSIS — F4310 Post-traumatic stress disorder, unspecified: Secondary | ICD-10-CM | POA: Diagnosis not present

## 2020-05-14 DIAGNOSIS — F4310 Post-traumatic stress disorder, unspecified: Secondary | ICD-10-CM | POA: Diagnosis not present

## 2020-05-28 DIAGNOSIS — F4310 Post-traumatic stress disorder, unspecified: Secondary | ICD-10-CM | POA: Diagnosis not present

## 2020-05-29 DIAGNOSIS — Z20822 Contact with and (suspected) exposure to covid-19: Secondary | ICD-10-CM | POA: Diagnosis not present

## 2020-06-04 ENCOUNTER — Other Ambulatory Visit: Payer: Self-pay

## 2020-06-04 ENCOUNTER — Ambulatory Visit (AMBULATORY_SURGERY_CENTER): Payer: Self-pay

## 2020-06-04 VITALS — Ht 70.5 in | Wt 175.0 lb

## 2020-06-04 DIAGNOSIS — Z86010 Personal history of colonic polyps: Secondary | ICD-10-CM

## 2020-06-04 MED ORDER — NA SULFATE-K SULFATE-MG SULF 17.5-3.13-1.6 GM/177ML PO SOLN: 1.0000 | Freq: Once | ORAL | 0 refills | Status: AC

## 2020-06-04 NOTE — Progress Notes (Signed)
No egg or soy allergy known to patient  °No issues with past sedation with any surgeries or procedures °No intubation problems in the past  °No FH of Malignant Hyperthermia °No diet pills per patient °No home 02 use per patient  °No blood thinners per patient  °Pt denies issues with constipation  °No A fib or A flutter  °EMMI video to pt or via MyChart  °COVID 19 guidelines implemented in PV today with Pt and RN  °Pt is fully vaccinated  for Covid  ° °Suprep Coupon given to pt in PV today , Code to Pharmacy  ° °Due to the COVID-19 pandemic we are asking patients to follow certain guidelines.  °Pt aware of COVID protocols and [facility] guidelines  ° °

## 2020-06-05 DIAGNOSIS — F4310 Post-traumatic stress disorder, unspecified: Secondary | ICD-10-CM | POA: Diagnosis not present

## 2020-06-07 ENCOUNTER — Encounter: Payer: Self-pay | Admitting: Internal Medicine

## 2020-06-12 ENCOUNTER — Encounter: Payer: Self-pay | Admitting: Internal Medicine

## 2020-06-12 ENCOUNTER — Other Ambulatory Visit: Payer: Self-pay

## 2020-06-12 ENCOUNTER — Ambulatory Visit (AMBULATORY_SURGERY_CENTER): Payer: Medicare PPO | Admitting: Internal Medicine

## 2020-06-12 VITALS — BP 130/62 | HR 53 | Temp 96.8°F | Resp 10 | Ht 70.0 in | Wt 175.0 lb

## 2020-06-12 DIAGNOSIS — Z86010 Personal history of colonic polyps: Secondary | ICD-10-CM | POA: Diagnosis not present

## 2020-06-12 DIAGNOSIS — Z1211 Encounter for screening for malignant neoplasm of colon: Secondary | ICD-10-CM | POA: Diagnosis not present

## 2020-06-12 MED ORDER — SODIUM CHLORIDE 0.9 % IV SOLN: 500.0000 mL | Freq: Once | INTRAVENOUS | Status: DC

## 2020-06-12 MED ORDER — FLEET ENEMA 7-19 GM/118ML RE ENEM
1.0000 | ENEMA | Freq: Once | RECTAL | Status: AC
  Administered 2020-06-12 (×2): 1 via RECTAL

## 2020-06-12 NOTE — Progress Notes (Signed)
A and O x3. Report to RN. Tolerated MAC anesthesia well.

## 2020-06-12 NOTE — Progress Notes (Signed)
Pt's states no medical or surgical changes since previsit or office visit.  KW iv and SP vitals.  PT with cloudy brown liquid, enema given per Dr. Blanch Media order. After enema results were clear yellow liquid.

## 2020-06-12 NOTE — Progress Notes (Signed)
Given per pt

## 2020-06-12 NOTE — Op Note (Signed)
[facility] Patient Name: Tanner [name] Procedure Date: 06/12/2020 8:04 AM MRN: [id number] Endoscopist: Wilhemina Bonito. Tanner [name] , MD Age: 75 Referring MD:  Date of Birth: Apr 01, 1946 Gender: Male Account #: [id number] Procedure:                Colonoscopy Indications:              High risk colon cancer surveillance: Personal                            history of non-advanced adenoma. Previous                            examinations 2006 Tanner [name]); 2016 Medicines:                Monitored Anesthesia Care Procedure:                Pre-Anesthesia Assessment:                           - Prior to the procedure, a History and Physical                            was performed, and patient medications and                            allergies were reviewed. The patient's tolerance of                            previous anesthesia was also reviewed. The risks                            and benefits of the procedure and the sedation                            options and risks were discussed with the patient.                            All questions were answered, and informed consent                            was obtained. Prior Anticoagulants: The patient has                            taken no previous anticoagulant or antiplatelet                            agents. ASA Grade Assessment: II - A patient with                            mild systemic disease. After reviewing the risks                            and benefits, the patient was deemed in  satisfactory condition to undergo the procedure.                           After obtaining informed consent, the colonoscope                            was passed under direct vision. Throughout the                            procedure, the patient's blood pressure, pulse, and                            oxygen saturations were monitored continuously. The                            Colonoscope was introduced through the anus  and                            advanced to the the cecum, identified by                            appendiceal orifice and ileocecal valve. The                            ileocecal valve, appendiceal orifice, and rectum                            were photographed. The quality of the bowel                            preparation was excellent. The colonoscopy was                            performed without difficulty. The patient tolerated                            the procedure well. The bowel preparation used was                            SUPREP via split dose instruction. Scope In: 8:36:03 AM Scope Out: 8:48:30 AM Scope Withdrawal Time: 0 hours 9 minutes 15 seconds  Total Procedure Duration: 0 hours 12 minutes 27 seconds  Findings:                 A few small-mouthed diverticula were found in the                            sigmoid colon.                           Internal hemorrhoids were found during                            retroflexion. The hemorrhoids were moderate.  The exam was otherwise without abnormality on                            direct and retroflexion views. Complications:            No immediate complications. Estimated blood loss:                            None. Estimated Blood Loss:     Estimated blood loss: none. Impression:               - Diverticulosis in the sigmoid colon.                           - Internal hemorrhoids.                           - The examination was otherwise normal on direct                            and retroflexion views.                           - No specimens collected. Recommendation:           - Repeat colonoscopy is not recommended for                            surveillance.                           - Patient has a contact number available for                            emergencies. The signs and symptoms of potential                            delayed complications were discussed with the                             patient. Return to normal activities tomorrow.                            Written discharge instructions were provided to the                            patient.                           - Resume previous diet.                           - Continue present medications. Tanner [name]. Tanner Pastor, MD 06/12/2020 9:05:29 AM This report has been signed electronically.

## 2020-06-12 NOTE — Patient Instructions (Signed)
HANDOUTS PROVIDED ON: DIVERTICULOSIS & HEMORRHOIDS  You may resume your previous diet and medication schedule.  Thank you for allowing [place] to care for you today!!!   YOU HAD AN ENDOSCOPIC PROCEDURE TODAY AT [facility]:   Refer to the procedure report that was given to you for any specific questions about what was found during the examination.  If the procedure report does not answer your questions, please call your gastroenterologist to clarify.  If you requested that your care partner not be given the details of your procedure findings, then the procedure report has been included in a sealed envelope for you to review at your convenience later.  YOU SHOULD EXPECT: Some feelings of bloating in the abdomen. Passage of more gas than usual.  Walking can help get rid of the air that was put into your GI tract during the procedure and reduce the bloating. If you had a lower endoscopy (such as a colonoscopy or flexible sigmoidoscopy) you may notice spotting of blood in your stool or on the toilet paper. If you underwent a bowel prep for your procedure, you may not have a normal bowel movement for a few days.  Please Note:  You might notice some irritation and congestion in your nose or some drainage.  This is from the oxygen used during your procedure.  There is no need for concern and it should clear up in a day or so.  SYMPTOMS TO REPORT IMMEDIATELY:   Following lower endoscopy (colonoscopy or flexible sigmoidoscopy):  Excessive amounts of blood in the stool  Significant tenderness or worsening of abdominal pains  Swelling of the abdomen that is new, acute  Fever of 100F or higher  For urgent or emergent issues, a gastroenterologist can be reached at any hour by calling [phone]. Do not use MyChart messaging for urgent concerns.    DIET:  We do recommend a small meal at first, but then you may proceed to your regular diet.  Drink plenty of fluids but you should avoid  alcoholic beverages for 24 hours.  ACTIVITY:  You should plan to take it easy for the rest of today and you should NOT DRIVE or use heavy machinery until tomorrow (because of the sedation medicines used during the test).    FOLLOW UP: Our staff will call the number listed on your records Thursday morning between 7:15 am and 8:15 am to check on you and address any questions or concerns that you may have regarding the information given to you following your procedure. If we do not reach you, we will leave a message.  We will attempt to reach you two times.  During this call, we will ask if you have developed any symptoms of COVID 19. If you develop any symptoms (ie: fever, flu-like symptoms, shortness of breath, cough etc.) before then, please call [phone].  If you test positive for Covid 19 in the 2 weeks post procedure, please call and report this information to [place].    If any biopsies were taken you will be contacted by phone or by letter within the next 1-3 weeks.  Please call us at [phone] if you have not heard about the biopsies in 3 weeks.    SIGNATURES/CONFIDENTIALITY: You and/or your care partner have signed paperwork which will be entered into your electronic medical record.  These signatures attest to the fact that that the information above on your After Visit Summary has been reviewed and is understood.  Full responsibility  of the confidentiality of this discharge information lies with you and/or your care-partner.

## 2020-06-14 ENCOUNTER — Telehealth: Payer: Self-pay | Admitting: *Deleted

## 2020-06-14 ENCOUNTER — Telehealth: Payer: Self-pay

## 2020-06-14 DIAGNOSIS — H6123 Impacted cerumen, bilateral: Secondary | ICD-10-CM | POA: Diagnosis not present

## 2020-06-14 DIAGNOSIS — F4310 Post-traumatic stress disorder, unspecified: Secondary | ICD-10-CM | POA: Diagnosis not present

## 2020-06-14 NOTE — Telephone Encounter (Signed)
No answer, unable to leave a message, B.Rio Kidane RN. 

## 2020-06-14 NOTE — Telephone Encounter (Signed)
No answer for post procedure call back. Unable to leave message. Will call back. 

## 2020-06-21 DIAGNOSIS — H524 Presbyopia: Secondary | ICD-10-CM | POA: Diagnosis not present

## 2020-06-21 DIAGNOSIS — H26491 Other secondary cataract, right eye: Secondary | ICD-10-CM | POA: Diagnosis not present

## 2020-06-21 DIAGNOSIS — H5213 Myopia, bilateral: Secondary | ICD-10-CM | POA: Diagnosis not present

## 2020-06-21 DIAGNOSIS — H52203 Unspecified astigmatism, bilateral: Secondary | ICD-10-CM | POA: Diagnosis not present

## 2020-06-28 DIAGNOSIS — F4310 Post-traumatic stress disorder, unspecified: Secondary | ICD-10-CM | POA: Diagnosis not present

## 2020-07-02 DIAGNOSIS — Z20822 Contact with and (suspected) exposure to covid-19: Secondary | ICD-10-CM | POA: Diagnosis not present

## 2020-07-13 DIAGNOSIS — F4310 Post-traumatic stress disorder, unspecified: Secondary | ICD-10-CM | POA: Diagnosis not present

## 2020-07-17 DIAGNOSIS — H938X3 Other specified disorders of ear, bilateral: Secondary | ICD-10-CM | POA: Diagnosis not present

## 2020-07-18 DIAGNOSIS — M9904 Segmental and somatic dysfunction of sacral region: Secondary | ICD-10-CM | POA: Diagnosis not present

## 2020-07-18 DIAGNOSIS — M9901 Segmental and somatic dysfunction of cervical region: Secondary | ICD-10-CM | POA: Diagnosis not present

## 2020-07-18 DIAGNOSIS — M9903 Segmental and somatic dysfunction of lumbar region: Secondary | ICD-10-CM | POA: Diagnosis not present

## 2020-07-18 DIAGNOSIS — M9902 Segmental and somatic dysfunction of thoracic region: Secondary | ICD-10-CM | POA: Diagnosis not present

## 2020-07-18 DIAGNOSIS — M545 Low back pain, unspecified: Secondary | ICD-10-CM | POA: Diagnosis not present

## 2020-07-19 DIAGNOSIS — F4310 Post-traumatic stress disorder, unspecified: Secondary | ICD-10-CM | POA: Diagnosis not present

## 2020-07-20 DIAGNOSIS — M545 Low back pain, unspecified: Secondary | ICD-10-CM | POA: Diagnosis not present

## 2020-07-20 DIAGNOSIS — M9901 Segmental and somatic dysfunction of cervical region: Secondary | ICD-10-CM | POA: Diagnosis not present

## 2020-07-20 DIAGNOSIS — M9904 Segmental and somatic dysfunction of sacral region: Secondary | ICD-10-CM | POA: Diagnosis not present

## 2020-07-20 DIAGNOSIS — M9903 Segmental and somatic dysfunction of lumbar region: Secondary | ICD-10-CM | POA: Diagnosis not present

## 2020-07-20 DIAGNOSIS — M9902 Segmental and somatic dysfunction of thoracic region: Secondary | ICD-10-CM | POA: Diagnosis not present

## 2020-07-23 DIAGNOSIS — M9901 Segmental and somatic dysfunction of cervical region: Secondary | ICD-10-CM | POA: Diagnosis not present

## 2020-07-23 DIAGNOSIS — M9903 Segmental and somatic dysfunction of lumbar region: Secondary | ICD-10-CM | POA: Diagnosis not present

## 2020-07-23 DIAGNOSIS — M9907 Segmental and somatic dysfunction of upper extremity: Secondary | ICD-10-CM | POA: Diagnosis not present

## 2020-07-23 DIAGNOSIS — M9902 Segmental and somatic dysfunction of thoracic region: Secondary | ICD-10-CM | POA: Diagnosis not present

## 2020-07-25 DIAGNOSIS — M9904 Segmental and somatic dysfunction of sacral region: Secondary | ICD-10-CM | POA: Diagnosis not present

## 2020-07-25 DIAGNOSIS — M9907 Segmental and somatic dysfunction of upper extremity: Secondary | ICD-10-CM | POA: Diagnosis not present

## 2020-07-25 DIAGNOSIS — M9902 Segmental and somatic dysfunction of thoracic region: Secondary | ICD-10-CM | POA: Diagnosis not present

## 2020-07-25 DIAGNOSIS — M9901 Segmental and somatic dysfunction of cervical region: Secondary | ICD-10-CM | POA: Diagnosis not present

## 2020-07-25 DIAGNOSIS — M9903 Segmental and somatic dysfunction of lumbar region: Secondary | ICD-10-CM | POA: Diagnosis not present

## 2020-07-26 DIAGNOSIS — F4310 Post-traumatic stress disorder, unspecified: Secondary | ICD-10-CM | POA: Diagnosis not present

## 2020-07-27 DIAGNOSIS — M9907 Segmental and somatic dysfunction of upper extremity: Secondary | ICD-10-CM | POA: Diagnosis not present

## 2020-07-27 DIAGNOSIS — M9901 Segmental and somatic dysfunction of cervical region: Secondary | ICD-10-CM | POA: Diagnosis not present

## 2020-07-27 DIAGNOSIS — M9903 Segmental and somatic dysfunction of lumbar region: Secondary | ICD-10-CM | POA: Diagnosis not present

## 2020-07-27 DIAGNOSIS — M9904 Segmental and somatic dysfunction of sacral region: Secondary | ICD-10-CM | POA: Diagnosis not present

## 2020-07-27 DIAGNOSIS — M9902 Segmental and somatic dysfunction of thoracic region: Secondary | ICD-10-CM | POA: Diagnosis not present

## 2020-07-30 DIAGNOSIS — M9903 Segmental and somatic dysfunction of lumbar region: Secondary | ICD-10-CM | POA: Diagnosis not present

## 2020-07-30 DIAGNOSIS — M9902 Segmental and somatic dysfunction of thoracic region: Secondary | ICD-10-CM | POA: Diagnosis not present

## 2020-07-30 DIAGNOSIS — M25562 Pain in left knee: Secondary | ICD-10-CM | POA: Diagnosis not present

## 2020-07-30 DIAGNOSIS — M9901 Segmental and somatic dysfunction of cervical region: Secondary | ICD-10-CM | POA: Diagnosis not present

## 2020-07-30 DIAGNOSIS — M25561 Pain in right knee: Secondary | ICD-10-CM | POA: Diagnosis not present

## 2020-08-01 DIAGNOSIS — M9902 Segmental and somatic dysfunction of thoracic region: Secondary | ICD-10-CM | POA: Diagnosis not present

## 2020-08-01 DIAGNOSIS — M9903 Segmental and somatic dysfunction of lumbar region: Secondary | ICD-10-CM | POA: Diagnosis not present

## 2020-08-01 DIAGNOSIS — M9901 Segmental and somatic dysfunction of cervical region: Secondary | ICD-10-CM | POA: Diagnosis not present

## 2020-08-01 DIAGNOSIS — M25561 Pain in right knee: Secondary | ICD-10-CM | POA: Diagnosis not present

## 2020-08-01 DIAGNOSIS — M25562 Pain in left knee: Secondary | ICD-10-CM | POA: Diagnosis not present

## 2020-08-03 DIAGNOSIS — M9903 Segmental and somatic dysfunction of lumbar region: Secondary | ICD-10-CM | POA: Diagnosis not present

## 2020-08-03 DIAGNOSIS — M25561 Pain in right knee: Secondary | ICD-10-CM | POA: Diagnosis not present

## 2020-08-03 DIAGNOSIS — M9901 Segmental and somatic dysfunction of cervical region: Secondary | ICD-10-CM | POA: Diagnosis not present

## 2020-08-03 DIAGNOSIS — M9902 Segmental and somatic dysfunction of thoracic region: Secondary | ICD-10-CM | POA: Diagnosis not present

## 2020-08-03 DIAGNOSIS — M25562 Pain in left knee: Secondary | ICD-10-CM | POA: Diagnosis not present

## 2020-08-13 DIAGNOSIS — M9901 Segmental and somatic dysfunction of cervical region: Secondary | ICD-10-CM | POA: Diagnosis not present

## 2020-08-13 DIAGNOSIS — M9903 Segmental and somatic dysfunction of lumbar region: Secondary | ICD-10-CM | POA: Diagnosis not present

## 2020-08-13 DIAGNOSIS — M9902 Segmental and somatic dysfunction of thoracic region: Secondary | ICD-10-CM | POA: Diagnosis not present

## 2020-08-16 DIAGNOSIS — F4310 Post-traumatic stress disorder, unspecified: Secondary | ICD-10-CM | POA: Diagnosis not present

## 2020-08-20 DIAGNOSIS — M9902 Segmental and somatic dysfunction of thoracic region: Secondary | ICD-10-CM | POA: Diagnosis not present

## 2020-08-20 DIAGNOSIS — M9901 Segmental and somatic dysfunction of cervical region: Secondary | ICD-10-CM | POA: Diagnosis not present

## 2020-08-20 DIAGNOSIS — M9903 Segmental and somatic dysfunction of lumbar region: Secondary | ICD-10-CM | POA: Diagnosis not present

## 2020-08-20 DIAGNOSIS — F4310 Post-traumatic stress disorder, unspecified: Secondary | ICD-10-CM | POA: Diagnosis not present

## 2020-08-27 DIAGNOSIS — M9902 Segmental and somatic dysfunction of thoracic region: Secondary | ICD-10-CM | POA: Diagnosis not present

## 2020-08-27 DIAGNOSIS — M9903 Segmental and somatic dysfunction of lumbar region: Secondary | ICD-10-CM | POA: Diagnosis not present

## 2020-08-27 DIAGNOSIS — M9901 Segmental and somatic dysfunction of cervical region: Secondary | ICD-10-CM | POA: Diagnosis not present

## 2020-08-30 DIAGNOSIS — S83281A Other tear of lateral meniscus, current injury, right knee, initial encounter: Secondary | ICD-10-CM | POA: Diagnosis not present

## 2020-08-30 DIAGNOSIS — F4310 Post-traumatic stress disorder, unspecified: Secondary | ICD-10-CM | POA: Diagnosis not present

## 2020-08-30 DIAGNOSIS — S83241A Other tear of medial meniscus, current injury, right knee, initial encounter: Secondary | ICD-10-CM | POA: Diagnosis not present

## 2020-09-04 DIAGNOSIS — M23221 Derangement of posterior horn of medial meniscus due to old tear or injury, right knee: Secondary | ICD-10-CM | POA: Diagnosis not present

## 2020-09-04 DIAGNOSIS — M23321 Other meniscus derangements, posterior horn of medial meniscus, right knee: Secondary | ICD-10-CM | POA: Diagnosis not present

## 2020-09-04 DIAGNOSIS — M958 Other specified acquired deformities of musculoskeletal system: Secondary | ICD-10-CM | POA: Diagnosis not present

## 2020-09-04 DIAGNOSIS — G8918 Other acute postprocedural pain: Secondary | ICD-10-CM | POA: Diagnosis not present

## 2020-09-04 DIAGNOSIS — M23241 Derangement of anterior horn of lateral meniscus due to old tear or injury, right knee: Secondary | ICD-10-CM | POA: Diagnosis not present

## 2020-09-13 DIAGNOSIS — F4310 Post-traumatic stress disorder, unspecified: Secondary | ICD-10-CM | POA: Diagnosis not present

## 2020-09-22 DIAGNOSIS — Z23 Encounter for immunization: Secondary | ICD-10-CM | POA: Diagnosis not present

## 2020-09-24 DIAGNOSIS — M9903 Segmental and somatic dysfunction of lumbar region: Secondary | ICD-10-CM | POA: Diagnosis not present

## 2020-09-24 DIAGNOSIS — M9902 Segmental and somatic dysfunction of thoracic region: Secondary | ICD-10-CM | POA: Diagnosis not present

## 2020-09-24 DIAGNOSIS — M9901 Segmental and somatic dysfunction of cervical region: Secondary | ICD-10-CM | POA: Diagnosis not present

## 2020-09-27 DIAGNOSIS — F4310 Post-traumatic stress disorder, unspecified: Secondary | ICD-10-CM | POA: Diagnosis not present

## 2020-10-03 DIAGNOSIS — M9902 Segmental and somatic dysfunction of thoracic region: Secondary | ICD-10-CM | POA: Diagnosis not present

## 2020-10-03 DIAGNOSIS — M9901 Segmental and somatic dysfunction of cervical region: Secondary | ICD-10-CM | POA: Diagnosis not present

## 2020-10-03 DIAGNOSIS — M9903 Segmental and somatic dysfunction of lumbar region: Secondary | ICD-10-CM | POA: Diagnosis not present

## 2020-10-04 DIAGNOSIS — F4310 Post-traumatic stress disorder, unspecified: Secondary | ICD-10-CM | POA: Diagnosis not present

## 2020-10-17 ENCOUNTER — Encounter: Payer: Self-pay | Admitting: Dermatology

## 2020-10-17 ENCOUNTER — Other Ambulatory Visit: Payer: Self-pay

## 2020-10-17 ENCOUNTER — Ambulatory Visit: Payer: Medicare PPO | Admitting: Dermatology

## 2020-10-17 DIAGNOSIS — D485 Neoplasm of uncertain behavior of skin: Secondary | ICD-10-CM

## 2020-10-17 DIAGNOSIS — M9902 Segmental and somatic dysfunction of thoracic region: Secondary | ICD-10-CM | POA: Diagnosis not present

## 2020-10-17 DIAGNOSIS — M9903 Segmental and somatic dysfunction of lumbar region: Secondary | ICD-10-CM | POA: Diagnosis not present

## 2020-10-17 DIAGNOSIS — D044 Carcinoma in situ of skin of scalp and neck: Secondary | ICD-10-CM

## 2020-10-17 DIAGNOSIS — M9901 Segmental and somatic dysfunction of cervical region: Secondary | ICD-10-CM | POA: Diagnosis not present

## 2020-10-17 NOTE — Patient Instructions (Signed)

## 2020-10-22 ENCOUNTER — Telehealth: Payer: Self-pay

## 2020-10-22 DIAGNOSIS — F4310 Post-traumatic stress disorder, unspecified: Secondary | ICD-10-CM | POA: Diagnosis not present

## 2020-10-22 NOTE — Telephone Encounter (Signed)
Phone call to patient with his pathology results. Voicemail left for patient to give the office a call back.  ?

## 2020-10-22 NOTE — Telephone Encounter (Signed)
-----   Message from [name], MD sent at 10/19/2020  4:43 PM EDT ----- Schedule surgery with Dr. Darene Lamer

## 2020-10-23 DIAGNOSIS — I8311 Varicose veins of right lower extremity with inflammation: Secondary | ICD-10-CM | POA: Diagnosis not present

## 2020-10-23 DIAGNOSIS — I824Z2 Acute embolism and thrombosis of unspecified deep veins of left distal lower extremity: Secondary | ICD-10-CM | POA: Diagnosis not present

## 2020-10-23 DIAGNOSIS — I8312 Varicose veins of left lower extremity with inflammation: Secondary | ICD-10-CM | POA: Diagnosis not present

## 2020-10-24 NOTE — Telephone Encounter (Signed)
-----   Message from [name], MD sent at 10/19/2020  4:43 PM EDT ----- Schedule surgery with Dr. Darene Lamer

## 2020-10-24 NOTE — Telephone Encounter (Signed)
Made next available surgery with Dr Denna Haggard

## 2020-10-30 DIAGNOSIS — F4310 Post-traumatic stress disorder, unspecified: Secondary | ICD-10-CM | POA: Diagnosis not present

## 2020-10-31 ENCOUNTER — Telehealth: Payer: Self-pay | Admitting: Dermatology

## 2020-10-31 ENCOUNTER — Encounter: Payer: Self-pay | Admitting: Dermatology

## 2020-10-31 NOTE — Progress Notes (Signed)
   Follow-Up Visit   Subjective  Tanner [name] is a 75 y.o. male who presents for the following: Skin Problem (New spots on scalp).  3 new crust on scalp Location:  Duration:  Quality:  Associated Signs/Symptoms: Modifying Factors:  Severity:  Timing: Context:   Objective  Well appearing patient in no apparent distress; mood and affect are within normal limits. Objective  Mid Occipital Scalp: 1.2 cm crust with focal erosion       Objective  Left Occipital Scalp: 1.3 cm crust       Objective  Mid Frontal Scalp: 1.1 cm crust         A focused examination was performed including Head and neck.. Relevant physical exam findings are noted in the Assessment and Plan.   Assessment & Plan    Neoplasm of uncertain behavior of skin (3) Mid Occipital Scalp  Skin / nail biopsy Type of biopsy: tangential   Informed consent: discussed and consent obtained   Timeout: patient name, date of birth, surgical site, and procedure verified   Procedure prep:  Patient was prepped and draped in usual sterile fashion (Non sterile) Prep type:  Chlorhexidine Anesthesia: the lesion was anesthetized in a standard fashion   Anesthetic:  1% lidocaine w/ epinephrine 1-100,000 local infiltration Instrument used: flexible razor blade   Outcome: patient tolerated procedure well   Post-procedure details: wound care instructions given    Specimen 1 - Surgical pathology Differential Diagnosis: bcc vs scc  Check Margins: No  Left Occipital Scalp  Skin / nail biopsy Type of biopsy: tangential   Informed consent: discussed and consent obtained   Timeout: patient name, date of birth, surgical site, and procedure verified   Procedure prep:  Patient was prepped and draped in usual sterile fashion (Non sterile) Prep type:  Chlorhexidine Anesthesia: the lesion was anesthetized in a standard fashion   Anesthetic:  1% lidocaine w/ epinephrine 1-100,000 local  infiltration Instrument used: flexible razor blade   Outcome: patient tolerated procedure well   Post-procedure details: wound care instructions given    Specimen 2 - Surgical pathology Differential Diagnosis: bcc vs scc  Check Margins: No  Mid Frontal Scalp  Skin / nail biopsy Type of biopsy: tangential   Informed consent: discussed and consent obtained   Timeout: patient name, date of birth, surgical site, and procedure verified   Procedure prep:  Patient was prepped and draped in usual sterile fashion (Non sterile) Prep type:  Chlorhexidine Anesthesia: the lesion was anesthetized in a standard fashion   Anesthetic:  1% lidocaine w/ epinephrine 1-100,000 local infiltration Instrument used: flexible razor blade   Outcome: patient tolerated procedure well   Post-procedure details: wound care instructions given    Specimen 3 - Surgical pathology Differential Diagnosis: bcc vs scc  Check Margins: No      I, [name], MD, have reviewed all documentation for this visit.  The documentation on 10/31/20 for the exam, diagnosis, procedures, and orders are all accurate and complete.

## 2020-10-31 NOTE — Telephone Encounter (Signed)
Path to patient surgery July

## 2020-10-31 NOTE — Telephone Encounter (Signed)
He got a message telling him to call back. He thinks it was about biopsy results.

## 2020-11-02 DIAGNOSIS — M25561 Pain in right knee: Secondary | ICD-10-CM | POA: Diagnosis not present

## 2020-11-02 DIAGNOSIS — M25562 Pain in left knee: Secondary | ICD-10-CM | POA: Diagnosis not present

## 2020-11-06 DIAGNOSIS — F4310 Post-traumatic stress disorder, unspecified: Secondary | ICD-10-CM | POA: Diagnosis not present

## 2020-11-15 DIAGNOSIS — M25561 Pain in right knee: Secondary | ICD-10-CM | POA: Diagnosis not present

## 2020-11-15 DIAGNOSIS — F4310 Post-traumatic stress disorder, unspecified: Secondary | ICD-10-CM | POA: Diagnosis not present

## 2020-11-15 DIAGNOSIS — M25562 Pain in left knee: Secondary | ICD-10-CM | POA: Diagnosis not present

## 2020-11-22 DIAGNOSIS — F4310 Post-traumatic stress disorder, unspecified: Secondary | ICD-10-CM | POA: Diagnosis not present

## 2020-11-30 DIAGNOSIS — M25561 Pain in right knee: Secondary | ICD-10-CM | POA: Diagnosis not present

## 2020-12-04 DIAGNOSIS — F4310 Post-traumatic stress disorder, unspecified: Secondary | ICD-10-CM | POA: Diagnosis not present

## 2020-12-05 DIAGNOSIS — L718 Other rosacea: Secondary | ICD-10-CM | POA: Diagnosis not present

## 2020-12-05 DIAGNOSIS — L719 Rosacea, unspecified: Secondary | ICD-10-CM | POA: Diagnosis not present

## 2020-12-05 DIAGNOSIS — L918 Other hypertrophic disorders of the skin: Secondary | ICD-10-CM | POA: Diagnosis not present

## 2020-12-05 DIAGNOSIS — L739 Follicular disorder, unspecified: Secondary | ICD-10-CM | POA: Diagnosis not present

## 2020-12-05 DIAGNOSIS — L708 Other acne: Secondary | ICD-10-CM | POA: Diagnosis not present

## 2020-12-12 DIAGNOSIS — F4310 Post-traumatic stress disorder, unspecified: Secondary | ICD-10-CM | POA: Diagnosis not present

## 2020-12-20 ENCOUNTER — Ambulatory Visit (INDEPENDENT_AMBULATORY_CARE_PROVIDER_SITE_OTHER): Payer: Medicare PPO | Admitting: Dermatology

## 2020-12-20 ENCOUNTER — Other Ambulatory Visit: Payer: Self-pay

## 2020-12-20 DIAGNOSIS — D099 Carcinoma in situ, unspecified: Secondary | ICD-10-CM

## 2020-12-20 DIAGNOSIS — D044 Carcinoma in situ of skin of scalp and neck: Secondary | ICD-10-CM | POA: Diagnosis not present

## 2020-12-20 DIAGNOSIS — L988 Other specified disorders of the skin and subcutaneous tissue: Secondary | ICD-10-CM | POA: Diagnosis not present

## 2020-12-20 NOTE — Patient Instructions (Signed)

## 2020-12-26 DIAGNOSIS — F4310 Post-traumatic stress disorder, unspecified: Secondary | ICD-10-CM | POA: Diagnosis not present

## 2020-12-27 ENCOUNTER — Ambulatory Visit (INDEPENDENT_AMBULATORY_CARE_PROVIDER_SITE_OTHER): Payer: Medicare PPO

## 2020-12-27 ENCOUNTER — Other Ambulatory Visit: Payer: Self-pay

## 2020-12-27 DIAGNOSIS — Z4802 Encounter for removal of sutures: Secondary | ICD-10-CM

## 2020-12-27 NOTE — Progress Notes (Signed)
NTS Suture removal, No s/s of infection.

## 2020-12-29 ENCOUNTER — Encounter: Payer: Self-pay | Admitting: Dermatology

## 2020-12-31 DIAGNOSIS — N521 Erectile dysfunction due to diseases classified elsewhere: Secondary | ICD-10-CM | POA: Diagnosis not present

## 2020-12-31 DIAGNOSIS — N312 Flaccid neuropathic bladder, not elsewhere classified: Secondary | ICD-10-CM | POA: Diagnosis not present

## 2020-12-31 DIAGNOSIS — N4340 Spermatocele of epididymis, unspecified: Secondary | ICD-10-CM | POA: Diagnosis not present

## 2021-01-03 NOTE — Progress Notes (Signed)
   Follow-Up Visit   Subjective  Tanner [name] is a 75 y.o. male who presents for the following: Procedure (Here to treat Mid occipital scalp, left occipital scalp and mid frontal scalp. All [facility]. ).  Cancer scalp Location:  Duration:  Quality:  Associated Signs/Symptoms: Modifying Factors:  Severity:  Timing: Context:   Objective  Well appearing patient in no apparent distress; mood and affect are within normal limits. left Occipital Scalp Lesion identified by Dr.Tammye Kahler and nurse in room.  3-0 vicryl x 3 mattress in the center    A focused examination was performed including head and neck. Relevant physical exam findings are noted in the Assessment and Plan.   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  Destruction of lesion Complexity: simple   Destruction method: electrodesiccation and curettage   Informed consent: discussed and consent obtained   Timeout:  patient name, date of birth, surgical site, and procedure verified Anesthesia: the lesion was anesthetized in a standard fashion   Anesthetic:  1% lidocaine w/ epinephrine 1-100,000 local infiltration Curettage performed in three different directions: Yes   Electrodesiccation performed over the curetted area: Yes   Curettage cycles:  3 Final wound size (cm):  1.5 Hemostasis achieved with:  ferric subsulfate Outcome: patient tolerated procedure well with no complications   Additional details:  Wound innoculated with 5 fluorouracil solution.  Skin excision  Lesion length (cm):  1.5 Lesion width (cm):  1.5 Margin per side (cm):  0.1 Total excision diameter (cm):  1.7 Informed consent: discussed and consent obtained   Timeout: patient name, date of birth, surgical site, and procedure verified   Anesthesia: the lesion was anesthetized in a standard fashion   Anesthetic:  1% lidocaine w/ epinephrine 1-100,000 local infiltration Instrument used: #15 blade   Hemostasis achieved  with: pressure and electrodesiccation   Outcome: patient tolerated procedure well with no complications   Post-procedure details: sterile dressing applied and wound care instructions given   Dressing type: bandage, petrolatum and pressure dressing    Skin repair Complexity:  Intermediate Final length (cm):  1.5 Informed consent: discussed and consent obtained   Timeout: patient name, date of birth, surgical site, and procedure verified   Procedure prep:  Patient was prepped and draped in usual sterile fashion Prep type:  Chlorhexidine Reason for type of repair: reduce tension to allow closure and reduce the risk of dehiscence, infection, and necrosis   Undermining: edges could be approximated without difficulty   Subcutaneous layers (deep stitches):  Suture size:  3-0 Suture type: Vicryl (polyglactin 910)   Fine/surface layer approximation (top stitches):  Suture size:  3-0 Suture type: Vicryl (polyglactin 910)    Specimen 1 - Surgical pathology Differential Diagnosis: scc right margin stained   Check Margins: No  Defer until next visit due to excision today Mid occipital scalp scc Mid frontal scalp scc  Posterior scalp lesion initially curetted showing that there was definitely deep keratinous extension making necessary squamous cell carcinoma rather than carcinoma in situ.  Base and edges cauterized, re-curetted followed by narrow margin excision and layered closure.      I, Tanner Monarch, MD, have reviewed all documentation for this visit.  The documentation on 01/03/21 for the exam, diagnosis, procedures, and orders are all accurate and complete.

## 2021-01-09 DIAGNOSIS — F4310 Post-traumatic stress disorder, unspecified: Secondary | ICD-10-CM | POA: Diagnosis not present

## 2021-01-14 DIAGNOSIS — F4310 Post-traumatic stress disorder, unspecified: Secondary | ICD-10-CM | POA: Diagnosis not present

## 2021-01-17 DIAGNOSIS — M25561 Pain in right knee: Secondary | ICD-10-CM | POA: Diagnosis not present

## 2021-01-21 DIAGNOSIS — Z03818 Encounter for observation for suspected exposure to other biological agents ruled out: Secondary | ICD-10-CM | POA: Diagnosis not present

## 2021-01-22 DIAGNOSIS — F4310 Post-traumatic stress disorder, unspecified: Secondary | ICD-10-CM | POA: Diagnosis not present

## 2021-01-30 DIAGNOSIS — F4310 Post-traumatic stress disorder, unspecified: Secondary | ICD-10-CM | POA: Diagnosis not present

## 2021-01-31 ENCOUNTER — Other Ambulatory Visit: Payer: Self-pay

## 2021-01-31 ENCOUNTER — Encounter: Payer: Self-pay | Admitting: Dermatology

## 2021-01-31 ENCOUNTER — Ambulatory Visit (INDEPENDENT_AMBULATORY_CARE_PROVIDER_SITE_OTHER): Payer: Medicare PPO | Admitting: Dermatology

## 2021-01-31 DIAGNOSIS — L820 Inflamed seborrheic keratosis: Secondary | ICD-10-CM

## 2021-01-31 DIAGNOSIS — L905 Scar conditions and fibrosis of skin: Secondary | ICD-10-CM

## 2021-01-31 DIAGNOSIS — D044 Carcinoma in situ of skin of scalp and neck: Secondary | ICD-10-CM

## 2021-01-31 DIAGNOSIS — L910 Hypertrophic scar: Secondary | ICD-10-CM | POA: Diagnosis not present

## 2021-01-31 DIAGNOSIS — D049 Carcinoma in situ of skin, unspecified: Secondary | ICD-10-CM

## 2021-01-31 DIAGNOSIS — D485 Neoplasm of uncertain behavior of skin: Secondary | ICD-10-CM

## 2021-01-31 MED ORDER — TRIAMCINOLONE ACETONIDE 40 MG/ML IJ SUSP
40.0000 mg | Freq: Once | INTRAMUSCULAR | Status: AC
  Administered 2021-01-31: 40 mg

## 2021-01-31 NOTE — Patient Instructions (Signed)

## 2021-02-01 DIAGNOSIS — M25561 Pain in right knee: Secondary | ICD-10-CM | POA: Diagnosis not present

## 2021-02-08 DIAGNOSIS — M25561 Pain in right knee: Secondary | ICD-10-CM | POA: Diagnosis not present

## 2021-02-09 NOTE — Progress Notes (Signed)
Follow-Up Visit   Subjective  Tanner [name] is a 75 y.o. male who presents for the following: Procedure (Here for treatment- Mid Occipital Scalp & Mid frontal Scalp- scc x 2).  Biopsy-proven carcinoma in situ to treat on scalp plus bump at the previously treated scalp cancer plus new crust on the right temple. Location:  Duration:  Quality:  Associated Signs/Symptoms: Modifying Factors:  Severity:  Timing: Context:   Objective  Well appearing patient in no apparent distress; mood and affect are within normal limits. Mid Frontal Scalp [phone] Lesion identified by Dr.Caralee Morea and nurse in room.   Right Temple Hemorrhagic 6 mm waxy papule, probable carcinoma     Mid Parietal Scalp Thick scar from Previous CIS/SCCA that was excised will inject with a little TAC.  Hopefully this will flatten the scar and make it easier to be certain that there is no skin cancer recurrence.    A focused examination was performed including the neck. Relevant physical exam findings are noted in the Assessment and Plan.   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  Destruction of lesion Complexity: simple   Destruction method: electrodesiccation and curettage   Informed consent: discussed and consent obtained   Timeout:  patient name, date of birth, surgical site, and procedure verified Anesthesia: the lesion was anesthetized in a standard fashion   Anesthetic:  1% lidocaine w/ epinephrine 1-100,000 local infiltration Curettage performed in three different directions: Yes   Curettage cycles:  3 Lesion length (cm):  2 Lesion width (cm):  2 Margin per side (cm):  0 Final wound size (cm):  2 Hemostasis achieved with:  ferric subsulfate Outcome: patient tolerated procedure well with no complications   Post-procedure details: wound care instructions given   Additional details:  Wound innoculated with 5 fluorouracil solution.  Neoplasm  of uncertain behavior of skin Right Temple  Skin / nail biopsy Type of biopsy: tangential   Informed consent: discussed and consent obtained   Timeout: patient name, date of birth, surgical site, and procedure verified   Anesthesia: the lesion was anesthetized in a standard fashion   Anesthetic:  1% lidocaine w/ epinephrine 1-100,000 local infiltration Instrument used: flexible razor blade   Hemostasis achieved with: ferric subsulfate   Outcome: patient tolerated procedure well   Post-procedure details: wound care instructions given    Destruction of lesion Complexity: simple   Destruction method: electrodesiccation and curettage   Destruction method comment:  Scissors were used to snip tag at the base Informed consent: discussed and consent obtained   Timeout:  patient name, date of birth, surgical site, and procedure verified Anesthesia: the lesion was anesthetized in a standard fashion   Anesthetic:  1% lidocaine w/ epinephrine 1-100,000 local infiltration Curettage performed in three different directions: Yes   Curettage cycles:  3 Lesion length (cm):  1.4 Lesion width (cm):  1.4 Margin per side (cm):  0 Final wound size (cm):  1.4 Hemostasis achieved with:  pressure and aluminum chloride Outcome: patient tolerated procedure well with no complications   Post-procedure details: wound care instructions given    Specimen 1 - Surgical pathology Differential Diagnosis: scc vs bcc txpbx Check Margins: No  After shave biopsy the base of the lesion was treated with curettage plus cautery  Keloid Mid Parietal Scalp  Intralesional injection - Mid Parietal Scalp Location: scalp  Informed Consent: Discussed risks (infection, pain, bleeding, bruising, thinning of the skin, loss of skin  pigment,  Indentation, lack of resolution, and recurrence of lesion) and benefits of the procedure, as well as the alternatives. Informed consent was obtained. Preparation: The area was prepared in  a standard fashion.   Procedure Details: An intralesional injection was performed with Kenalog 40 mg/cc. 0.1 cc in total were injected.  Total number of injections: 1  Plan: The patient was instructed on post-op care. Recommend OTC analgesia as needed for pain.   triamcinolone acetonide (KENALOG-40) injection 40 mg - Mid Parietal Scalp       I, [name], MD, have reviewed all documentation for this visit.  The documentation on 02/09/21 for the exam, diagnosis, procedures, and orders are all accurate and complete.

## 2021-02-12 DIAGNOSIS — F4310 Post-traumatic stress disorder, unspecified: Secondary | ICD-10-CM | POA: Diagnosis not present

## 2021-02-14 DIAGNOSIS — M79605 Pain in left leg: Secondary | ICD-10-CM | POA: Diagnosis not present

## 2021-02-14 DIAGNOSIS — I87022 Postthrombotic syndrome with inflammation of left lower extremity: Secondary | ICD-10-CM | POA: Diagnosis not present

## 2021-02-18 ENCOUNTER — Ambulatory Visit: Payer: Medicare PPO | Admitting: Dermatology

## 2021-02-19 DIAGNOSIS — F4310 Post-traumatic stress disorder, unspecified: Secondary | ICD-10-CM | POA: Diagnosis not present

## 2021-02-20 DIAGNOSIS — Z0001 Encounter for general adult medical examination with abnormal findings: Secondary | ICD-10-CM | POA: Diagnosis not present

## 2021-02-20 DIAGNOSIS — E7800 Pure hypercholesterolemia, unspecified: Secondary | ICD-10-CM | POA: Diagnosis not present

## 2021-02-20 DIAGNOSIS — K219 Gastro-esophageal reflux disease without esophagitis: Secondary | ICD-10-CM | POA: Diagnosis not present

## 2021-02-20 DIAGNOSIS — E559 Vitamin D deficiency, unspecified: Secondary | ICD-10-CM | POA: Diagnosis not present

## 2021-02-20 DIAGNOSIS — Z79899 Other long term (current) drug therapy: Secondary | ICD-10-CM | POA: Diagnosis not present

## 2021-02-21 ENCOUNTER — Encounter: Payer: Medicare PPO | Admitting: Dermatology

## 2021-02-28 ENCOUNTER — Encounter: Payer: Self-pay | Admitting: Dermatology

## 2021-03-05 DIAGNOSIS — F4310 Post-traumatic stress disorder, unspecified: Secondary | ICD-10-CM | POA: Diagnosis not present

## 2021-03-18 DIAGNOSIS — F4310 Post-traumatic stress disorder, unspecified: Secondary | ICD-10-CM | POA: Diagnosis not present

## 2021-03-19 ENCOUNTER — Other Ambulatory Visit: Payer: Self-pay

## 2021-03-19 ENCOUNTER — Ambulatory Visit: Payer: Medicare PPO | Admitting: Dermatology

## 2021-03-19 ENCOUNTER — Encounter: Payer: Self-pay | Admitting: Dermatology

## 2021-03-19 DIAGNOSIS — C44519 Basal cell carcinoma of skin of other part of trunk: Secondary | ICD-10-CM | POA: Diagnosis not present

## 2021-03-19 DIAGNOSIS — Z1283 Encounter for screening for malignant neoplasm of skin: Secondary | ICD-10-CM

## 2021-03-19 DIAGNOSIS — D0439 Carcinoma in situ of skin of other parts of face: Secondary | ICD-10-CM

## 2021-03-19 DIAGNOSIS — D485 Neoplasm of uncertain behavior of skin: Secondary | ICD-10-CM

## 2021-03-19 NOTE — Patient Instructions (Signed)

## 2021-03-25 ENCOUNTER — Telehealth: Payer: Self-pay | Admitting: *Deleted

## 2021-03-25 NOTE — Telephone Encounter (Signed)
-----   Message from [name], MD sent at 03/21/2021  6:32 AM EDT ----- Schedule surgery with Dr. Darene Lamer

## 2021-03-25 NOTE — Telephone Encounter (Signed)
Pathology to patient-surgery appointment scheduled.  

## 2021-03-26 DIAGNOSIS — F4310 Post-traumatic stress disorder, unspecified: Secondary | ICD-10-CM | POA: Diagnosis not present

## 2021-04-02 ENCOUNTER — Encounter: Payer: Self-pay | Admitting: Dermatology

## 2021-04-02 DIAGNOSIS — F4310 Post-traumatic stress disorder, unspecified: Secondary | ICD-10-CM | POA: Diagnosis not present

## 2021-04-02 NOTE — Progress Notes (Signed)
   Follow-Up Visit   Subjective  Tanner [name] is a 75 y.o. male who presents for the following: Annual Exam (Left cheek x 1 year- wont go away).  General skin check plus several spots which have changed Location:  Duration:  Quality:  Associated Signs/Symptoms: Modifying Factors:  Severity:  Timing: Context:   Objective  Well appearing patient in no apparent distress; mood and affect are within normal limits. Torso - Posterior (Back) Waist up skin examination: No atypical pigmented lesions.  2 possible nonmelanoma skin cancers left cheek and back will be biopsied today.  Mid Back Waxy 8mm pink crust compatible with superficial BCC       Left Zygomatic Area Inflamed focally eroded 1 cm crust (probable freeze failure): Rule out carcinoma in situ         All skin waist up examined.   Assessment & Plan    Encounter for screening for malignant neoplasm of skin Torso - Posterior (Back)  Annual skin examination.  Neoplasm of uncertain behavior of skin (2) Mid Back  Skin / nail biopsy Type of biopsy: tangential   Informed consent: discussed and consent obtained   Timeout: patient name, date of birth, surgical site, and procedure verified   Procedure prep:  Patient was prepped and draped in usual sterile fashion (Non sterile) Prep type:  Chlorhexidine Anesthesia: the lesion was anesthetized in a standard fashion   Anesthetic:  1% lidocaine w/ epinephrine 1-100,000 local infiltration Instrument used: flexible razor blade   Outcome: patient tolerated procedure well   Post-procedure details: wound care instructions given    Specimen 1 - Surgical pathology Differential Diagnosis: bcc vs scc  Check Margins: No  Left Zygomatic Area  Skin / nail biopsy Type of biopsy: tangential   Informed consent: discussed and consent obtained   Timeout: patient name, date of birth, surgical site, and procedure verified   Procedure prep:  Patient was prepped and  draped in usual sterile fashion (Non sterile) Prep type:  Chlorhexidine Anesthesia: the lesion was anesthetized in a standard fashion   Anesthetic:  1% lidocaine w/ epinephrine 1-100,000 local infiltration Instrument used: flexible razor blade   Outcome: patient tolerated procedure well   Post-procedure details: wound care instructions given    Specimen 2 - Surgical pathology Differential Diagnosis: bcc vs scc  Check Margins: No      I, [name], MD, have reviewed all documentation for this visit.  The documentation on 04/02/21 for the exam, diagnosis, procedures, and orders are all accurate and complete.

## 2021-04-03 DIAGNOSIS — Z9089 Acquired absence of other organs: Secondary | ICD-10-CM | POA: Diagnosis not present

## 2021-04-03 DIAGNOSIS — H903 Sensorineural hearing loss, bilateral: Secondary | ICD-10-CM | POA: Diagnosis not present

## 2021-04-03 DIAGNOSIS — H6123 Impacted cerumen, bilateral: Secondary | ICD-10-CM | POA: Diagnosis not present

## 2021-04-03 DIAGNOSIS — R053 Chronic cough: Secondary | ICD-10-CM | POA: Diagnosis not present

## 2021-04-04 DIAGNOSIS — H903 Sensorineural hearing loss, bilateral: Secondary | ICD-10-CM | POA: Diagnosis not present

## 2021-04-11 DIAGNOSIS — F4310 Post-traumatic stress disorder, unspecified: Secondary | ICD-10-CM | POA: Diagnosis not present

## 2021-04-15 ENCOUNTER — Other Ambulatory Visit: Payer: Self-pay | Admitting: Physician Assistant

## 2021-04-15 DIAGNOSIS — H9120 Sudden idiopathic hearing loss, unspecified ear: Secondary | ICD-10-CM

## 2021-04-16 DIAGNOSIS — F4310 Post-traumatic stress disorder, unspecified: Secondary | ICD-10-CM | POA: Diagnosis not present

## 2021-04-30 DIAGNOSIS — F4310 Post-traumatic stress disorder, unspecified: Secondary | ICD-10-CM | POA: Diagnosis not present

## 2021-05-05 ENCOUNTER — Other Ambulatory Visit: Payer: Self-pay

## 2021-05-05 ENCOUNTER — Ambulatory Visit
Admission: RE | Admit: 2021-05-05 | Discharge: 2021-05-05 | Disposition: A | Discharge status: Home / Self Care | Payer: Medicare PPO | Source: Ambulatory Visit | Attending: Physician Assistant | Admitting: Physician Assistant

## 2021-05-05 DIAGNOSIS — H9120 Sudden idiopathic hearing loss, unspecified ear: Secondary | ICD-10-CM

## 2021-05-05 DIAGNOSIS — H9192 Unspecified hearing loss, left ear: Secondary | ICD-10-CM | POA: Diagnosis not present

## 2021-05-05 MED ORDER — GADOBENATE DIMEGLUMINE 529 MG/ML IV SOLN
16.0000 mL | Freq: Once | INTRAVENOUS | Status: AC | PRN
  Administered 2021-05-05: 16 mL via INTRAVENOUS

## 2021-05-14 DIAGNOSIS — F4310 Post-traumatic stress disorder, unspecified: Secondary | ICD-10-CM | POA: Diagnosis not present

## 2021-05-20 DIAGNOSIS — H903 Sensorineural hearing loss, bilateral: Secondary | ICD-10-CM | POA: Diagnosis not present

## 2021-05-21 DIAGNOSIS — F4310 Post-traumatic stress disorder, unspecified: Secondary | ICD-10-CM | POA: Diagnosis not present

## 2021-05-23 ENCOUNTER — Encounter: Payer: Medicare PPO | Admitting: Dermatology

## 2021-05-28 DIAGNOSIS — F4310 Post-traumatic stress disorder, unspecified: Secondary | ICD-10-CM | POA: Diagnosis not present

## 2021-06-04 DIAGNOSIS — F4310 Post-traumatic stress disorder, unspecified: Secondary | ICD-10-CM | POA: Diagnosis not present

## 2021-06-13 DIAGNOSIS — H9113 Presbycusis, bilateral: Secondary | ICD-10-CM | POA: Diagnosis not present

## 2021-06-13 DIAGNOSIS — H9122 Sudden idiopathic hearing loss, left ear: Secondary | ICD-10-CM | POA: Diagnosis not present

## 2021-06-18 DIAGNOSIS — F4310 Post-traumatic stress disorder, unspecified: Secondary | ICD-10-CM | POA: Diagnosis not present

## 2021-06-25 DIAGNOSIS — H26491 Other secondary cataract, right eye: Secondary | ICD-10-CM | POA: Diagnosis not present

## 2021-06-25 DIAGNOSIS — H524 Presbyopia: Secondary | ICD-10-CM | POA: Diagnosis not present

## 2021-06-25 DIAGNOSIS — F4310 Post-traumatic stress disorder, unspecified: Secondary | ICD-10-CM | POA: Diagnosis not present

## 2021-06-25 DIAGNOSIS — H5213 Myopia, bilateral: Secondary | ICD-10-CM | POA: Diagnosis not present

## 2021-07-02 DIAGNOSIS — F4310 Post-traumatic stress disorder, unspecified: Secondary | ICD-10-CM | POA: Diagnosis not present

## 2021-07-09 DIAGNOSIS — F4310 Post-traumatic stress disorder, unspecified: Secondary | ICD-10-CM | POA: Diagnosis not present

## 2021-07-10 DIAGNOSIS — R61 Generalized hyperhidrosis: Secondary | ICD-10-CM | POA: Diagnosis not present

## 2021-07-10 DIAGNOSIS — J019 Acute sinusitis, unspecified: Secondary | ICD-10-CM | POA: Diagnosis not present

## 2021-07-10 DIAGNOSIS — Z125 Encounter for screening for malignant neoplasm of prostate: Secondary | ICD-10-CM | POA: Diagnosis not present

## 2021-07-10 DIAGNOSIS — R251 Tremor, unspecified: Secondary | ICD-10-CM | POA: Diagnosis not present

## 2021-07-10 DIAGNOSIS — R059 Cough, unspecified: Secondary | ICD-10-CM | POA: Diagnosis not present

## 2021-07-18 ENCOUNTER — Ambulatory Visit (INDEPENDENT_AMBULATORY_CARE_PROVIDER_SITE_OTHER): Payer: Medicare PPO | Admitting: Dermatology

## 2021-07-18 ENCOUNTER — Other Ambulatory Visit: Payer: Self-pay

## 2021-07-18 ENCOUNTER — Encounter: Payer: Self-pay | Admitting: Dermatology

## 2021-07-18 DIAGNOSIS — D0439 Carcinoma in situ of skin of other parts of face: Secondary | ICD-10-CM

## 2021-07-18 DIAGNOSIS — C44519 Basal cell carcinoma of skin of other part of trunk: Secondary | ICD-10-CM | POA: Diagnosis not present

## 2021-07-18 DIAGNOSIS — L570 Actinic keratosis: Secondary | ICD-10-CM

## 2021-07-18 DIAGNOSIS — D0430 Carcinoma in situ of skin of unspecified part of face: Secondary | ICD-10-CM

## 2021-07-18 NOTE — Patient Instructions (Signed)

## 2021-08-03 ENCOUNTER — Encounter: Payer: Self-pay | Admitting: Dermatology

## 2021-08-03 NOTE — Progress Notes (Signed)
° °  Follow-Up Visit   Subjective  Tanner [name] is a 76 y.o. male who presents for the following: Procedure (Patient here today for treatment of BCC x 1 mid back and CIS x 1 right zygomatic area. ).  Biopsy proven nonmelanoma skin cancers plus check new crust on leg Location:  Duration:  Quality:  Associated Signs/Symptoms: Modifying Factors:  Severity:  Timing: Context:   Objective  Well appearing patient in no apparent distress; mood and affect are within normal limits. Mid Back Lesion identified by Dr.Chayim Bialas and nurse in room.    Left Zygomatic Area Lesion identified by Dr.Avelyn Touch and nurse in room.    Left Distal Medial Shin 4 mm pink crust    All skin waist up examined.  Plus leg.   Assessment & Plan    Basal cell carcinoma (BCC) of back Mid Back  Destruction of lesion Complexity: simple   Destruction method: electrodesiccation and curettage   Informed consent: discussed and consent obtained   Timeout:  patient name, date of birth, surgical site, and procedure verified Anesthesia: the lesion was anesthetized in a standard fashion   Anesthetic:  1% lidocaine w/ epinephrine 1-100,000 local infiltration Curettage cycles:  3 Lesion length (cm):  1.7 Lesion width (cm):  1.7 Margin per side (cm):  0 Final wound size (cm):  1.7 Hemostasis achieved with:  ferric subsulfate Outcome: patient tolerated procedure well with no complications   Post-procedure details: sterile dressing applied and wound care instructions given   Dressing type: bandage and petrolatum   Additional details:  Wound inoculated with 5% Fluorouracil.    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 Left Zygomatic Area  Destruction of lesion Complexity: simple   Destruction method: electrodesiccation and curettage   Informed consent: discussed and consent obtained   Timeout:  patient name, date of birth, surgical site, and procedure verified Anesthesia: the lesion was anesthetized in a  standard fashion   Anesthetic:  1% lidocaine w/ epinephrine 1-100,000 local infiltration Curettage performed in three different directions: Yes   Electrodesiccation performed over the curetted area: Yes   Curettage cycles:  3 Lesion length (cm):  2 Lesion width (cm):  2 Margin per side (cm):  0 Final wound size (cm):  2 Hemostasis achieved with:  ferric subsulfate Outcome: patient tolerated procedure well with no complications   Post-procedure details: sterile dressing applied and wound care instructions given   Dressing type: bandage and petrolatum   Additional details:  Wound inoculated with 5% Fluorouracil.    Actinic keratosis Left Distal Medial Shin  Biopsy if grows or bleeds.      I, [name], MD, have reviewed all documentation for this visit.  The documentation on 08/03/21 for the exam, diagnosis, procedures, and orders are all accurate and complete.

## 2021-08-06 DIAGNOSIS — F4310 Post-traumatic stress disorder, unspecified: Secondary | ICD-10-CM | POA: Diagnosis not present

## 2021-08-08 DIAGNOSIS — H903 Sensorineural hearing loss, bilateral: Secondary | ICD-10-CM | POA: Diagnosis not present

## 2021-08-13 DIAGNOSIS — F4310 Post-traumatic stress disorder, unspecified: Secondary | ICD-10-CM | POA: Diagnosis not present

## 2021-08-26 DIAGNOSIS — H6122 Impacted cerumen, left ear: Secondary | ICD-10-CM | POA: Diagnosis not present

## 2021-08-27 DIAGNOSIS — F4310 Post-traumatic stress disorder, unspecified: Secondary | ICD-10-CM | POA: Diagnosis not present

## 2021-09-10 DIAGNOSIS — F4310 Post-traumatic stress disorder, unspecified: Secondary | ICD-10-CM | POA: Diagnosis not present

## 2021-09-17 DIAGNOSIS — F4310 Post-traumatic stress disorder, unspecified: Secondary | ICD-10-CM | POA: Diagnosis not present

## 2021-09-24 DIAGNOSIS — K429 Umbilical hernia without obstruction or gangrene: Secondary | ICD-10-CM | POA: Diagnosis not present

## 2021-09-24 DIAGNOSIS — F4310 Post-traumatic stress disorder, unspecified: Secondary | ICD-10-CM | POA: Diagnosis not present

## 2021-09-24 DIAGNOSIS — R109 Unspecified abdominal pain: Secondary | ICD-10-CM | POA: Diagnosis not present

## 2021-09-26 ENCOUNTER — Other Ambulatory Visit: Payer: Self-pay | Admitting: Family Medicine

## 2021-09-26 DIAGNOSIS — R109 Unspecified abdominal pain: Secondary | ICD-10-CM

## 2021-09-26 DIAGNOSIS — K429 Umbilical hernia without obstruction or gangrene: Secondary | ICD-10-CM

## 2021-10-01 ENCOUNTER — Ambulatory Visit
Admission: RE | Admit: 2021-10-01 | Discharge: 2021-10-01 | Disposition: A | Discharge status: Home / Self Care | Payer: Medicare PPO | Source: Ambulatory Visit | Attending: Family Medicine | Admitting: Family Medicine

## 2021-10-01 DIAGNOSIS — K429 Umbilical hernia without obstruction or gangrene: Secondary | ICD-10-CM

## 2021-10-01 DIAGNOSIS — R1011 Right upper quadrant pain: Secondary | ICD-10-CM | POA: Diagnosis not present

## 2021-10-01 DIAGNOSIS — R109 Unspecified abdominal pain: Secondary | ICD-10-CM

## 2021-10-01 DIAGNOSIS — R110 Nausea: Secondary | ICD-10-CM | POA: Diagnosis not present

## 2021-10-01 DIAGNOSIS — F4310 Post-traumatic stress disorder, unspecified: Secondary | ICD-10-CM | POA: Diagnosis not present

## 2021-10-01 DIAGNOSIS — R10811 Right upper quadrant abdominal tenderness: Secondary | ICD-10-CM | POA: Diagnosis not present

## 2021-10-08 DIAGNOSIS — F4310 Post-traumatic stress disorder, unspecified: Secondary | ICD-10-CM | POA: Diagnosis not present

## 2021-10-09 ENCOUNTER — Other Ambulatory Visit: Payer: Self-pay | Admitting: Family Medicine

## 2021-10-09 DIAGNOSIS — R1033 Periumbilical pain: Secondary | ICD-10-CM

## 2021-10-09 DIAGNOSIS — R10815 Periumbilic abdominal tenderness: Secondary | ICD-10-CM

## 2021-10-15 DIAGNOSIS — M25561 Pain in right knee: Secondary | ICD-10-CM | POA: Diagnosis not present

## 2021-10-15 DIAGNOSIS — M7061 Trochanteric bursitis, right hip: Secondary | ICD-10-CM | POA: Diagnosis not present

## 2021-10-15 DIAGNOSIS — F4310 Post-traumatic stress disorder, unspecified: Secondary | ICD-10-CM | POA: Diagnosis not present

## 2021-10-21 DIAGNOSIS — M25551 Pain in right hip: Secondary | ICD-10-CM | POA: Diagnosis not present

## 2021-10-22 DIAGNOSIS — F4310 Post-traumatic stress disorder, unspecified: Secondary | ICD-10-CM | POA: Diagnosis not present

## 2021-10-24 ENCOUNTER — Ambulatory Visit
Admission: RE | Admit: 2021-10-24 | Discharge: 2021-10-24 | Disposition: A | Discharge status: Home / Self Care | Payer: Medicare PPO | Source: Ambulatory Visit | Attending: Family Medicine | Admitting: Family Medicine

## 2021-10-24 DIAGNOSIS — I700 Atherosclerosis of aorta: Secondary | ICD-10-CM | POA: Diagnosis not present

## 2021-10-24 DIAGNOSIS — R1033 Periumbilical pain: Secondary | ICD-10-CM | POA: Diagnosis not present

## 2021-10-24 DIAGNOSIS — R10815 Periumbilic abdominal tenderness: Secondary | ICD-10-CM

## 2021-10-24 MED ORDER — IOPAMIDOL (ISOVUE-300) INJECTION 61%
100.0000 mL | Freq: Once | INTRAVENOUS | Status: AC | PRN
  Administered 2021-10-24: 100 mL via INTRAVENOUS

## 2021-11-04 ENCOUNTER — Other Ambulatory Visit: Payer: Medicare PPO

## 2021-11-05 DIAGNOSIS — F4310 Post-traumatic stress disorder, unspecified: Secondary | ICD-10-CM | POA: Diagnosis not present

## 2021-11-06 DIAGNOSIS — M25551 Pain in right hip: Secondary | ICD-10-CM | POA: Diagnosis not present

## 2021-11-12 DIAGNOSIS — F4310 Post-traumatic stress disorder, unspecified: Secondary | ICD-10-CM | POA: Diagnosis not present

## 2021-11-19 DIAGNOSIS — F4310 Post-traumatic stress disorder, unspecified: Secondary | ICD-10-CM | POA: Diagnosis not present

## 2021-11-25 ENCOUNTER — Telehealth: Payer: Self-pay | Admitting: Internal Medicine

## 2021-11-25 DIAGNOSIS — L84 Corns and callosities: Secondary | ICD-10-CM | POA: Diagnosis not present

## 2021-11-25 DIAGNOSIS — Z9889 Other specified postprocedural states: Secondary | ICD-10-CM | POA: Diagnosis not present

## 2021-11-25 DIAGNOSIS — M2042 Other hammer toe(s) (acquired), left foot: Secondary | ICD-10-CM | POA: Diagnosis not present

## 2021-11-26 ENCOUNTER — Other Ambulatory Visit: Payer: Self-pay

## 2021-11-26 DIAGNOSIS — H6123 Impacted cerumen, bilateral: Secondary | ICD-10-CM | POA: Diagnosis not present

## 2021-11-26 DIAGNOSIS — R197 Diarrhea, unspecified: Secondary | ICD-10-CM

## 2021-11-26 DIAGNOSIS — F4310 Post-traumatic stress disorder, unspecified: Secondary | ICD-10-CM | POA: Diagnosis not present

## 2021-11-26 NOTE — Telephone Encounter (Signed)
Left message for pt to call back on home and cell phone.

## 2021-11-26 NOTE — Telephone Encounter (Signed)
I have seen him very infrequently, and to the best of my knowledge not for diarrhea.  Who has he been seen for this problem? He needs stools for enteric pathogens including C. difficile and an office appointment.  Can be with one of the advanced practitioners or myself.  Which ever sooner.  Thanks

## 2021-11-26 NOTE — Telephone Encounter (Signed)
Pt states he has had an [place] and a CT done recently but he keeps having abd pains/cramping. Reports since Friday he has been having terrible diarrhea, 3-4 times/day. He had not been on any antibiotics recently. States that it even wakes him up at night to go to the restroom and then he has diarrhea. Pt wants to know what Dr. [name] recommends. Please advise.

## 2021-11-26 NOTE — Telephone Encounter (Signed)
Spoke with pt and he states his PCP Dr. [name] had ordered the CT and [place]. He is aware of Dr. Lamar Sprinkles recommendations and will come for lab, order in epic. Pt scheduled to see Quentin Mulling PA 12/10/21 at 1:30pm. Pt aware of appt.

## 2021-11-27 ENCOUNTER — Other Ambulatory Visit: Payer: Medicare PPO

## 2021-11-28 ENCOUNTER — Other Ambulatory Visit: Payer: Medicare PPO

## 2021-11-28 DIAGNOSIS — R197 Diarrhea, unspecified: Secondary | ICD-10-CM

## 2021-11-29 DIAGNOSIS — M25551 Pain in right hip: Secondary | ICD-10-CM | POA: Diagnosis not present

## 2021-11-29 DIAGNOSIS — M7061 Trochanteric bursitis, right hip: Secondary | ICD-10-CM | POA: Diagnosis not present

## 2021-11-29 DIAGNOSIS — M1711 Unilateral primary osteoarthritis, right knee: Secondary | ICD-10-CM | POA: Diagnosis not present

## 2021-12-01 LAB — GI PROFILE, STOOL, PCR

## 2021-12-04 ENCOUNTER — Telehealth: Payer: Self-pay

## 2021-12-04 NOTE — Telephone Encounter (Signed)
I am aware. Please see my result note. Thank you very much for the notification.

## 2021-12-04 NOTE — Telephone Encounter (Signed)
Received call from [facility] that patient's stool test is positive for Norovirus GI.

## 2021-12-09 NOTE — Progress Notes (Unsigned)
12/10/2021 Star Age [id number] 1946/03/30  Referring provider: Lujean Amel, MD Primary GI doctor: Dr. [name]  ASSESSMENT AND PLAN:   Diarrhea of infectious origin 06/12/2020 colonoscopy showed internal hemorrhoids, diverticulosis, no repeat colonoscopy Positive Norovirus-This has resolved  Upper AB cramping with nausea no vomiting since March 2023 06/29/2019 endoscopy for abnormal imaging possible ampullary mass versus periampullary diverticulum showed normal esophagus normal stomach, normal duodenum duodenal diverticulum. Had recent unremarkable AB [place] and CT AB and pelvis with contrast with PCP in May for same complaint. Patient reassured, no alarm symptoms.  Has had increase beltching/gas-? Gas- Will stop milk, given FODMAP,  Try lidocaine patch for possible musculoskeletal component with ventral hernia/worse with positions If not better can consider repeat EGD  Gastroesophageal reflux disease without esophagitis he reports symptoms are currently well controlled. Lifestyle changes discussed, avoid NSAIDS Continue current medications  Diverticulosis Will call if any symptoms. Add on fiber supplement, avoid NSAIDS, information given    History of Present Illness:  76 y.o. male  with a past medical history of abdominal pain, OSA, avascular necrosis and others listed below, returns to clinic today for evaluation of diarrhea and AB pain.   01/2015 surveillance colonoscopy with Dr.  Rhina Brackett adenoma recall 5 years. 06/29/2019 endoscopy for abnormal imaging possible ampullary mass versus periampullary diverticulum showed normal esophagus normal stomach, normal duodenum duodenal diverticulum. 06/12/2020 colonoscopy showed internal hemorrhoids, diverticulosis, no repeat colonoscopy  Patient called with diarrhea symptoms 11/25/2021, tested positive for norovirus 11/28/2021.  Patient states the Thursday before he started to have diarrhea, was having nocturnal symptoms.  Had lower AB cramping.  No nausea, vomiting, diarrhea 4-5 x a day.  No blood in stool, no one else sick. This has since resolved.   He also separately describes dull ache that goes from left upper AB across to his right since Feb/March.  Was coming and going but then was more constant in May so went to his PCP. Had unremarkable CT.  10/01/2021 RUQ US IMPRESSION: 1. The study is limited due to shadowing bowel gas. In particular, evaluation of the IVC, pancreas, aorta, and gallbladder are limited due to shadowing bowel gas. The gallbladder is relatively well seen however. 2. Within visualized limits, no abnormalities are identified. No cause for the patient's symptoms noted. 10/24/21 CT abdomen pelvis with contrast for periumbilical pain prior umbilical hernia repair showed no acute findings. Got up this AM worked out, ate breakfast, then when he got here, very sporadic, can have nausea with it but no vomiting.  Worse with sitting down like driving back from the beach, several times a week.  No night times symptoms.  He has GERD, takes omeprazole, takes regularly without any issues.  He has had increase burping/beltching, with drinking water/beer/soda.  Rare ibuprofen, no recent dental work, no recent ABX in 6 months.  Started to take vitamin D in Nov/Dec.  He has back pain, sees chiropractor.   Current Medications:    Current Outpatient Medications (Cardiovascular):    sildenafil (REVATIO) 20 MG tablet, Take 20-100 mg by mouth as needed (ED).    simvastatin (ZOCOR) 20 MG tablet, Take 20 mg by mouth every evening.   Current Outpatient Medications (Analgesics):    aspirin 81 MG tablet, Take 81 mg by mouth at bedtime.    ibuprofen (ADVIL,MOTRIN) 200 MG tablet, Take 200-400 mg by mouth every 6 (six) hours as needed for headache or moderate pain.    Current Outpatient Medications (Other):    amoxicillin (AMOXIL) 500 MG  capsule, 500 mg as needed. For Dental work    amphetamine-dextroamphetamine (ADDERALL) 20 MG tablet, Take 1 tablet by mouth daily.   buPROPion (WELLBUTRIN SR) 150 MG 12 hr tablet, Take 150 mg by mouth daily.   Carboxymethylcellulose Sodium (REFRESH TEARS OP), Apply 1 drop to eye 3 (three) times daily as needed (dry eyes).   clindamycin-benzoyl peroxide (BENZACLIN) gel, clindamycin 1 %-benzoyl peroxide 5 % topical gel  APPLY TOPICALLY TO AFFECTED AREAS ONCE DAILY. MAY CAUSE DRYNESS.   Desvenlafaxine Succinate (PRISTIQ PO), Take by mouth.   finasteride (PROSCAR) 5 MG tablet, Take 5 mg by mouth daily.   hyoscyamine (LEVSIN) 0.125 MG tablet, Take 1 tablet (0.125 mg total) by mouth every 6 (six) hours as needed for cramping.   Ivermectin 1 % CREA, Apply topically.   Multiple Vitamin (MULTIVITAMIN) capsule, Take by mouth.   omeprazole (PRILOSEC) 40 MG capsule, Take by mouth.   amphetamine-dextroamphetamine (ADDERALL XR) 20 MG 24 hr capsule, Take 20 mg by mouth 2 (two) times daily. Take 1 in the morning & one in the evening. (Patient not taking: Reported on 12/10/2021)  Surgical History:  He  has a past surgical history that includes Mouth surgery; Tonsillectomy; Umbilical hernia repair (03/01/2013); Total hip arthroplasty (Right, 05/11/2013); Total hip arthroplasty (Left, 09/18/2014); Total shoulder arthroplasty (Left, 05/01/2016); Clavicle surgery (Left, 1980s); Neuroma surgery (Left); Shoulder arthroscopy w/ rotator cuff repair (Right); Elbow surgery (Right); Plantar fascia release (Right); Knee arthroscopy (Right); Excision basal cell carcinoma; Hernia repair; Inguinal hernia repair (Left, 2001); Cataract extraction w/ intraocular lens  implant, bilateral (Bilateral); Closed reduction hand fracture (Left, ~ 1995); Bunionectomy (Left); Total shoulder arthroplasty (Left, 05/01/2016); Joint replacement; Carpal tunnel release (Right); and Colonoscopy ([id number]). Family History:  His family history includes Colon cancer in his paternal grandmother; Colon  polyps in his paternal grandmother; Diabetes in his brother; Heart attack (age of onset: 110) in his father; Heart disease in his father; Lung disease in his mother. Social History:   reports that he has never smoked. He has never used smokeless tobacco. He reports current alcohol use of about 7.0 standard drinks of alcohol per week. He reports that he does not use drugs.  Current Medications, Allergies, Past Medical History, Past Surgical History, Family History and Social History were reviewed in [employer] record.  Physical Exam: BP 100/64   Pulse 63   Ht 5' 10.5" (1.791 m)   Wt 171 lb (77.6 kg)   SpO2 97%   BMI 24.19 kg/m  General:   Pleasant, well developed male in no acute distress Heart : Regular rate and rhythm; no murmurs Pulm: Clear anteriorly; no wheezing Abdomen:  Soft, Obese AB, Active bowel sounds. mild tenderness in the entire abdomen. With guarding and Without rebound,Large ventral hernia, negative carnett's sign No organomegaly appreciated. Rectal: Not evaluated Extremities:  without  edema. Neurologic:  Alert and  oriented x4;  No focal deficits.  Psych:  Cooperative. Normal mood and affect.   Vladimir Crofts, PA-C 12/10/21

## 2021-12-10 ENCOUNTER — Ambulatory Visit: Payer: Medicare PPO | Admitting: Physician Assistant

## 2021-12-10 ENCOUNTER — Encounter: Payer: Self-pay | Admitting: Physician Assistant

## 2021-12-10 VITALS — BP 100/64 | HR 63 | Ht 70.5 in | Wt 171.0 lb

## 2021-12-10 DIAGNOSIS — A09 Infectious gastroenteritis and colitis, unspecified: Secondary | ICD-10-CM

## 2021-12-10 DIAGNOSIS — K219 Gastro-esophageal reflux disease without esophagitis: Secondary | ICD-10-CM | POA: Diagnosis not present

## 2021-12-10 DIAGNOSIS — R109 Unspecified abdominal pain: Secondary | ICD-10-CM

## 2021-12-10 DIAGNOSIS — R110 Nausea: Secondary | ICD-10-CM

## 2021-12-10 MED ORDER — HYOSCYAMINE SULFATE 0.125 MG PO TABS: 0.1250 mg | ORAL_TABLET | Freq: Four times a day (QID) | ORAL | 0 refills | Status: DC | PRN

## 2021-12-10 NOTE — Patient Instructions (Addendum)
Abdominal bloating and discomfort may be due to intestinal sensitivity or symptoms of irritable bowel syndrome. To relieve symptoms, avoid:  Broccoli  Baked beans  Cabbage  Carbonated drinks  Cauliflower  Chewing gum  Hard candy Abdominal distention resulting from weak abdominal muscles:  Is better in the morning  Gets worse as the day progresses  Is relieved by lying down Foods to AVOID that are likely to form gas include:  Milk, dairy products, and medications that contain lactose--If your body doesn't produce the enzyme (lactase) to break it down.  Certain vegetables--baked beans, cauliflower, broccoli, cabbage  Certain starches--wheat, oats, corn, potatoes. Rice is a good substitute.   No aleve, ibuprofen, goody powders, as these are antiinflammatories and can cause inflammation in your stomach, increase bleeding risk and cause ulcers.  You can talk with PCP about alternative pain options.  Can do tsalon pas patches are over the counter and voltern gel is topical antiinflammatory that is safe.   Can do IBGard samples to try daily Will give levsin to do as needed.    The main dietary sources of the four groups of FODMAPs include:  Oligosaccharides: Wheat, rye, legumes and various fruits and vegetables, such as garlic and onions.  Disaccharides: Milk, yogurt and soft cheese. Lactose is the main carb.  Monosaccharides: Various fruit including figs and mangoes, and sweeteners such as honey and agave nectar. Fructose is the main carb.  Polyols: Certain fruits and vegetables including blackberries and lychee, as well as some low-calorie sweeteners like those in sugar-free gum.   Keep a food diary. This will help you identify foods that cause symptoms. Write down: What you eat and when. What symptoms you have. When symptoms occur in relation to your meals. Avoid foods that cause symptoms. Talk with your dietitian about other ways to get the same nutrients that are in these  foods. Eat your meals slowly, in a relaxed setting. Aim to eat 5-6 small meals per day. Do not skip meals. Drink enough fluids to keep your urine clear or pale yellow. If dairy products cause your symptoms to flare up, try eating less of them. You might be able to handle yogurt better than other dairy products because it contains bacteria that help with digestion.      I appreciate the  opportunity to care for you  Thank You   [facility]

## 2021-12-10 NOTE — Progress Notes (Signed)
Noted  

## 2021-12-12 DIAGNOSIS — F4310 Post-traumatic stress disorder, unspecified: Secondary | ICD-10-CM | POA: Diagnosis not present

## 2021-12-17 DIAGNOSIS — F4310 Post-traumatic stress disorder, unspecified: Secondary | ICD-10-CM | POA: Diagnosis not present

## 2021-12-24 DIAGNOSIS — F4310 Post-traumatic stress disorder, unspecified: Secondary | ICD-10-CM | POA: Diagnosis not present

## 2021-12-30 DIAGNOSIS — N312 Flaccid neuropathic bladder, not elsewhere classified: Secondary | ICD-10-CM | POA: Diagnosis not present

## 2022-01-07 DIAGNOSIS — N312 Flaccid neuropathic bladder, not elsewhere classified: Secondary | ICD-10-CM | POA: Diagnosis not present

## 2022-01-07 DIAGNOSIS — N62 Hypertrophy of breast: Secondary | ICD-10-CM | POA: Diagnosis not present

## 2022-01-07 DIAGNOSIS — N521 Erectile dysfunction due to diseases classified elsewhere: Secondary | ICD-10-CM | POA: Diagnosis not present

## 2022-01-07 DIAGNOSIS — F4311 Post-traumatic stress disorder, acute: Secondary | ICD-10-CM | POA: Diagnosis not present

## 2022-01-07 DIAGNOSIS — N4340 Spermatocele of epididymis, unspecified: Secondary | ICD-10-CM | POA: Diagnosis not present

## 2022-01-09 DIAGNOSIS — L600 Ingrowing nail: Secondary | ICD-10-CM | POA: Diagnosis not present

## 2022-01-14 DIAGNOSIS — F4311 Post-traumatic stress disorder, acute: Secondary | ICD-10-CM | POA: Diagnosis not present

## 2022-01-16 NOTE — Progress Notes (Unsigned)
01/21/22   01/21/2022 Tanner [name] [id number] [birth year]  Referring provider: Lujean Amel, MD Primary GI doctor: Dr. [name]  ASSESSMENT AND PLAN:   Assessment: 76 y.o. male here for assessment of the following: 1. Gastroesophageal reflux disease without esophagitis   2. Irritable bowel syndrome, unspecified type   3. Personal history of colonic polyps    With history and symptoms most consistent with IBS-post infectious, he has also had decrease in exercise due to heat.  2022 Colonoscopy showed diverticulosis, no recall due to age Jerrye Bushy controlled with medications.   Plan: Continue to avoid lactulose, FODMAP Can do trial of probiotic Can do trial of benefiber/citracel Consider SIBO testing.   Follow up as needed or if any change/worsening symptoms.  Consider HIDA if worse  History of Present Illness:  76 y.o. male  with a past medical history of abdominal pain, OSA, avascular necrosis  and others listed below, returns to clinic today for evaluation upper abdominal pain.  11/28/2021 positive norovirus-had resolved. Upper abdominal discomfort since February/March. 10/22/2021 unremarkable CT Right upper quadrant ultrasound US Abdominal pain worse with sitting, has reflux.  Patient was given FODMAP diet, advised to stop the lactulose, told to try lidocaine patches.  Consider EGD if not improved.  He states when he stopped drinking milk and started with lactate and he has not had any more AB pain. He did eat some fried chicken today and this caused some AB discomfort but better than before.  He has also been evaluating the FODMAP diet and trying to eliminate certain foods.  BM's have been more hard stools with loose stools afterwards. Some AB bloating. This has been new for him, normally has BM daily without issues.  No melena, no hematochezia.  No new medications.   01/2015 surveillance colonoscopy with Dr.  Rhina Brackett adenoma recall 5 years. 06/29/2019 endoscopy for  abnormal imaging possible ampullary mass versus periampullary diverticulum showed normal esophagus normal stomach, normal duodenum duodenal diverticulum. 06/12/2020 colonoscopy showed internal hemorrhoids, diverticulosis, no repeat colonoscopy   He  reports that he has never smoked. He has never used smokeless tobacco. He reports current alcohol use of about 7.0 standard drinks of alcohol per week. He reports that he does not use drugs. His family history includes Colon cancer in his paternal grandmother; Colon polyps in his paternal grandmother; Diabetes in his brother; Heart attack (age of onset: 33) in his father; Heart disease in his father; Lung disease in his mother.   Current Medications:    Current Outpatient Medications (Cardiovascular):    sildenafil (REVATIO) 20 MG tablet, Take 20-100 mg by mouth as needed (ED).    simvastatin (ZOCOR) 20 MG tablet, Take 20 mg by mouth every evening.   Current Outpatient Medications (Analgesics):    aspirin 81 MG tablet, Take 81 mg by mouth at bedtime.    ibuprofen (ADVIL,MOTRIN) 200 MG tablet, Take 200-400 mg by mouth every 6 (six) hours as needed for headache or moderate pain.    Current Outpatient Medications (Other):    amphetamine-dextroamphetamine (ADDERALL XR) 20 MG 24 hr capsule, Take 20 mg by mouth 2 (two) times daily. Take 1 in the morning & one in the evening.   buPROPion (WELLBUTRIN SR) 150 MG 12 hr tablet, Take 150 mg by mouth daily.   Carboxymethylcellulose Sodium (REFRESH TEARS OP), Apply 1 drop to eye 3 (three) times daily as needed (dry eyes).   clindamycin-benzoyl peroxide (BENZACLIN) gel, clindamycin 1 %-benzoyl peroxide 5 % topical gel  APPLY TOPICALLY TO AFFECTED AREAS  ONCE DAILY. MAY CAUSE DRYNESS.   Desvenlafaxine Succinate (PRISTIQ PO), Take by mouth.   finasteride (PROSCAR) 5 MG tablet, Take 5 mg by mouth daily.   Ivermectin 1 % CREA, Apply topically.   Multiple Vitamin (MULTIVITAMIN) capsule, Take by mouth.    omeprazole (PRILOSEC) 40 MG capsule, Take by mouth.   amoxicillin (AMOXIL) 500 MG capsule, 500 mg as needed. For Dental work (Patient not taking: Reported on 01/21/2022)   amphetamine-dextroamphetamine (ADDERALL) 20 MG tablet, Take 1 tablet by mouth daily. (Patient not taking: Reported on 01/21/2022)   hyoscyamine (LEVSIN) 0.125 MG tablet, Take 1 tablet (0.125 mg total) by mouth every 6 (six) hours as needed for cramping. (Patient not taking: Reported on 01/21/2022)  Surgical History:  He  has a past surgical history that includes Mouth surgery; Tonsillectomy; Umbilical hernia repair (03/01/2013); Total hip arthroplasty (Right, 05/11/2013); Total hip arthroplasty (Left, 09/18/2014); Total shoulder arthroplasty (Left, 05/01/2016); Clavicle surgery (Left, 1980s); Neuroma surgery (Left); Shoulder arthroscopy w/ rotator cuff repair (Right); Elbow surgery (Right); Plantar fascia release (Right); Knee arthroscopy (Right); Excision basal cell carcinoma; Hernia repair; Inguinal hernia repair (Left, 2001); Cataract extraction w/ intraocular lens  implant, bilateral (Bilateral); Closed reduction hand fracture (Left, ~ 1995); Bunionectomy (Left); Total shoulder arthroplasty (Left, 05/01/2016); Joint replacement; Carpal tunnel release (Right); and Colonoscopy ([id number]).  Current Medications, Allergies, Past Medical History, Past Surgical History, Family History and Social History were reviewed in [employer] record.  Physical Exam: BP 122/76   Pulse 71   Ht '5\' 10"'$  (1.778 m)   Wt 173 lb (78.5 kg)   BMI 24.82 kg/m  General:   Pleasant, well developed male in no acute distress Heart : Regular rate and rhythm; no murmurs Pulm: Clear anteriorly; no wheezing Abdomen:  Soft, Obese AB, Active bowel sounds. No tenderness . , No organomegaly appreciated. Rectal: Not evaluated Extremities:  without  edema. Neurologic:  Alert and  oriented x4;  No focal deficits.  Psych:  Cooperative. Normal mood  and affect.   Vladimir Crofts, PA-C 01/21/22

## 2022-01-21 ENCOUNTER — Encounter: Payer: Self-pay | Admitting: Physician Assistant

## 2022-01-21 ENCOUNTER — Ambulatory Visit: Payer: Medicare PPO | Admitting: Physician Assistant

## 2022-01-21 VITALS — BP 122/76 | HR 71 | Ht 70.0 in | Wt 173.0 lb

## 2022-01-21 DIAGNOSIS — F4311 Post-traumatic stress disorder, acute: Secondary | ICD-10-CM | POA: Diagnosis not present

## 2022-01-21 DIAGNOSIS — K589 Irritable bowel syndrome without diarrhea: Secondary | ICD-10-CM

## 2022-01-21 DIAGNOSIS — Z86010 Personal history of colonic polyps: Secondary | ICD-10-CM | POA: Diagnosis not present

## 2022-01-21 DIAGNOSIS — K219 Gastro-esophageal reflux disease without esophagitis: Secondary | ICD-10-CM

## 2022-01-21 NOTE — Progress Notes (Signed)
Noted  

## 2022-01-21 NOTE — Patient Instructions (Signed)
Recommend starting on a fiber supplement, can try metamucil first but if this causes gas/bloating switch to benefiber or citracel, these do not cause gas.  Take with fiber with with a full 8 oz glass of water once a day. This can take 1 month to start helping, so try for at least one month.  Recommend increasing water and physical activity.   - Drink at least 64-80 ounces of water/liquid per day. - Establish a time to try to move your bowels every day.  For many people, this is after a cup of coffee or after a meal such as breakfast. - Sit all of the way back on the toilet keeping your back fairly straight and while sitting up, try to rest the tops of your forearms on your upper thighs.   - Raising your feet with a step stool/squatty potty can be helpful to improve the angle that allows your stool to pass through the rectum. - Relax the rectum feeling it bulge toward the toilet water.  If you feel your rectum raising toward your body, you are contracting rather than relaxing. - Breathe in and slowly exhale. "Belly breath" by expanding your belly towards your belly button. Keep belly expanded as you gently direct pressure down and back to the anus.  A low pitched GRRR sound can assist with increasing intra-abdominal pressure.  - Repeat 3-4 times. If unsuccessful, contract the pelvic floor to restore normal tone and get off the toilet.  Avoid excessive straining. - To reduce excessive wiping by teaching your anus to normally contract, place hands on outer aspect of knees and resist knee movement outward.  Hold 5-10 second then place hands just inside of knees and resist inward movement of knees.  Hold 5 seconds.  Repeat a few times each way.  Go to the ER if unable to pass gas, severe AB pain, unable to hold down food, any shortness of breath of chest pain.  CAN DO TRIAL OF 1 MONTH OF PROBIOTIC IF YOU WISH  Continue lactose free.

## 2022-02-04 DIAGNOSIS — F4311 Post-traumatic stress disorder, acute: Secondary | ICD-10-CM | POA: Diagnosis not present

## 2022-02-11 DIAGNOSIS — F4311 Post-traumatic stress disorder, acute: Secondary | ICD-10-CM | POA: Diagnosis not present

## 2022-02-11 DIAGNOSIS — F4310 Post-traumatic stress disorder, unspecified: Secondary | ICD-10-CM | POA: Diagnosis not present

## 2022-02-17 ENCOUNTER — Telehealth: Payer: Self-pay | Admitting: Physician Assistant

## 2022-02-17 NOTE — Telephone Encounter (Signed)
Inbound call from patient stating that he found the information he was discussing with Estill Bamberg at his last OV regarding Prilosec and dementia. Patient is wanting to Discuss with Estill Bamberg. Please advise.

## 2022-02-17 NOTE — Telephone Encounter (Signed)
Patient stated he would like to discuss with Estill Bamberg, [place] about recent studies regarding omeprazole & dementia. He was unable to message in to [facility] under Amanda's name. I've advised him that he can message in under his primary doctor Dr. [name], and we will forward message to [facility]. Pt verbalized all understanding.

## 2022-02-18 DIAGNOSIS — F4310 Post-traumatic stress disorder, unspecified: Secondary | ICD-10-CM | POA: Diagnosis not present

## 2022-02-18 DIAGNOSIS — F4311 Post-traumatic stress disorder, acute: Secondary | ICD-10-CM | POA: Diagnosis not present

## 2022-02-20 DIAGNOSIS — M1711 Unilateral primary osteoarthritis, right knee: Secondary | ICD-10-CM | POA: Diagnosis not present

## 2022-02-24 DIAGNOSIS — H938X1 Other specified disorders of right ear: Secondary | ICD-10-CM | POA: Diagnosis not present

## 2022-02-25 DIAGNOSIS — F4311 Post-traumatic stress disorder, acute: Secondary | ICD-10-CM | POA: Diagnosis not present

## 2022-02-25 DIAGNOSIS — F4310 Post-traumatic stress disorder, unspecified: Secondary | ICD-10-CM | POA: Diagnosis not present

## 2022-02-27 DIAGNOSIS — M79641 Pain in right hand: Secondary | ICD-10-CM | POA: Diagnosis not present

## 2022-02-27 DIAGNOSIS — M1811 Unilateral primary osteoarthritis of first carpometacarpal joint, right hand: Secondary | ICD-10-CM | POA: Diagnosis not present

## 2022-03-01 DIAGNOSIS — M25561 Pain in right knee: Secondary | ICD-10-CM | POA: Diagnosis not present

## 2022-03-04 DIAGNOSIS — K219 Gastro-esophageal reflux disease without esophagitis: Secondary | ICD-10-CM | POA: Diagnosis not present

## 2022-03-04 DIAGNOSIS — E7800 Pure hypercholesterolemia, unspecified: Secondary | ICD-10-CM | POA: Diagnosis not present

## 2022-03-04 DIAGNOSIS — F4310 Post-traumatic stress disorder, unspecified: Secondary | ICD-10-CM | POA: Diagnosis not present

## 2022-03-04 DIAGNOSIS — E559 Vitamin D deficiency, unspecified: Secondary | ICD-10-CM | POA: Diagnosis not present

## 2022-03-04 DIAGNOSIS — F4311 Post-traumatic stress disorder, acute: Secondary | ICD-10-CM | POA: Diagnosis not present

## 2022-03-04 DIAGNOSIS — K580 Irritable bowel syndrome with diarrhea: Secondary | ICD-10-CM | POA: Diagnosis not present

## 2022-03-04 DIAGNOSIS — Z0000 Encounter for general adult medical examination without abnormal findings: Secondary | ICD-10-CM | POA: Diagnosis not present

## 2022-03-04 DIAGNOSIS — Z79899 Other long term (current) drug therapy: Secondary | ICD-10-CM | POA: Diagnosis not present

## 2022-03-04 DIAGNOSIS — I700 Atherosclerosis of aorta: Secondary | ICD-10-CM | POA: Diagnosis not present

## 2022-03-11 DIAGNOSIS — F4311 Post-traumatic stress disorder, acute: Secondary | ICD-10-CM | POA: Diagnosis not present

## 2022-03-11 DIAGNOSIS — F4310 Post-traumatic stress disorder, unspecified: Secondary | ICD-10-CM | POA: Diagnosis not present

## 2022-03-13 DIAGNOSIS — S83241A Other tear of medial meniscus, current injury, right knee, initial encounter: Secondary | ICD-10-CM | POA: Diagnosis not present

## 2022-03-13 DIAGNOSIS — M25561 Pain in right knee: Secondary | ICD-10-CM | POA: Diagnosis not present

## 2022-03-21 DIAGNOSIS — F4310 Post-traumatic stress disorder, unspecified: Secondary | ICD-10-CM | POA: Diagnosis not present

## 2022-03-25 DIAGNOSIS — F4310 Post-traumatic stress disorder, unspecified: Secondary | ICD-10-CM | POA: Diagnosis not present

## 2022-03-31 ENCOUNTER — Encounter: Payer: Self-pay | Admitting: Cardiovascular Disease

## 2022-03-31 NOTE — Progress Notes (Unsigned)
Cardiology Office Note:    Date:  04/01/2022   ID:  WAGNER TANZI, DOB 10/31/1945, MRN [id number]  PCP:  Lujean Amel, [place] Providers Cardiologist:  Leylani Duley    Referring MD: Vladimir Crofts, PA-C   Chief Complaint  Patient presents with   Hyperlipidemia    History of Present Illness:    Tanner [name] is a 76 y.o. male with a hx of anxiety, hyperlipidemia   Has had HLD for years  Abdominal CT scan showed calcification of his aorta  Was sent here for further evaluataion   Primary MD is Dr. Tonny Bollman,  No CP ,  no dyspnea  Has rare episodes of pectoralis spasm  Exercised regularly until knees    Recent lipid levels reveal Total cholesterol of 209 HDL is 75 LDL is 115 Triglyceride level is 109  In on [place]  Father died at age 71. Uncle had early CAD  Grandfather had heart disease   Still very active with yard work , despite knee pain .       Past Medical History:  Diagnosis Date   ADD (attention deficit disorder)    Allergy    environmental   Anxiety    Arm thromboembolism, superficial, acute    from IV site, left arm   Arthritis    "thumbs; right knee" (05/01/2016)   Arthritis    Atypical mole 01/27/2007   right shoulder slight to moderate   Basal cell carcinoma 12/16/1995   right upper back dr Irean Hong   [facility] (basal cell carcinoma of skin) 04/11/1996   left chest dr Irean Hong   [facility] (basal cell carcinoma of skin) 12/05/2004   post lower left ear mohs   BCC (basal cell carcinoma of skin) 07/14/2012   left post ear tx with bx   BPH (benign prostatic hypertrophy)    Cataract    removed bilat   Depression    Esophageal reflux    Gynecomastia    High cholesterol    IBS (irritable bowel syndrome)    Rosacea    SCC (squamous cell carcinoma) 02/19/2011   right post scalp cx3 cautery   SCC (squamous cell carcinoma) 07/08/2011   middle of scalp tx with bx   SCC (squamous cell carcinoma) 09/19/2013   scalp cx3 73f    SCC (squamous cell carcinoma) 07/20/2016   top scalp cx3 563f  SCC (squamous cell carcinoma) 07/20/2016   right side of neck   SCC (squamous cell carcinoma) 08/18/2017   behind left ear tx with bx   SCC (squamous cell carcinoma) 01/25/2018   left post scalp cx3 [id number] SCC (squamous cell carcinoma) 01/25/2018   right side scalp cx3 [id number] (squamous cell carcinoma) 01/25/2018   mid post scalp cx3 83fu 58fCC (squamous cell carcinoma) 02/22/2019   left front scalp cx3 83fu  38fC (squamous cell carcinoma) 03/17/2019   right front scalp tx with bx   SCC (squamous cell carcinoma) 04/21/2019   top of scalp cx3 83fu   62fep apnea    central sleep apnea, no cpap   Squamous cell carcinoma of skin 02/19/2011   post scalp cx3 cautery with bx    Past Surgical History:  Procedure Laterality Date   BASAL CELL CARCINOMA EXCISION     left ear; right shoulder; top of head"   BUNIONECTOMY Left    CARPAL TUNNEL RELEASE Right    CATARACT EXTRACTION W/ INTRAOCULAR LENS  IMPLANT,  BILATERAL Bilateral    CLAVICLE SURGERY Left 1980s   "cut an inch or so off it"   CLOSED REDUCTION HAND FRACTURE Left ~ 1995   "later took the screws out w/pliers"   COLONOSCOPY  [id number]   Dr [name]   ELBOW SURGERY Right    "had a tear; sewed it back onto the bone"   HERNIA REPAIR     INGUINAL HERNIA REPAIR Left 2001   JOINT REPLACEMENT     KNEE ARTHROSCOPY Right    MOUTH SURGERY     teeth removal/implants placed   NEUROMA SURGERY Left    foot   PLANTAR FASCIA RELEASE Right    SHOULDER ARTHROSCOPY W/ ROTATOR CUFF REPAIR Right    TONSILLECTOMY     TOTAL HIP ARTHROPLASTY Right 05/11/2013   Procedure: RIGHT TOTAL HIP ARTHROPLASTY ANTERIOR APPROACH;  Surgeon: Gearlean Alf, MD;  Location: [facility];  Service: Orthopedics;  Laterality: Right;  WOUND CLASSIFICATION-CLEAN   TOTAL HIP ARTHROPLASTY Left 09/18/2014   Procedure: LEFT TOTAL HIP ARTHROPLASTY ANTERIOR APPROACH;  Surgeon: Gaynelle Arabian, MD;  Location: [facility];   Service: Orthopedics;  Laterality: Left;   TOTAL SHOULDER ARTHROPLASTY Left 05/01/2016   TOTAL SHOULDER ARTHROPLASTY Left 05/01/2016   Procedure: TOTAL SHOULDER ARTHROPLASTY;  Surgeon: Justice Britain, MD;  Location: [place];  Service: Orthopedics;  Laterality: Left;   UMBILICAL HERNIA REPAIR  03/01/2013    Current Medications: Current Meds  Medication Sig   amphetamine-dextroamphetamine (ADDERALL XR) 20 MG 24 hr capsule Take 20 mg by mouth 2 (two) times daily. Take 1 in the morning & one in the evening.   aspirin 81 MG tablet Take 81 mg by mouth at bedtime.    buPROPion (WELLBUTRIN SR) 150 MG 12 hr tablet Take 150 mg by mouth daily.   Carboxymethylcellulose Sodium (REFRESH TEARS OP) Apply 1 drop to eye 3 (three) times daily as needed (dry eyes).   clindamycin-benzoyl peroxide (BENZACLIN) gel clindamycin 1 %-benzoyl peroxide 5 % topical gel  APPLY TOPICALLY TO AFFECTED AREAS ONCE DAILY. MAY CAUSE DRYNESS.   Desvenlafaxine Succinate (PRISTIQ PO) Take by mouth.   finasteride (PROSCAR) 5 MG tablet Take 5 mg by mouth daily.   ibuprofen (ADVIL,MOTRIN) 200 MG tablet Take 200-400 mg by mouth every 6 (six) hours as needed for headache or moderate pain.    Ivermectin 1 % CREA Apply topically.   Multiple Vitamin (MULTIVITAMIN) capsule Take by mouth.   omeprazole (PRILOSEC) 40 MG capsule Take by mouth.   sildenafil (REVATIO) 20 MG tablet Take 20-100 mg by mouth as needed (ED).    simvastatin (ZOCOR) 20 MG tablet Take 20 mg by mouth every evening.     Allergies:   Valium [diazepam]   Social History   Socioeconomic History   Marital status: Widowed    Spouse name: Not on file   Number of children: 2   Years of education: Not on file   Highest education level: Not on file  Occupational History   Occupation: retired  Tobacco Use   Smoking status: Never   Smokeless tobacco: Never  Vaping Use   Vaping Use: Never used  Substance and Sexual Activity   Alcohol use: Yes    Alcohol/week: 7.0  standard drinks of alcohol    Types: 4 Cans of beer, 2 Glasses of wine, 1 Shots of liquor per week    Comment: almost ever day, mostly beer   Drug use: Never   Sexual activity: Yes  Other Topics Concern   Not on  file  Social History Narrative   ** Merged History Encounter **       Resides in [place] Determinants of Health   Financial Resource Strain: Not on file  Food Insecurity: Not on file  Transportation Needs: Not on file  Physical Activity: Not on file  Stress: Not on file  Social Connections: Not on file     Family History: The patient's family history includes Colon cancer in his paternal grandmother; Colon polyps in his paternal grandmother; Diabetes in his brother; Heart attack (age of onset: 45) in his father; Heart disease in his father; Lung disease in his mother. There is no history of Other, Esophageal cancer, Rectal cancer, or Stomach cancer.  ROS:   Please see the history of present illness.     All other systems reviewed and are negative.  EKGs/Labs/Other Studies Reviewed:    The following studies were reviewed today:   EKG: April 01, 2022: Normal sinus rhythm at 60.  Normal EKG.  Recent Labs: No results found for requested labs within last 365 days.  Recent Lipid Panel No results found for: "CHOL", "TRIG", "HDL", "CHOLHDL", "VLDL", "LDLCALC", "LDLDIRECT"   Risk Assessment/Calculations:                Physical Exam:    VS:  BP 116/70   Pulse 60   Ht 5' 10.5" (1.791 m)   Wt 171 lb 6.4 oz (77.7 kg)   SpO2 99%   BMI 24.25 kg/m     Wt Readings from Last 3 Encounters:  04/01/22 171 lb 6.4 oz (77.7 kg)  01/21/22 173 lb (78.5 kg)  12/10/21 171 lb (77.6 kg)     GEN:  Well nourished, well developed in no acute distress HEENT: Normal NECK: No JVD; No carotid bruits LYMPHATICS: No lymphadenopathy CARDIAC: RRR, no murmurs, rubs, gallops RESPIRATORY:  Clear to auscultation without rales, wheezing or rhonchi  ABDOMEN: Soft,  non-tender, non-distended MUSCULOSKELETAL:  No edema; No deformity  SKIN: Warm and dry NEUROLOGIC:  Alert and oriented x 3 PSYCHIATRIC:  Normal affect   ASSESSMENT:    1. Coronary artery calcification   2. Mixed hyperlipidemia    PLAN:      Hyperlipidemia: Godric  presents for further evaluation of his hyperlipidemia and evidence of calcifications on his aorta. He is not having the episodes of angina.  He is fairly active but is limited by knee pain.   If his coronary calcium score is elevated, we will consider changing his simvastatin to rosuvastatin.  He will let us know if he develops any episodes of chest discomfort.  I will see him back in the office in 6 months for a follow-up visit.            Medication Adjustments/Labs and Tests Ordered: Current medicines are reviewed at length with the patient today.  Concerns regarding medicines are outlined above.  Orders Placed This Encounter  Procedures   CT CARDIAC SCORING (SELF PAY ONLY)   Lipoprotein A (LPA)   EKG 12-Lead   No orders of the defined types were placed in this encounter.   Patient Instructions  Body Helix knee brace   Medication Instructions:  Your physician recommends that you continue on your current medications as directed. Please refer to the Current Medication list given to you today.  *If you need a refill on your cardiac medications before your next appointment, please call your pharmacy*   Lab Work: Lipoprotein A today If you have labs (blood  work) drawn today and your tests are completely normal, you will receive your results only by: [place] (if you have MyChart) OR A paper copy in the mail If you have any lab test that is abnormal or we need to change your treatment, we will call you to review the results.   Testing/Procedures: Coronary Calcium Score CT Your physician has requested that you have cardiac CT. Cardiac computed tomography (CT) is a painless test that uses an  x-ray machine to take clear, detailed pictures of your heart. For further information please visit [facility]ugeFiesta[facility]tn. Please follow instruction sheet as given.  Follow-Up: At [facility], you and your health needs are our priority.  As part of our continuing mission to provide you with exceptional heart care, we have created designated Provider Care Teams.  These Care Teams include your primary Cardiologist (physician) and Advanced Practice Providers (APPs -  Physician Assistants and Nurse Practitioners) who all work together to provide you with the care you need, when you need it.  Your next appointment:   6 month(s)  The format for your next appointment:   In Person  Provider:   Mertie Moores, MD    Important Information About Sugar         Signed, Mertie Moores, MD  04/01/2022 10:52 AM    [place]

## 2022-04-01 ENCOUNTER — Ambulatory Visit: Payer: Medicare PPO | Attending: Cardiovascular Disease | Admitting: Cardiovascular Disease

## 2022-04-01 ENCOUNTER — Encounter: Payer: Self-pay | Admitting: Cardiovascular Disease

## 2022-04-01 VITALS — BP 116/70 | HR 60 | Ht 70.5 in | Wt 171.4 lb

## 2022-04-01 DIAGNOSIS — E782 Mixed hyperlipidemia: Secondary | ICD-10-CM | POA: Diagnosis not present

## 2022-04-01 DIAGNOSIS — F4310 Post-traumatic stress disorder, unspecified: Secondary | ICD-10-CM | POA: Diagnosis not present

## 2022-04-01 DIAGNOSIS — I2584 Coronary atherosclerosis due to calcified coronary lesion: Secondary | ICD-10-CM

## 2022-04-01 DIAGNOSIS — I2510 Atherosclerotic heart disease of native coronary artery without angina pectoris: Secondary | ICD-10-CM

## 2022-04-01 DIAGNOSIS — E785 Hyperlipidemia, unspecified: Secondary | ICD-10-CM | POA: Insufficient documentation

## 2022-04-01 NOTE — Patient Instructions (Signed)
Body Helix knee brace   Medication Instructions:  Your physician recommends that you continue on your current medications as directed. Please refer to the Current Medication list given to you today.  *If you need a refill on your cardiac medications before your next appointment, please call your pharmacy*   Lab Work: Lipoprotein A today If you have labs (blood work) drawn today and your tests are completely normal, you will receive your results only by: [place] (if you have MyChart) OR A paper copy in the mail If you have any lab test that is abnormal or we need to change your treatment, we will call you to review the results.   Testing/Procedures: Coronary Calcium Score CT Your physician has requested that you have cardiac CT. Cardiac computed tomography (CT) is a painless test that uses an x-ray machine to take clear, detailed pictures of your heart. For further information please visit [facility]iest[employer]tn. Please follow instruction sheet as given.  Follow-Up: At [facility], you and your health needs are our priority.  As part of our continuing mission to provide you with exceptional heart care, we have created designated Provider Care Teams.  These Care Teams include your primary Cardiologist (physician) and Advanced Practice Providers (APPs -  Physician Assistants and Nurse Practitioners) who all work together to provide you with the care you need, when you need it.  Your next appointment:   6 month(s)  The format for your next appointment:   In Person  Provider:   Mertie Moores, MD    Important Information About Sugar

## 2022-04-02 LAB — LIPOPROTEIN A (LPA): Lipoprotein (a): 88.9 nmol/L — ABNORMAL HIGH (ref <75.0)

## 2022-04-08 DIAGNOSIS — F4310 Post-traumatic stress disorder, unspecified: Secondary | ICD-10-CM | POA: Diagnosis not present

## 2022-04-11 ENCOUNTER — Ambulatory Visit (HOSPITAL_BASED_OUTPATIENT_CLINIC_OR_DEPARTMENT_OTHER)
Admission: RE | Admit: 2022-04-11 | Discharge: 2022-04-11 | Disposition: A | Discharge status: Home / Self Care | Payer: Medicare PPO | Source: Ambulatory Visit | Attending: Cardiovascular Disease | Admitting: Cardiovascular Disease

## 2022-04-11 DIAGNOSIS — L989 Disorder of the skin and subcutaneous tissue, unspecified: Secondary | ICD-10-CM | POA: Diagnosis not present

## 2022-04-11 DIAGNOSIS — E782 Mixed hyperlipidemia: Secondary | ICD-10-CM | POA: Insufficient documentation

## 2022-04-11 DIAGNOSIS — I2510 Atherosclerotic heart disease of native coronary artery without angina pectoris: Secondary | ICD-10-CM | POA: Insufficient documentation

## 2022-04-11 DIAGNOSIS — I2584 Coronary atherosclerosis due to calcified coronary lesion: Secondary | ICD-10-CM | POA: Insufficient documentation

## 2022-04-15 ENCOUNTER — Encounter: Payer: Self-pay | Admitting: Cardiovascular Disease

## 2022-04-15 DIAGNOSIS — E782 Mixed hyperlipidemia: Secondary | ICD-10-CM

## 2022-04-15 DIAGNOSIS — F4310 Post-traumatic stress disorder, unspecified: Secondary | ICD-10-CM | POA: Diagnosis not present

## 2022-04-15 DIAGNOSIS — Z79899 Other long term (current) drug therapy: Secondary | ICD-10-CM

## 2022-04-15 MED ORDER — ROSUVASTATIN CALCIUM 20 MG PO TABS: 20.0000 mg | ORAL_TABLET | Freq: Every day | ORAL | 3 refills | Status: DC

## 2022-04-15 NOTE — Telephone Encounter (Signed)
-----   Message from Thayer Headings, MD sent at 04/14/2022 12:48 PM EST ----- Total calcium score: 589 Percentile: 64th  At his recent office visit, he reported no cardiac symptoms. Will continue with risk factor reduction His LDL goal is < 55. Most recent LDL was 115  DC simvastatin Start Rosuvastatin 20 mg a day Lipids, ALT in 3 months

## 2022-04-28 DIAGNOSIS — F4310 Post-traumatic stress disorder, unspecified: Secondary | ICD-10-CM | POA: Diagnosis not present

## 2022-04-30 DIAGNOSIS — N4340 Spermatocele of epididymis, unspecified: Secondary | ICD-10-CM | POA: Diagnosis not present

## 2022-04-30 DIAGNOSIS — N528 Other male erectile dysfunction: Secondary | ICD-10-CM | POA: Diagnosis not present

## 2022-05-06 DIAGNOSIS — F4310 Post-traumatic stress disorder, unspecified: Secondary | ICD-10-CM | POA: Diagnosis not present

## 2022-05-07 DIAGNOSIS — L570 Actinic keratosis: Secondary | ICD-10-CM | POA: Diagnosis not present

## 2022-05-07 DIAGNOSIS — D0461 Carcinoma in situ of skin of right upper limb, including shoulder: Secondary | ICD-10-CM | POA: Diagnosis not present

## 2022-05-07 DIAGNOSIS — D485 Neoplasm of uncertain behavior of skin: Secondary | ICD-10-CM | POA: Diagnosis not present

## 2022-05-07 DIAGNOSIS — D1801 Hemangioma of skin and subcutaneous tissue: Secondary | ICD-10-CM | POA: Diagnosis not present

## 2022-05-07 DIAGNOSIS — L814 Other melanin hyperpigmentation: Secondary | ICD-10-CM | POA: Diagnosis not present

## 2022-05-07 DIAGNOSIS — L821 Other seborrheic keratosis: Secondary | ICD-10-CM | POA: Diagnosis not present

## 2022-05-07 DIAGNOSIS — L603 Nail dystrophy: Secondary | ICD-10-CM | POA: Diagnosis not present

## 2022-05-07 DIAGNOSIS — D044 Carcinoma in situ of skin of scalp and neck: Secondary | ICD-10-CM | POA: Diagnosis not present

## 2022-05-07 DIAGNOSIS — D0472 Carcinoma in situ of skin of left lower limb, including hip: Secondary | ICD-10-CM | POA: Diagnosis not present

## 2022-05-07 DIAGNOSIS — L578 Other skin changes due to chronic exposure to nonionizing radiation: Secondary | ICD-10-CM | POA: Diagnosis not present

## 2022-05-20 DIAGNOSIS — D044 Carcinoma in situ of skin of scalp and neck: Secondary | ICD-10-CM | POA: Diagnosis not present

## 2022-05-21 DIAGNOSIS — H938X3 Other specified disorders of ear, bilateral: Secondary | ICD-10-CM | POA: Diagnosis not present

## 2022-05-21 DIAGNOSIS — Z974 Presence of external hearing-aid: Secondary | ICD-10-CM | POA: Diagnosis not present

## 2022-05-27 DIAGNOSIS — F4310 Post-traumatic stress disorder, unspecified: Secondary | ICD-10-CM | POA: Diagnosis not present

## 2022-06-03 DIAGNOSIS — F4310 Post-traumatic stress disorder, unspecified: Secondary | ICD-10-CM | POA: Diagnosis not present

## 2022-06-05 ENCOUNTER — Encounter: Payer: Self-pay | Admitting: Cardiovascular Disease

## 2022-06-10 DIAGNOSIS — L738 Other specified follicular disorders: Secondary | ICD-10-CM | POA: Diagnosis not present

## 2022-06-10 DIAGNOSIS — F4310 Post-traumatic stress disorder, unspecified: Secondary | ICD-10-CM | POA: Diagnosis not present

## 2022-06-10 DIAGNOSIS — L570 Actinic keratosis: Secondary | ICD-10-CM | POA: Diagnosis not present

## 2022-06-17 DIAGNOSIS — M948X6 Other specified disorders of cartilage, lower leg: Secondary | ICD-10-CM | POA: Diagnosis not present

## 2022-06-17 DIAGNOSIS — M23221 Derangement of posterior horn of medial meniscus due to old tear or injury, right knee: Secondary | ICD-10-CM | POA: Diagnosis not present

## 2022-06-17 DIAGNOSIS — G8918 Other acute postprocedural pain: Secondary | ICD-10-CM | POA: Diagnosis not present

## 2022-06-24 DIAGNOSIS — F4310 Post-traumatic stress disorder, unspecified: Secondary | ICD-10-CM | POA: Diagnosis not present

## 2022-06-30 DIAGNOSIS — L602 Onychogryphosis: Secondary | ICD-10-CM | POA: Diagnosis not present

## 2022-07-01 DIAGNOSIS — H5201 Hypermetropia, right eye: Secondary | ICD-10-CM | POA: Diagnosis not present

## 2022-07-01 DIAGNOSIS — H10413 Chronic giant papillary conjunctivitis, bilateral: Secondary | ICD-10-CM | POA: Diagnosis not present

## 2022-07-01 DIAGNOSIS — H26493 Other secondary cataract, bilateral: Secondary | ICD-10-CM | POA: Diagnosis not present

## 2022-07-01 DIAGNOSIS — F4310 Post-traumatic stress disorder, unspecified: Secondary | ICD-10-CM | POA: Diagnosis not present

## 2022-07-01 DIAGNOSIS — Z961 Presence of intraocular lens: Secondary | ICD-10-CM | POA: Diagnosis not present

## 2022-07-07 DIAGNOSIS — H903 Sensorineural hearing loss, bilateral: Secondary | ICD-10-CM | POA: Diagnosis not present

## 2022-07-08 DIAGNOSIS — F4310 Post-traumatic stress disorder, unspecified: Secondary | ICD-10-CM | POA: Diagnosis not present

## 2022-07-12 IMAGING — MR MR BRAIN/IAC WO/W CM
12 of 13 series · 38 of 48 positions shown · IV contrast (multihance)
Comparison: None.

CLINICAL DATA: Left ear hearing loss

EXAM:
MRI HEAD WITHOUT AND WITH CONTRAST
TECHNIQUE: Multiplanar, multiecho pulse sequences of the brain and surrounding
structures were obtained without and with intravenous contrast.
CONTRAST:  16mL MULTIHANCE GADOBENATE DIMEGLUMINE 529 MG/ML IV SOLN

[Series 2: T1 · sagittal · 5.0mm · 0.49mm/px · 3 of 26 slices shown (1 of 4)]
[im 1/26]
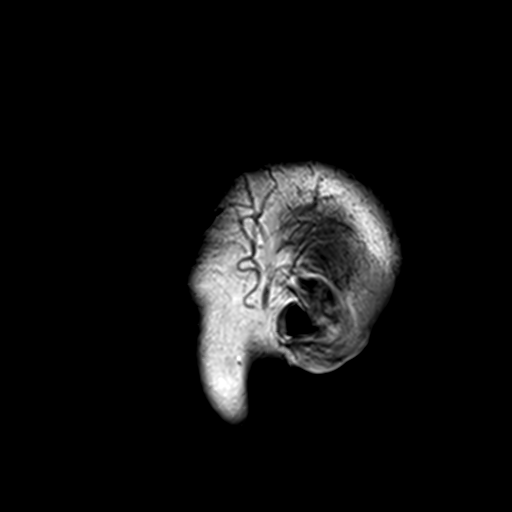
[im 13/26]
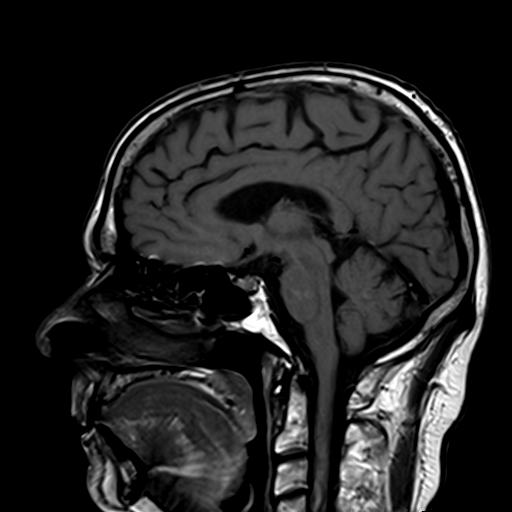
[im 26/26]
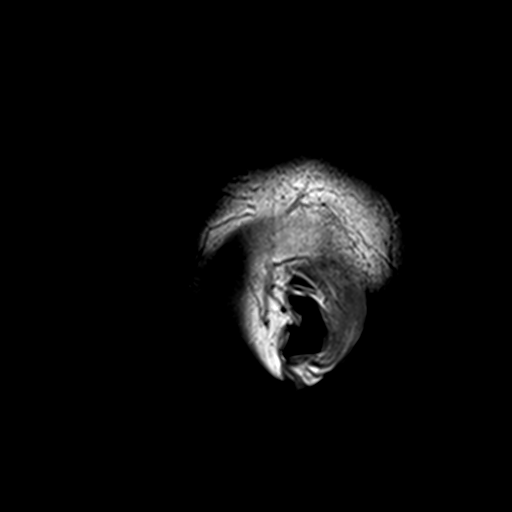

[Series 3: ep2d_diff_3 · axial · 3.0mm · 1.95mm/px · z∈[-36,+113]mm · 8 of 102 slices shown]
[im 1/102]
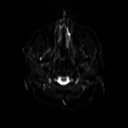
[im 12/102]
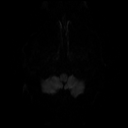
[im 34/102]
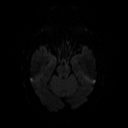
[im 45/102]
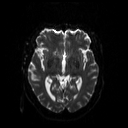
[im 57/102]
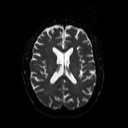
[im 68/102]
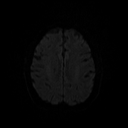
[im 90/102]
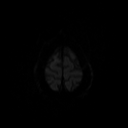
[im 102/102]
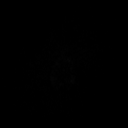

[Series 4: ep2d_diff_3_adc · axial · 3.0mm · 1.95mm/px · z∈[-36,+113]mm · 6 of 51 slices shown]
[im 1/51]
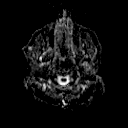
[im 11/51]
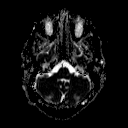
[im 21/51]
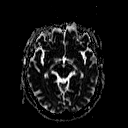
[im 31/51]
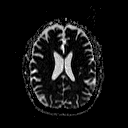
[im 41/51]
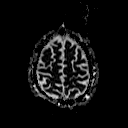
[im 51/51]
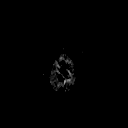

[Series 5: t2_tse_tra_512 · axial · 5.0mm · 0.75mm/px · z∈[-37,+113]mm · 2 of 25 slices shown]
[im 1/25]
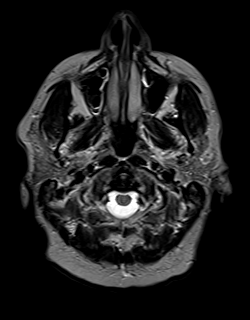
[im 25/25]
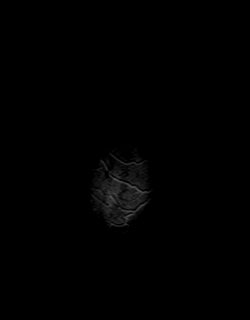

[Series 6: T2 · coronal · 5.0mm · 0.43mm/px · 3 of 30 slices shown]
[im 1/30]
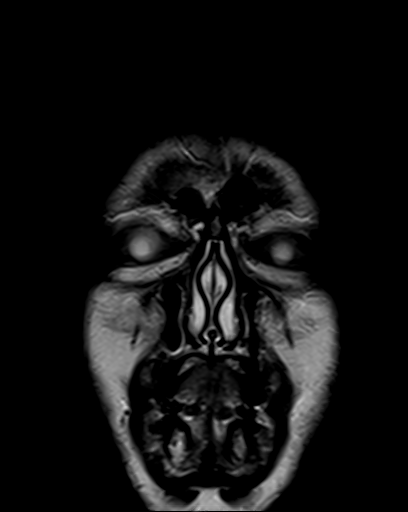
[im 15/30]
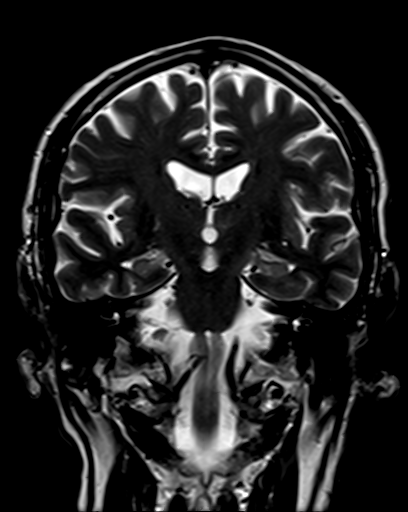
[im 30/30]
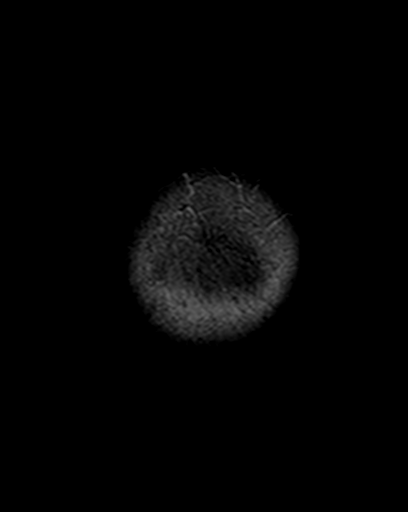

[Series 7: FLAIR · axial · 3.0mm · 0.47mm/px · z∈[-37,+113]mm · 2 of 26 slices shown]
[im 1/26]
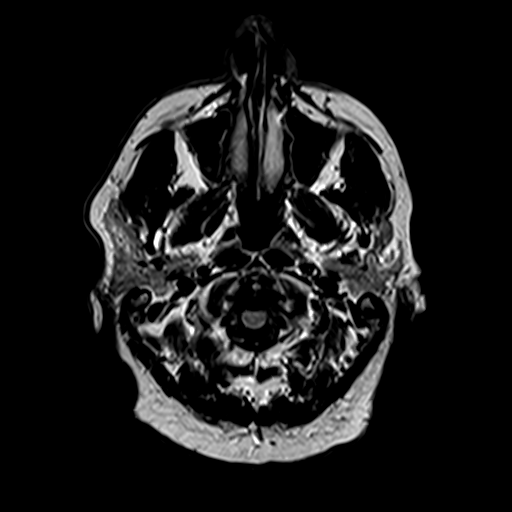
[im 26/26]
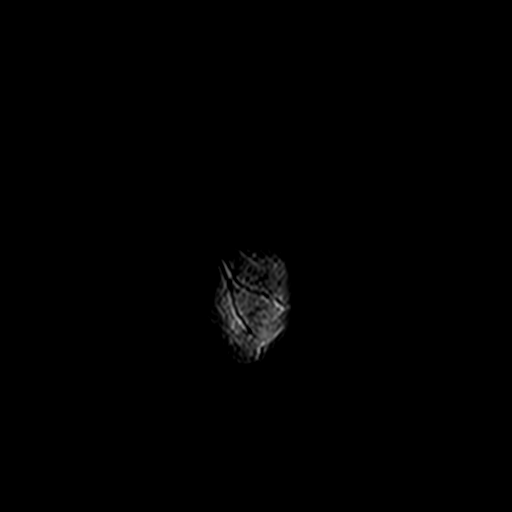

[Series 9: swi_images · axial · 2.0mm · 0.94mm/px · z∈[-41,+117]mm · 7 of 80 slices shown]
[im 1/80]
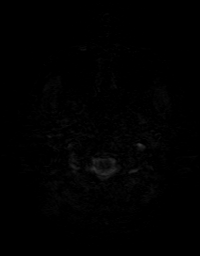
[im 14/80]
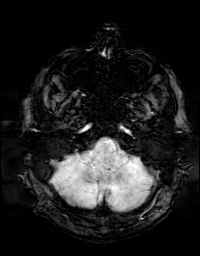
[im 27/80]
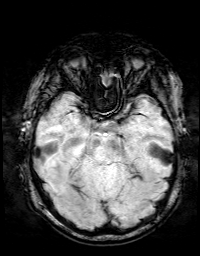
[im 40/80]
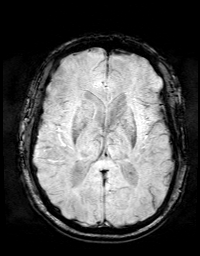
[im 53/80]
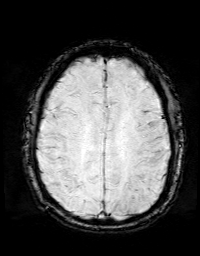
[im 66/80]
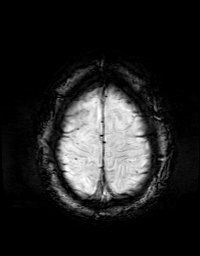
[im 80/80]
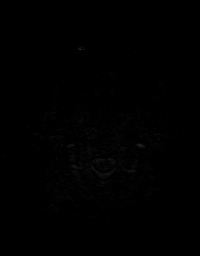

[Series 10: T1 · coronal · 3.0mm · 0.35mm/px · 1 of 13 slices shown (2 of 4)]
[im 1/13]
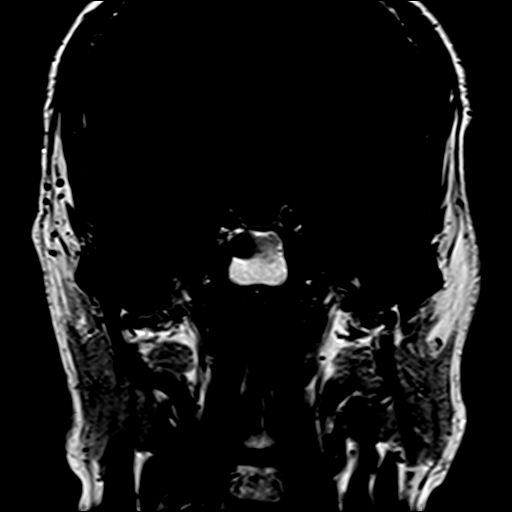

[Series 11: T1 · axial · 3.0mm · 0.35mm/px · 1 of 13 slices shown (3 of 4)]
[im 1/13]
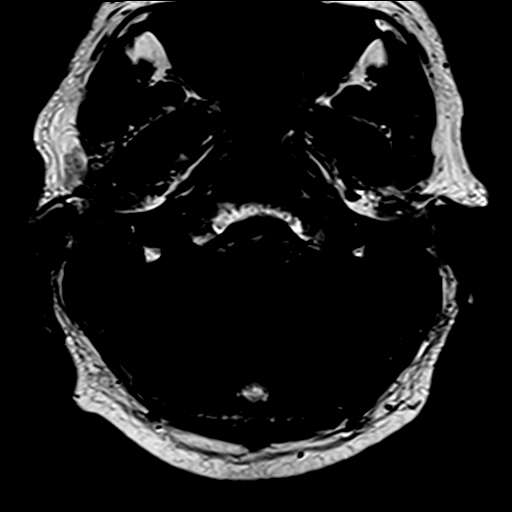

[Series 12: bSSFP · axial · 0.7mm · 0.31mm/px · z∈[-32,-13]mm · 3 of 44 slices shown]
[im 1/44]
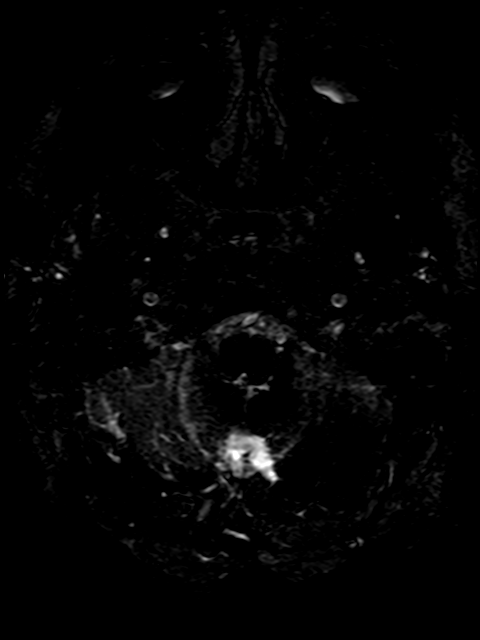
[im 15/44]
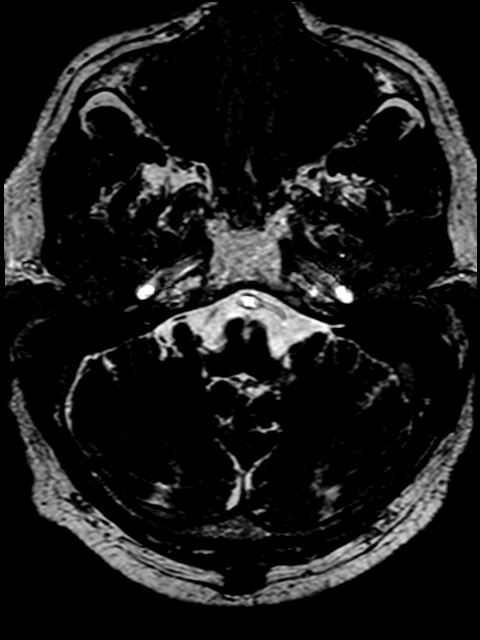
[im 29/44]
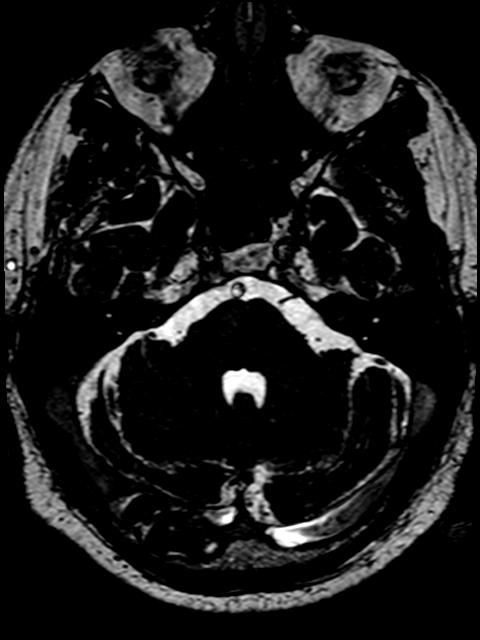

[Series 13: T1 · coronal · 3.0mm · 0.35mm/px · 1 of 13 slices shown (4 of 4)]
[im 1/13]
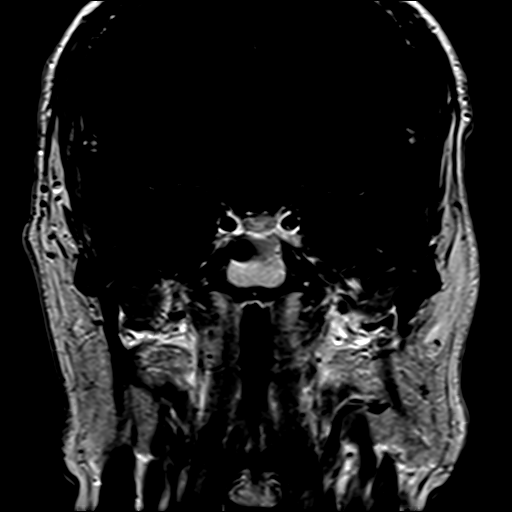

[Series 14: T1 post-contrast · axial · 3.0mm · 0.35mm/px · 1 of 13 slices shown]
[im 1/13]
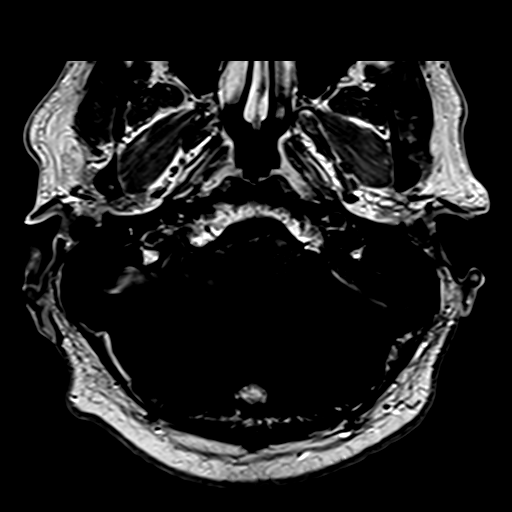

[38 of 48 positions shown; findings below may reference images not displayed]

FINDINGS: Brain: No acute infarct, mass effect or extra-axial collection. No
acute or chronic hemorrhage. Normal white matter signal. Generalized
volume loss without a clear lobar predilection. The midline
structures are normal. There is no cerebellopontine angle mass. The
cochleae and semicircular canals are normal. No focal abnormality
along the course of the 7th and 8th cranial nerves. Normal porus
acusticus and vestibular aqueduct bilaterally.

Vascular: Major flow voids are preserved.

Skull and upper cervical spine: Normal calvarium and skull base.
Visualized upper cervical spine and soft tissues are normal.

Sinuses/Orbits:No paranasal sinus fluid levels or advanced mucosal
thickening. No mastoid or middle ear effusion. Normal orbits.
IMPRESSION: Normal MRI of the brain and internal auditory canals.

## 2022-07-15 DIAGNOSIS — I700 Atherosclerosis of aorta: Secondary | ICD-10-CM | POA: Diagnosis not present

## 2022-07-15 DIAGNOSIS — J209 Acute bronchitis, unspecified: Secondary | ICD-10-CM | POA: Diagnosis not present

## 2022-07-15 DIAGNOSIS — R053 Chronic cough: Secondary | ICD-10-CM | POA: Diagnosis not present

## 2022-07-16 ENCOUNTER — Ambulatory Visit: Payer: Medicare PPO | Attending: Cardiovascular Disease

## 2022-07-16 DIAGNOSIS — Z79899 Other long term (current) drug therapy: Secondary | ICD-10-CM | POA: Diagnosis not present

## 2022-07-16 DIAGNOSIS — E782 Mixed hyperlipidemia: Secondary | ICD-10-CM | POA: Diagnosis not present

## 2022-07-16 DIAGNOSIS — F4310 Post-traumatic stress disorder, unspecified: Secondary | ICD-10-CM | POA: Diagnosis not present

## 2022-07-16 LAB — ALT: ALT: 20 IU/L (ref 0–44)

## 2022-07-16 LAB — LIPID PANEL
Chol/HDL Ratio: 2.1 ratio (ref 0.0–5.0)
Cholesterol, Total: 156 mg/dL (ref 100–199)
HDL: 75 mg/dL (ref >39)
LDL Chol Calc (NIH): 65 mg/dL (ref 0–99)
Triglycerides: 83 mg/dL (ref 0–149)
VLDL Cholesterol Cal: 16 mg/dL (ref 5–40)

## 2022-07-21 ENCOUNTER — Ambulatory Visit: Payer: Medicare PPO | Admitting: Dermatology

## 2022-07-22 DIAGNOSIS — F4310 Post-traumatic stress disorder, unspecified: Secondary | ICD-10-CM | POA: Diagnosis not present

## 2022-07-23 ENCOUNTER — Encounter: Payer: Self-pay | Admitting: Cardiovascular Disease

## 2022-07-28 DIAGNOSIS — M9902 Segmental and somatic dysfunction of thoracic region: Secondary | ICD-10-CM | POA: Diagnosis not present

## 2022-07-28 DIAGNOSIS — M9903 Segmental and somatic dysfunction of lumbar region: Secondary | ICD-10-CM | POA: Diagnosis not present

## 2022-07-28 DIAGNOSIS — M25561 Pain in right knee: Secondary | ICD-10-CM | POA: Diagnosis not present

## 2022-07-28 DIAGNOSIS — M9901 Segmental and somatic dysfunction of cervical region: Secondary | ICD-10-CM | POA: Diagnosis not present

## 2022-07-28 DIAGNOSIS — M9906 Segmental and somatic dysfunction of lower extremity: Secondary | ICD-10-CM | POA: Diagnosis not present

## 2022-07-29 ENCOUNTER — Other Ambulatory Visit: Payer: Self-pay | Admitting: Family Medicine

## 2022-07-29 ENCOUNTER — Ambulatory Visit
Admission: RE | Admit: 2022-07-29 | Discharge: 2022-07-29 | Disposition: A | Discharge status: Home / Self Care | Payer: Medicare PPO | Source: Ambulatory Visit | Attending: Family Medicine | Admitting: Family Medicine

## 2022-07-29 DIAGNOSIS — R059 Cough, unspecified: Secondary | ICD-10-CM | POA: Diagnosis not present

## 2022-07-29 DIAGNOSIS — R058 Other specified cough: Secondary | ICD-10-CM

## 2022-07-30 DIAGNOSIS — M9903 Segmental and somatic dysfunction of lumbar region: Secondary | ICD-10-CM | POA: Diagnosis not present

## 2022-07-30 DIAGNOSIS — M9902 Segmental and somatic dysfunction of thoracic region: Secondary | ICD-10-CM | POA: Diagnosis not present

## 2022-07-30 DIAGNOSIS — M9901 Segmental and somatic dysfunction of cervical region: Secondary | ICD-10-CM | POA: Diagnosis not present

## 2022-08-05 DIAGNOSIS — F4310 Post-traumatic stress disorder, unspecified: Secondary | ICD-10-CM | POA: Diagnosis not present

## 2022-08-06 DIAGNOSIS — M9903 Segmental and somatic dysfunction of lumbar region: Secondary | ICD-10-CM | POA: Diagnosis not present

## 2022-08-06 DIAGNOSIS — M9901 Segmental and somatic dysfunction of cervical region: Secondary | ICD-10-CM | POA: Diagnosis not present

## 2022-08-06 DIAGNOSIS — M9902 Segmental and somatic dysfunction of thoracic region: Secondary | ICD-10-CM | POA: Diagnosis not present

## 2022-08-08 DIAGNOSIS — M9903 Segmental and somatic dysfunction of lumbar region: Secondary | ICD-10-CM | POA: Diagnosis not present

## 2022-08-08 DIAGNOSIS — M9902 Segmental and somatic dysfunction of thoracic region: Secondary | ICD-10-CM | POA: Diagnosis not present

## 2022-08-08 DIAGNOSIS — M9901 Segmental and somatic dysfunction of cervical region: Secondary | ICD-10-CM | POA: Diagnosis not present

## 2022-08-11 DIAGNOSIS — M9901 Segmental and somatic dysfunction of cervical region: Secondary | ICD-10-CM | POA: Diagnosis not present

## 2022-08-11 DIAGNOSIS — M9903 Segmental and somatic dysfunction of lumbar region: Secondary | ICD-10-CM | POA: Diagnosis not present

## 2022-08-11 DIAGNOSIS — M9902 Segmental and somatic dysfunction of thoracic region: Secondary | ICD-10-CM | POA: Diagnosis not present

## 2022-08-13 DIAGNOSIS — M9903 Segmental and somatic dysfunction of lumbar region: Secondary | ICD-10-CM | POA: Diagnosis not present

## 2022-08-13 DIAGNOSIS — M9901 Segmental and somatic dysfunction of cervical region: Secondary | ICD-10-CM | POA: Diagnosis not present

## 2022-08-13 DIAGNOSIS — M9902 Segmental and somatic dysfunction of thoracic region: Secondary | ICD-10-CM | POA: Diagnosis not present

## 2022-08-18 DIAGNOSIS — M9901 Segmental and somatic dysfunction of cervical region: Secondary | ICD-10-CM | POA: Diagnosis not present

## 2022-08-18 DIAGNOSIS — M9902 Segmental and somatic dysfunction of thoracic region: Secondary | ICD-10-CM | POA: Diagnosis not present

## 2022-08-18 DIAGNOSIS — M9903 Segmental and somatic dysfunction of lumbar region: Secondary | ICD-10-CM | POA: Diagnosis not present

## 2022-08-19 DIAGNOSIS — F4310 Post-traumatic stress disorder, unspecified: Secondary | ICD-10-CM | POA: Diagnosis not present

## 2022-08-20 DIAGNOSIS — M9901 Segmental and somatic dysfunction of cervical region: Secondary | ICD-10-CM | POA: Diagnosis not present

## 2022-08-20 DIAGNOSIS — M9903 Segmental and somatic dysfunction of lumbar region: Secondary | ICD-10-CM | POA: Diagnosis not present

## 2022-08-20 DIAGNOSIS — R053 Chronic cough: Secondary | ICD-10-CM | POA: Diagnosis not present

## 2022-08-20 DIAGNOSIS — H6123 Impacted cerumen, bilateral: Secondary | ICD-10-CM | POA: Diagnosis not present

## 2022-08-20 DIAGNOSIS — H903 Sensorineural hearing loss, bilateral: Secondary | ICD-10-CM | POA: Diagnosis not present

## 2022-08-20 DIAGNOSIS — M9902 Segmental and somatic dysfunction of thoracic region: Secondary | ICD-10-CM | POA: Diagnosis not present

## 2022-08-25 DIAGNOSIS — M9903 Segmental and somatic dysfunction of lumbar region: Secondary | ICD-10-CM | POA: Diagnosis not present

## 2022-08-25 DIAGNOSIS — M9902 Segmental and somatic dysfunction of thoracic region: Secondary | ICD-10-CM | POA: Diagnosis not present

## 2022-08-25 DIAGNOSIS — M9901 Segmental and somatic dysfunction of cervical region: Secondary | ICD-10-CM | POA: Diagnosis not present

## 2022-08-26 DIAGNOSIS — Z5189 Encounter for other specified aftercare: Secondary | ICD-10-CM | POA: Diagnosis not present

## 2022-08-27 DIAGNOSIS — C44729 Squamous cell carcinoma of skin of left lower limb, including hip: Secondary | ICD-10-CM | POA: Diagnosis not present

## 2022-08-27 DIAGNOSIS — M9903 Segmental and somatic dysfunction of lumbar region: Secondary | ICD-10-CM | POA: Diagnosis not present

## 2022-08-27 DIAGNOSIS — M9901 Segmental and somatic dysfunction of cervical region: Secondary | ICD-10-CM | POA: Diagnosis not present

## 2022-08-27 DIAGNOSIS — M9902 Segmental and somatic dysfunction of thoracic region: Secondary | ICD-10-CM | POA: Diagnosis not present

## 2022-09-01 DIAGNOSIS — M9902 Segmental and somatic dysfunction of thoracic region: Secondary | ICD-10-CM | POA: Diagnosis not present

## 2022-09-01 DIAGNOSIS — M9903 Segmental and somatic dysfunction of lumbar region: Secondary | ICD-10-CM | POA: Diagnosis not present

## 2022-09-01 DIAGNOSIS — M9901 Segmental and somatic dysfunction of cervical region: Secondary | ICD-10-CM | POA: Diagnosis not present

## 2022-09-02 DIAGNOSIS — F4310 Post-traumatic stress disorder, unspecified: Secondary | ICD-10-CM | POA: Diagnosis not present

## 2022-09-03 DIAGNOSIS — M9901 Segmental and somatic dysfunction of cervical region: Secondary | ICD-10-CM | POA: Diagnosis not present

## 2022-09-03 DIAGNOSIS — M9906 Segmental and somatic dysfunction of lower extremity: Secondary | ICD-10-CM | POA: Diagnosis not present

## 2022-09-03 DIAGNOSIS — M25561 Pain in right knee: Secondary | ICD-10-CM | POA: Diagnosis not present

## 2022-09-03 DIAGNOSIS — M9903 Segmental and somatic dysfunction of lumbar region: Secondary | ICD-10-CM | POA: Diagnosis not present

## 2022-09-03 DIAGNOSIS — M9902 Segmental and somatic dysfunction of thoracic region: Secondary | ICD-10-CM | POA: Diagnosis not present

## 2022-09-08 DIAGNOSIS — M9903 Segmental and somatic dysfunction of lumbar region: Secondary | ICD-10-CM | POA: Diagnosis not present

## 2022-09-08 DIAGNOSIS — M9902 Segmental and somatic dysfunction of thoracic region: Secondary | ICD-10-CM | POA: Diagnosis not present

## 2022-09-08 DIAGNOSIS — M9901 Segmental and somatic dysfunction of cervical region: Secondary | ICD-10-CM | POA: Diagnosis not present

## 2022-09-09 ENCOUNTER — Encounter: Payer: Self-pay | Admitting: Cardiovascular Disease

## 2022-09-09 DIAGNOSIS — F4310 Post-traumatic stress disorder, unspecified: Secondary | ICD-10-CM | POA: Diagnosis not present

## 2022-09-10 DIAGNOSIS — D044 Carcinoma in situ of skin of scalp and neck: Secondary | ICD-10-CM | POA: Diagnosis not present

## 2022-09-10 DIAGNOSIS — D0461 Carcinoma in situ of skin of right upper limb, including shoulder: Secondary | ICD-10-CM | POA: Diagnosis not present

## 2022-09-10 DIAGNOSIS — C4442 Squamous cell carcinoma of skin of scalp and neck: Secondary | ICD-10-CM | POA: Diagnosis not present

## 2022-09-10 DIAGNOSIS — C4492 Squamous cell carcinoma of skin, unspecified: Secondary | ICD-10-CM | POA: Diagnosis not present

## 2022-09-10 DIAGNOSIS — D485 Neoplasm of uncertain behavior of skin: Secondary | ICD-10-CM | POA: Diagnosis not present

## 2022-09-16 DIAGNOSIS — F4310 Post-traumatic stress disorder, unspecified: Secondary | ICD-10-CM | POA: Diagnosis not present

## 2022-09-23 DIAGNOSIS — F4310 Post-traumatic stress disorder, unspecified: Secondary | ICD-10-CM | POA: Diagnosis not present

## 2022-09-25 DIAGNOSIS — I700 Atherosclerosis of aorta: Secondary | ICD-10-CM | POA: Diagnosis not present

## 2022-09-25 DIAGNOSIS — R61 Generalized hyperhidrosis: Secondary | ICD-10-CM | POA: Diagnosis not present

## 2022-09-26 DIAGNOSIS — Z5189 Encounter for other specified aftercare: Secondary | ICD-10-CM | POA: Diagnosis not present

## 2022-09-26 DIAGNOSIS — M170 Bilateral primary osteoarthritis of knee: Secondary | ICD-10-CM | POA: Diagnosis not present

## 2022-09-29 DIAGNOSIS — Z5189 Encounter for other specified aftercare: Secondary | ICD-10-CM | POA: Diagnosis not present

## 2022-09-29 DIAGNOSIS — D0461 Carcinoma in situ of skin of right upper limb, including shoulder: Secondary | ICD-10-CM | POA: Diagnosis not present

## 2022-09-29 DIAGNOSIS — C4442 Squamous cell carcinoma of skin of scalp and neck: Secondary | ICD-10-CM | POA: Diagnosis not present

## 2022-10-07 DIAGNOSIS — F4310 Post-traumatic stress disorder, unspecified: Secondary | ICD-10-CM | POA: Diagnosis not present

## 2022-10-13 DIAGNOSIS — R7401 Elevation of levels of liver transaminase levels: Secondary | ICD-10-CM | POA: Diagnosis not present

## 2022-10-14 DIAGNOSIS — F4310 Post-traumatic stress disorder, unspecified: Secondary | ICD-10-CM | POA: Diagnosis not present

## 2022-10-21 DIAGNOSIS — F4310 Post-traumatic stress disorder, unspecified: Secondary | ICD-10-CM | POA: Diagnosis not present

## 2022-10-23 DIAGNOSIS — M1711 Unilateral primary osteoarthritis, right knee: Secondary | ICD-10-CM | POA: Diagnosis not present

## 2022-10-30 DIAGNOSIS — M1711 Unilateral primary osteoarthritis, right knee: Secondary | ICD-10-CM | POA: Diagnosis not present

## 2022-10-30 DIAGNOSIS — M25561 Pain in right knee: Secondary | ICD-10-CM | POA: Diagnosis not present

## 2022-11-03 ENCOUNTER — Encounter: Payer: Self-pay | Admitting: Cardiovascular Disease

## 2022-11-05 DIAGNOSIS — F4310 Post-traumatic stress disorder, unspecified: Secondary | ICD-10-CM | POA: Diagnosis not present

## 2022-11-06 DIAGNOSIS — M1711 Unilateral primary osteoarthritis, right knee: Secondary | ICD-10-CM | POA: Diagnosis not present

## 2022-11-11 DIAGNOSIS — F4310 Post-traumatic stress disorder, unspecified: Secondary | ICD-10-CM | POA: Diagnosis not present

## 2022-11-18 DIAGNOSIS — F4310 Post-traumatic stress disorder, unspecified: Secondary | ICD-10-CM | POA: Diagnosis not present

## 2022-11-19 DIAGNOSIS — C44719 Basal cell carcinoma of skin of left lower limb, including hip: Secondary | ICD-10-CM | POA: Diagnosis not present

## 2022-11-19 DIAGNOSIS — L219 Seborrheic dermatitis, unspecified: Secondary | ICD-10-CM | POA: Diagnosis not present

## 2022-11-19 DIAGNOSIS — L821 Other seborrheic keratosis: Secondary | ICD-10-CM | POA: Diagnosis not present

## 2022-11-19 DIAGNOSIS — Z8589 Personal history of malignant neoplasm of other organs and systems: Secondary | ICD-10-CM | POA: Diagnosis not present

## 2022-11-19 DIAGNOSIS — L578 Other skin changes due to chronic exposure to nonionizing radiation: Secondary | ICD-10-CM | POA: Diagnosis not present

## 2022-11-19 DIAGNOSIS — D1801 Hemangioma of skin and subcutaneous tissue: Secondary | ICD-10-CM | POA: Diagnosis not present

## 2022-11-19 DIAGNOSIS — D0461 Carcinoma in situ of skin of right upper limb, including shoulder: Secondary | ICD-10-CM | POA: Diagnosis not present

## 2022-11-19 DIAGNOSIS — D485 Neoplasm of uncertain behavior of skin: Secondary | ICD-10-CM | POA: Diagnosis not present

## 2022-11-19 DIAGNOSIS — L570 Actinic keratosis: Secondary | ICD-10-CM | POA: Diagnosis not present

## 2022-11-19 DIAGNOSIS — L814 Other melanin hyperpigmentation: Secondary | ICD-10-CM | POA: Diagnosis not present

## 2022-11-19 DIAGNOSIS — L739 Follicular disorder, unspecified: Secondary | ICD-10-CM | POA: Diagnosis not present

## 2022-11-20 DIAGNOSIS — J3089 Other allergic rhinitis: Secondary | ICD-10-CM | POA: Diagnosis not present

## 2022-11-20 DIAGNOSIS — H6123 Impacted cerumen, bilateral: Secondary | ICD-10-CM | POA: Diagnosis not present

## 2022-11-20 DIAGNOSIS — R490 Dysphonia: Secondary | ICD-10-CM | POA: Diagnosis not present

## 2022-11-24 DIAGNOSIS — R7401 Elevation of levels of liver transaminase levels: Secondary | ICD-10-CM | POA: Diagnosis not present

## 2022-11-25 DIAGNOSIS — F4310 Post-traumatic stress disorder, unspecified: Secondary | ICD-10-CM | POA: Diagnosis not present

## 2022-12-08 IMAGING — US US ABDOMEN COMPLETE
1 series · 13 of 25 positions shown · non-contrast
Comparison: None Available.

CLINICAL DATA: Right upper quadrant pain with nausea. Tenderness
around umbilicus that radiates into the right upper quadrant. The
patient has had symptoms for several months.

EXAM:
ABDOMEN ULTRASOUND COMPLETE

[Series 1: us abdomen complete · 0.20mm/px · 13 of 90 slices shown]
[im 1/90]
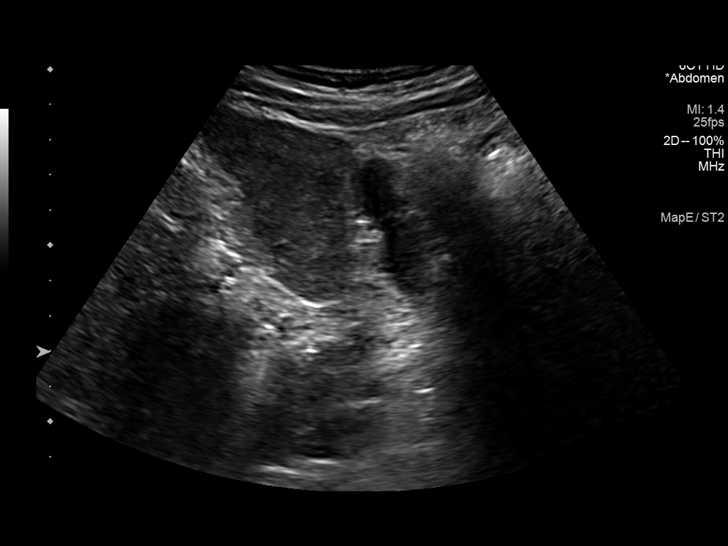
[im 8/90]
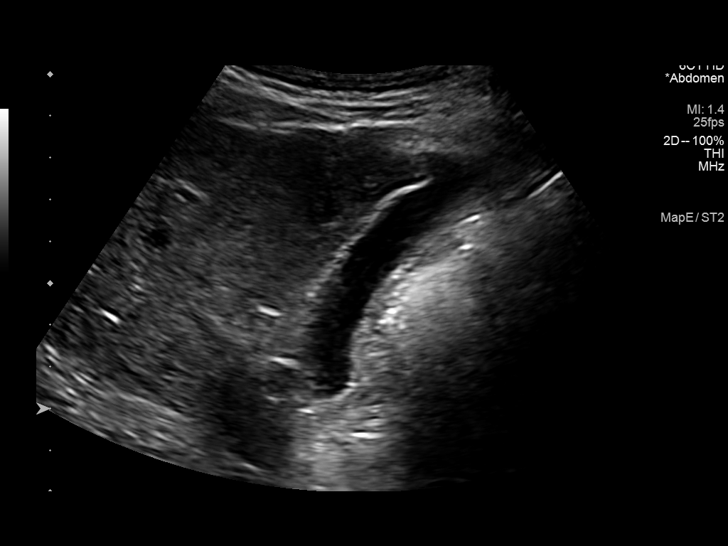
[im 15/90]
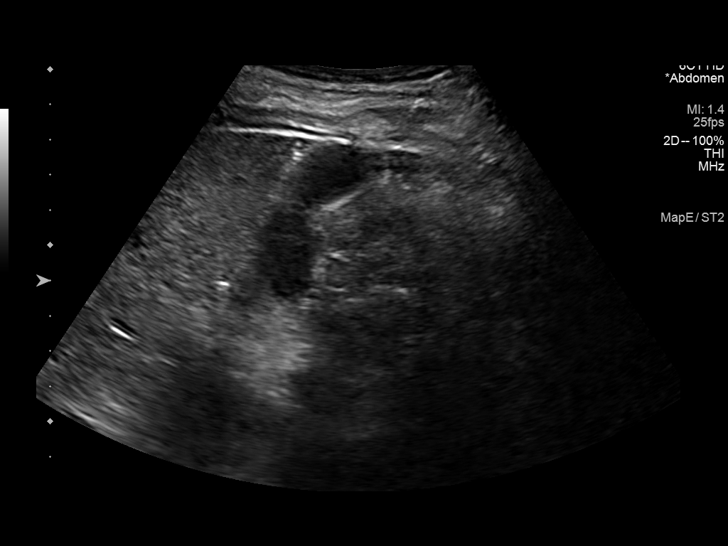
[im 23/90]
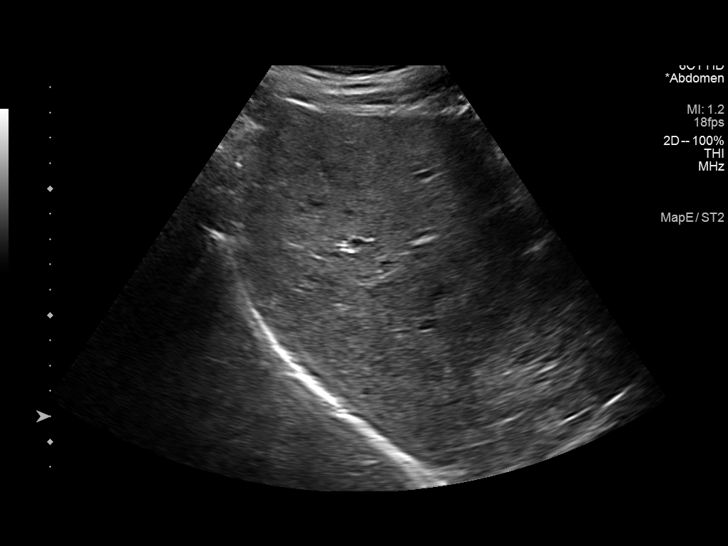
[im 30/90]
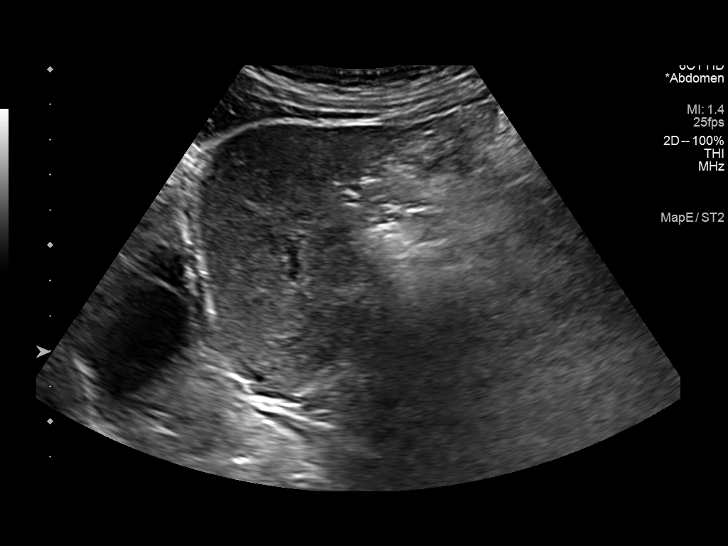
[im 38/90]
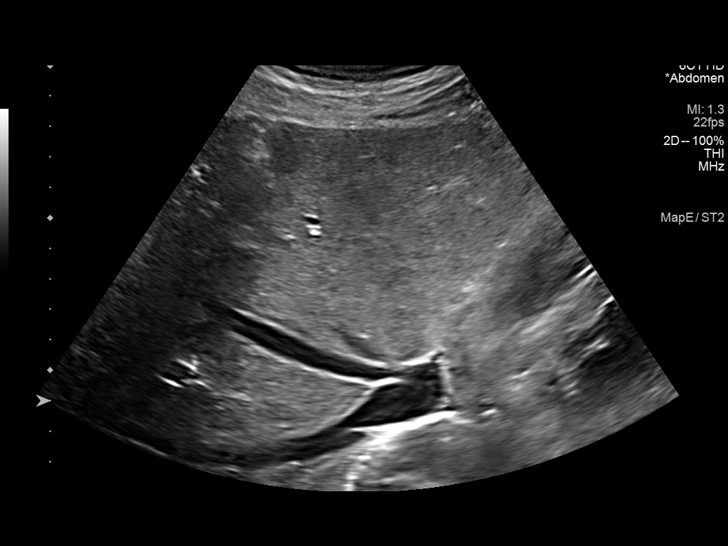
[im 45/90]
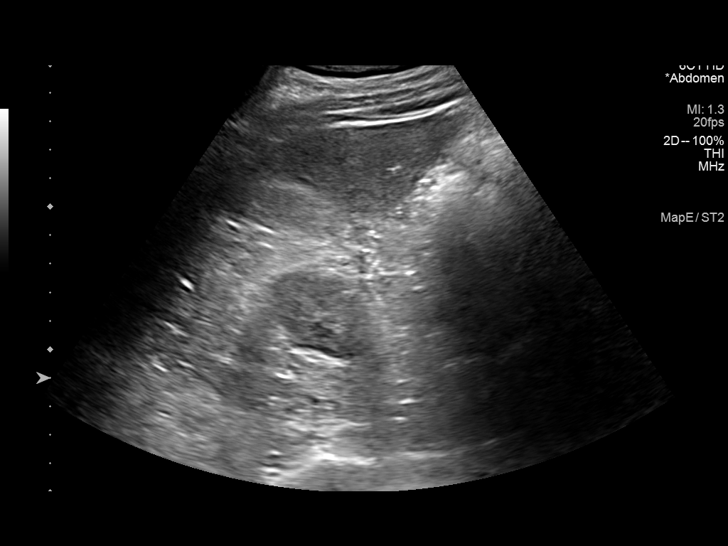
[im 52/90]
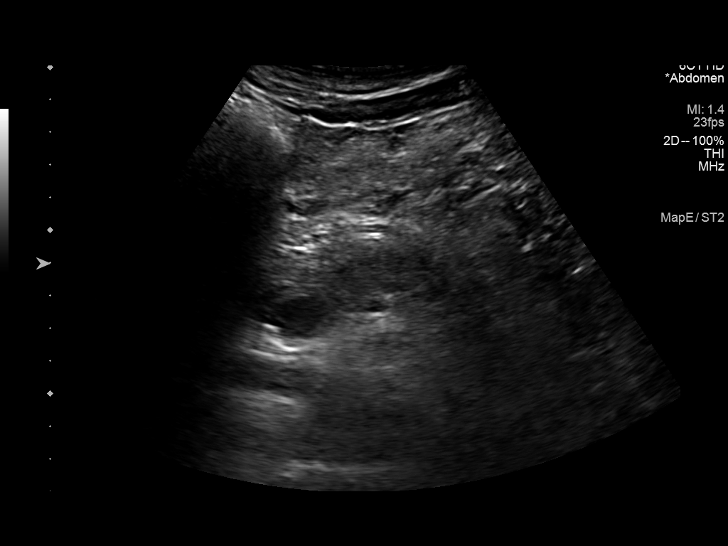
[im 60/90]
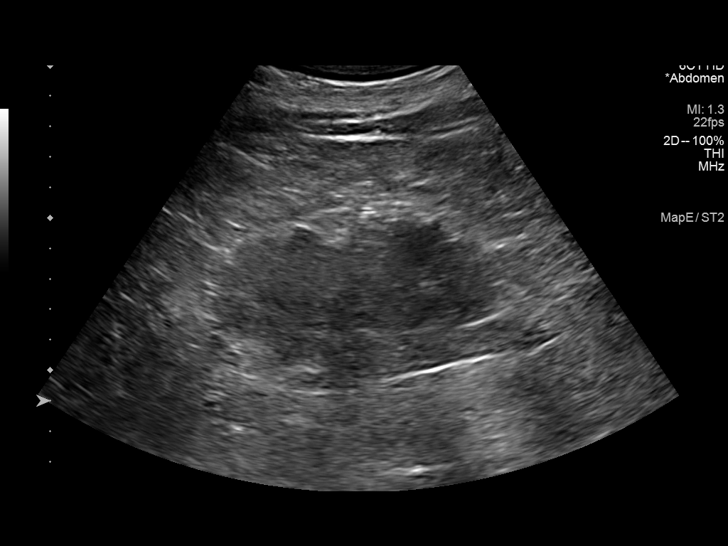
[im 67/90]
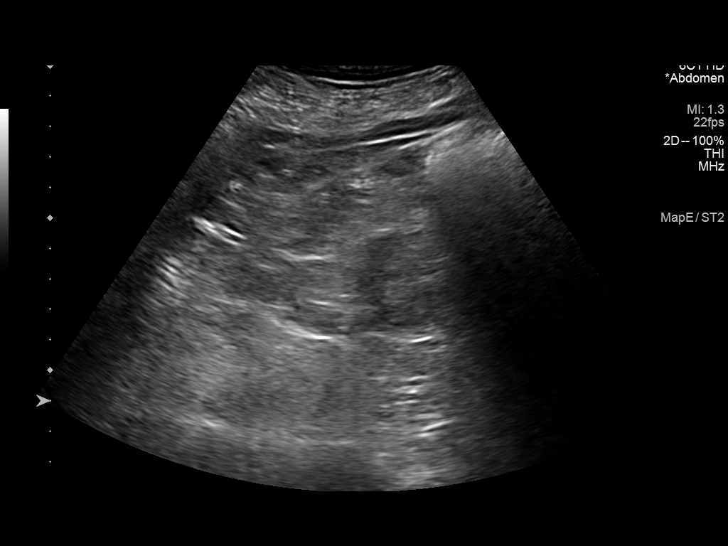
[im 75/90]
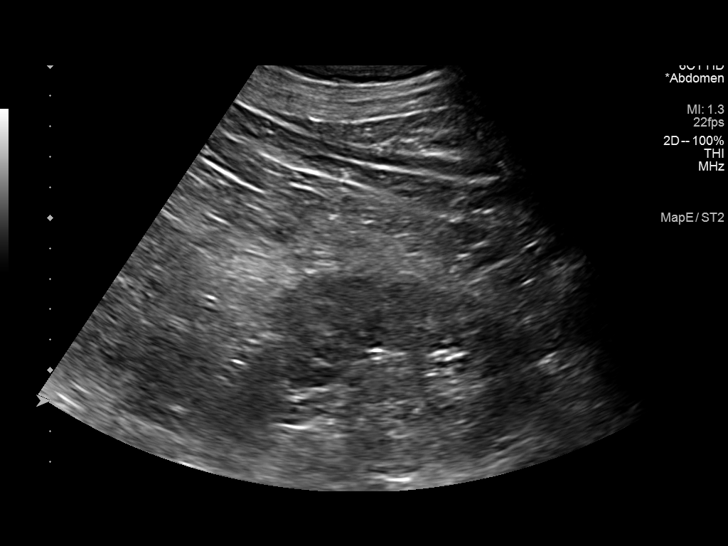
[im 82/90]
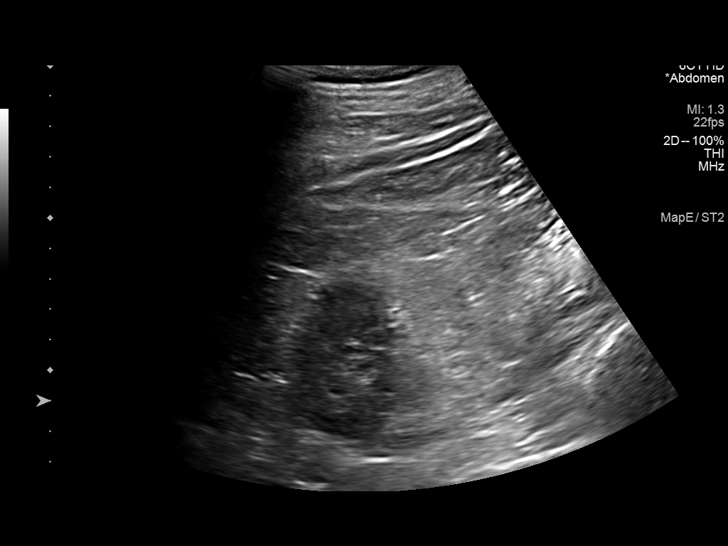
[im 90/90]
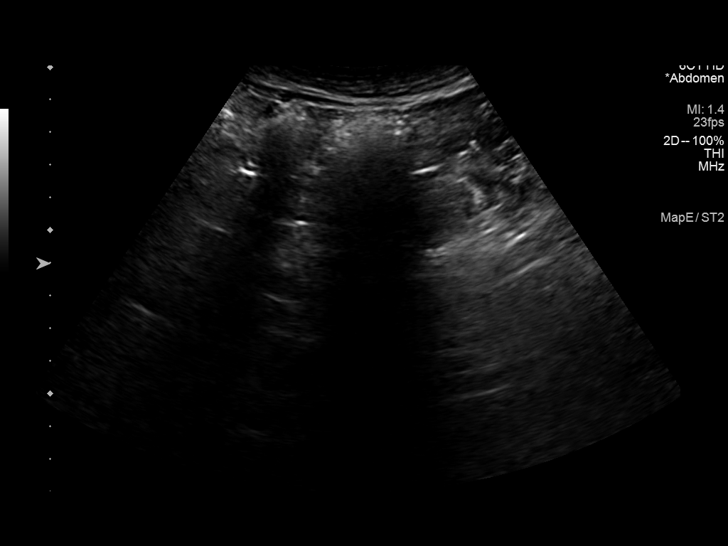

[13 of 25 positions shown; findings below may reference images not displayed]

FINDINGS: Gallbladder: No gallstones or wall thickening visualized. No
sonographic Murphy sign noted by sonographer.

Common bile duct: Diameter: 1.7 mm

Liver: No focal lesion identified. Within normal limits in
parenchymal echogenicity. Portal vein is patent on color Doppler
imaging with normal direction of blood flow towards the liver.

IVC: Limited evaluation due to shadowing bowel gas. No obvious
abnormalities.

Pancreas: Limited evaluation due to shadowing bowel gas. No obvious
abnormalities.

Spleen: Size and appearance within normal limits.

Right Kidney: Length: 10.2 cm. Echogenicity within normal limits. No
mass or hydronephrosis visualized.

Left Kidney: Length: 9.8 cm. Echogenicity within normal limits. No
mass or hydronephrosis visualized.

Abdominal aorta: Limited evaluation due to shadowing bowel gas. No
obvious aneurysm.

Other findings: None.
IMPRESSION: 1. The study is limited due to shadowing bowel gas. In particular,
evaluation of the IVC, pancreas, aorta, and gallbladder are limited
due to shadowing bowel gas. The gallbladder is relatively well seen
however.
2. Within visualized limits, no abnormalities are identified. No
cause for the patient's symptoms noted.

## 2022-12-17 DIAGNOSIS — M1711 Unilateral primary osteoarthritis, right knee: Secondary | ICD-10-CM | POA: Diagnosis not present

## 2022-12-30 ENCOUNTER — Ambulatory Visit: Payer: Medicare PPO | Attending: Cardiovascular Disease | Admitting: Cardiovascular Disease

## 2022-12-30 ENCOUNTER — Encounter: Payer: Self-pay | Admitting: Cardiovascular Disease

## 2022-12-30 VITALS — BP 106/74 | HR 61 | Ht 70.5 in | Wt 169.6 lb

## 2022-12-30 DIAGNOSIS — E782 Mixed hyperlipidemia: Secondary | ICD-10-CM | POA: Diagnosis not present

## 2022-12-30 DIAGNOSIS — I2510 Atherosclerotic heart disease of native coronary artery without angina pectoris: Secondary | ICD-10-CM

## 2022-12-30 DIAGNOSIS — F4310 Post-traumatic stress disorder, unspecified: Secondary | ICD-10-CM | POA: Diagnosis not present

## 2022-12-30 DIAGNOSIS — I2584 Coronary atherosclerosis due to calcified coronary lesion: Secondary | ICD-10-CM | POA: Diagnosis not present

## 2022-12-30 NOTE — Patient Instructions (Signed)

## 2022-12-30 NOTE — Progress Notes (Unsigned)
Cardiology Office Note:    Date:  12/30/2022   ID:  Tanner [name], DOB [birth year], MRN [id number]  PCP:  Darrow Bussing, MD   [facility] Providers Cardiologist:  Abbigal Radich    Referring MD: Darrow Bussing, MD   Chief Complaint  Patient presents with   coronary artery calcification     History of Present Illness:    Tanner [name] is a 77 y.o. male with a hx of anxiety, hyperlipidemia   Has had HLD for years  Abdominal CT scan showed calcification of his aorta  Was sent here for further evaluataion   Primary MD is Dr. Peterson Ao,  No CP ,  no dyspnea  Has rare episodes of pectoralis spasm  Exercised regularly until knees    Recent lipid levels reveal Total cholesterol of 209 HDL is 75 LDL is 115 Triglyceride level is 109  In on [place]  Father died at age 39. Uncle had early CAD  Grandfather had heart disease   Still very active with yard work , despite knee pain .   December 30, 2022:   Tanner [name] is seen for follow-up of his coronary artery calcifications. His LP(a) is 88.9.  His last LDL is 65.  Overall is doing well   No CP , no dyspnea   Does not Exercise regularly  Limited by his knee surgery    Past Medical History:  Diagnosis Date   ADD (attention deficit disorder)    Allergy    environmental   Anxiety    Arm thromboembolism, superficial, acute    from IV site, left arm   Arthritis    "thumbs; right knee" (05/01/2016)   Arthritis    Atypical mole 01/27/2007   right shoulder slight to moderate   Basal cell carcinoma 12/16/1995   right upper back dr Alma Friendly   [facility] (basal cell carcinoma of skin) 04/11/1996   left chest dr Alma Friendly   [facility] (basal cell carcinoma of skin) 12/05/2004   post lower left ear mohs   BCC (basal cell carcinoma of skin) 07/14/2012   left post ear tx with bx   BPH (benign prostatic hypertrophy)    Cataract    removed bilat   Depression    Esophageal reflux    Gynecomastia    High cholesterol    IBS (irritable  bowel syndrome)    Rosacea    SCC (squamous cell carcinoma) 02/19/2011   right post scalp cx3 cautery   SCC (squamous cell carcinoma) 07/08/2011   middle of scalp tx with bx   SCC (squamous cell carcinoma) 09/19/2013   scalp cx3 62fu   SCC (squamous cell carcinoma) 07/20/2016   top scalp cx3 34fu   SCC (squamous cell carcinoma) 07/20/2016   right side of neck   SCC (squamous cell carcinoma) 08/18/2017   behind left ear tx with bx   SCC (squamous cell carcinoma) 01/25/2018   left post scalp cx3 24fu   SCC (squamous cell carcinoma) 01/25/2018   right side scalp cx3 69fu   SCC (squamous cell carcinoma) 01/25/2018   mid post scalp cx3 19fu   SCC (squamous cell carcinoma) 02/22/2019   left front scalp cx3 68fu   SCC (squamous cell carcinoma) 03/17/2019   right front scalp tx with bx   SCC (squamous cell carcinoma) 04/21/2019   top of scalp cx3 57fu   Sleep apnea    central sleep apnea, no cpap   Squamous cell carcinoma of skin 02/19/2011   post scalp cx3 cautery  with bx    Past Surgical History:  Procedure Laterality Date   BASAL CELL CARCINOMA EXCISION     left ear; right shoulder; top of head"   BUNIONECTOMY Left    CARPAL TUNNEL RELEASE Right    CATARACT EXTRACTION W/ INTRAOCULAR LENS  IMPLANT, BILATERAL Bilateral    CLAVICLE SURGERY Left 1980s   "cut an inch or so off it"   CLOSED REDUCTION HAND FRACTURE Left ~ 1995   "later took the screws out w/pliers"   COLONOSCOPY  [id number]   Dr [name]   ELBOW SURGERY Right    "had a tear; sewed it back onto the bone"   HERNIA REPAIR     INGUINAL HERNIA REPAIR Left 2001   JOINT REPLACEMENT     KNEE ARTHROSCOPY Right    MOUTH SURGERY     teeth removal/implants placed   NEUROMA SURGERY Left    foot   PLANTAR FASCIA RELEASE Right    SHOULDER ARTHROSCOPY W/ ROTATOR CUFF REPAIR Right    TONSILLECTOMY     TOTAL HIP ARTHROPLASTY Right 05/11/2013   Procedure: RIGHT TOTAL HIP ARTHROPLASTY ANTERIOR APPROACH;  Surgeon: Loanne Drilling, MD;   Location: [facility];  Service: Orthopedics;  Laterality: Right;  WOUND CLASSIFICATION-CLEAN   TOTAL HIP ARTHROPLASTY Left 09/18/2014   Procedure: LEFT TOTAL HIP ARTHROPLASTY ANTERIOR APPROACH;  Surgeon: Ollen Gross, MD;  Location: [facility];  Service: Orthopedics;  Laterality: Left;   TOTAL SHOULDER ARTHROPLASTY Left 05/01/2016   TOTAL SHOULDER ARTHROPLASTY Left 05/01/2016   Procedure: TOTAL SHOULDER ARTHROPLASTY;  Surgeon: Francena Hanly, MD;  Location: [facility];  Service: Orthopedics;  Laterality: Left;   UMBILICAL HERNIA REPAIR  03/01/2013    Current Medications: Current Meds  Medication Sig   amoxicillin (AMOXIL) 500 MG tablet Take 500 mg by mouth 3 (three) times daily.   amphetamine-dextroamphetamine (ADDERALL XR) 20 MG 24 hr capsule Take 20 mg by mouth 2 (two) times daily. Take 1 in the morning & one in the evening.   aspirin 81 MG tablet Take 81 mg by mouth at bedtime.    buPROPion (WELLBUTRIN SR) 150 MG 12 hr tablet Take 150 mg by mouth daily.   Carboxymethylcellulose Sodium (REFRESH TEARS OP) Apply 1 drop to eye 3 (three) times daily as needed (dry eyes).   clindamycin-benzoyl peroxide (BENZACLIN) gel clindamycin 1 %-benzoyl peroxide 5 % topical gel  APPLY TOPICALLY TO AFFECTED AREAS ONCE DAILY. MAY CAUSE DRYNESS.   Desvenlafaxine Succinate (PRISTIQ PO) Take by mouth.   Dextromethorphan-Bupropion (AUVELITY PO) Take 1 tablet by mouth daily.   finasteride (PROSCAR) 5 MG tablet Take 5 mg by mouth daily.   ibuprofen (ADVIL,MOTRIN) 200 MG tablet Take 200-400 mg by mouth every 6 (six) hours as needed for headache or moderate pain.    Ivermectin 1 % CREA Apply topically.   Multiple Vitamin (MULTIVITAMIN) capsule Take by mouth.   omeprazole (PRILOSEC) 40 MG capsule Take by mouth.   rosuvastatin (CRESTOR) 20 MG tablet Take 1 tablet (20 mg total) by mouth daily.   sildenafil (REVATIO) 20 MG tablet Take 20-100 mg by mouth as needed (ED).      Allergies:   Valium [diazepam]   Social History    Socioeconomic History   Marital status: Widowed    Spouse name: Not on file   Number of children: 2   Years of education: Not on file   Highest education level: Not on file  Occupational History   Occupation: retired  Tobacco Use   Smoking status:  Never   Smokeless tobacco: Never  Vaping Use   Vaping status: Never Used  Substance and Sexual Activity   Alcohol use: Yes    Alcohol/week: 7.0 standard drinks of alcohol    Types: 4 Cans of beer, 2 Glasses of wine, 1 Shots of liquor per week    Comment: almost ever day, mostly beer   Drug use: Never   Sexual activity: Yes  Other Topics Concern   Not on file  Social History Narrative   ** Merged History Encounter **       Resides in [place]   Social Determinants of Health   Financial Resource Strain: Not on file  Food Insecurity: Not on file  Transportation Needs: Not on file  Physical Activity: Not on file  Stress: Not on file  Social Connections: Not on file     Family History: The patient's family history includes Colon cancer in his paternal grandmother; Colon polyps in his paternal grandmother; Diabetes in his brother; Heart attack (age of onset: 23) in his father; Heart disease in his father; Lung disease in his mother. There is no history of Other, Esophageal cancer, Rectal cancer, or Stomach cancer.  ROS:   Please see the history of present illness.     All other systems reviewed and are negative.  EKGs/Labs/Other Studies Reviewed:    The following studies were reviewed today:   EKG:    Recent Labs: 07/16/2022: ALT 20  Recent Lipid Panel    Component Value Date/Time   CHOL 156 07/16/2022 1044   TRIG 83 07/16/2022 1044   HDL 75 07/16/2022 1044   CHOLHDL 2.1 07/16/2022 1044   LDLCALC 65 07/16/2022 1044     Risk Assessment/Calculations:                Physical Exam:    Physical Exam: Blood pressure 106/74, pulse 61, height 5' 10.5" (1.791 m), weight 169 lb 9.6 oz (76.9 kg), SpO2 97%.        GEN:  Well nourished, well developed in no acute distress HEENT: Normal NECK: No JVD; No carotid bruits LYMPHATICS: No lymphadenopathy CARDIAC: RRR , no murmurs, rubs, gallops RESPIRATORY:  Clear to auscultation without rales, wheezing or rhonchi  ABDOMEN: Soft, non-tender, non-distended MUSCULOSKELETAL:  No edema; No deformity  SKIN: Warm and dry NEUROLOGIC:  Alert and oriented x 3   ASSESSMENT:    No diagnosis found.  PLAN:      Hyperlipidemia:  has coronary art calcifications  His LDL goal is < 70 and his LDL is 65.  He wants to follow up with [place] in a year     Will have him follow up with Dr. [name]             Medication Adjustments/Labs and Tests Ordered: Current medicines are reviewed at length with the patient today.  Concerns regarding medicines are outlined above.  No orders of the defined types were placed in this encounter.  No orders of the defined types were placed in this encounter.   There are no Patient Instructions on file for this visit.   Signed, Kristeen Miss, MD  12/30/2022 3:06 PM    [facility]

## 2022-12-31 IMAGING — CT CT ABD-PELV W/ CM
2 of 5 series · 14 of 46 positions shown, 16 images · IV contrast (agent unspecified)
Comparison: 05/06/2019

CLINICAL DATA: Periumbilical pain.  Prior bili cul hernia repair.

EXAM:
CT ABDOMEN AND PELVIS WITH CONTRAST
TECHNIQUE: Multidetector CT imaging of the abdomen and pelvis was performed
using the standard protocol following bolus administration of
intravenous contrast.

[Series 2: abd pelvis 5.00 br40 s3 axial (person_name) · axial · 0.62mm/px · z∈[+1201,+1616]mm · 11 of 95 slices shown, 13 images]
[im 6/95  soft-tissue]
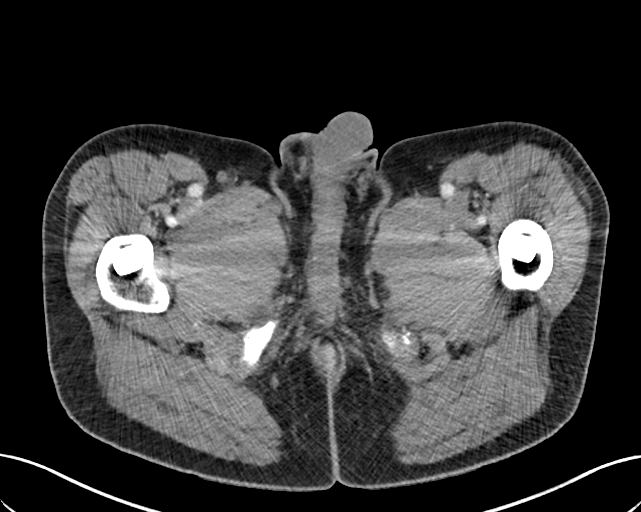
[im 6/95  bone]
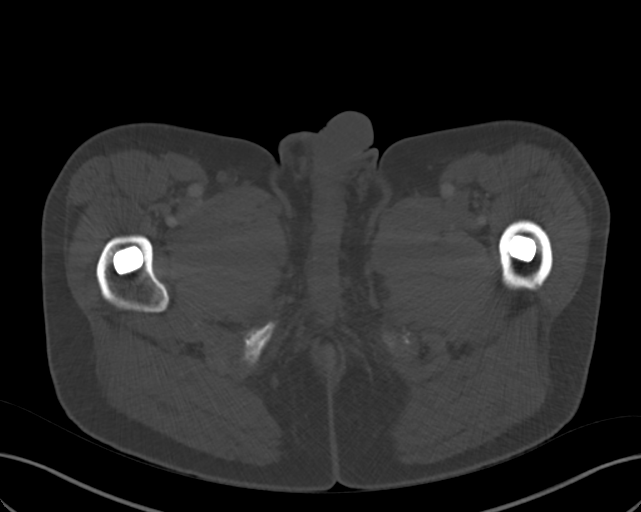
[im 16/95  soft-tissue]
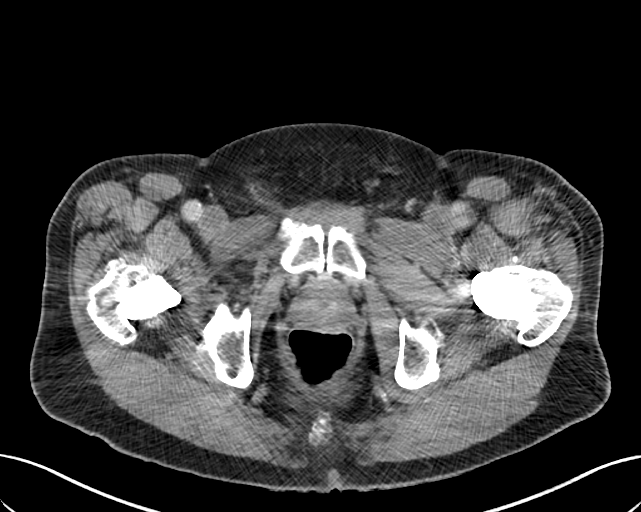
[im 21/95  soft-tissue]
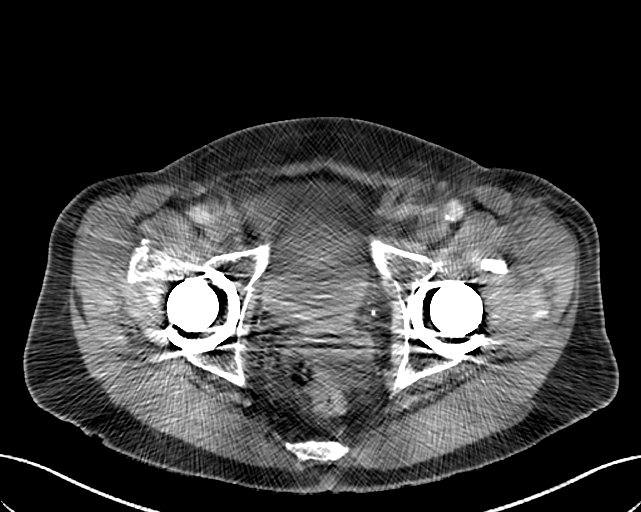
[im 32/95  soft-tissue]
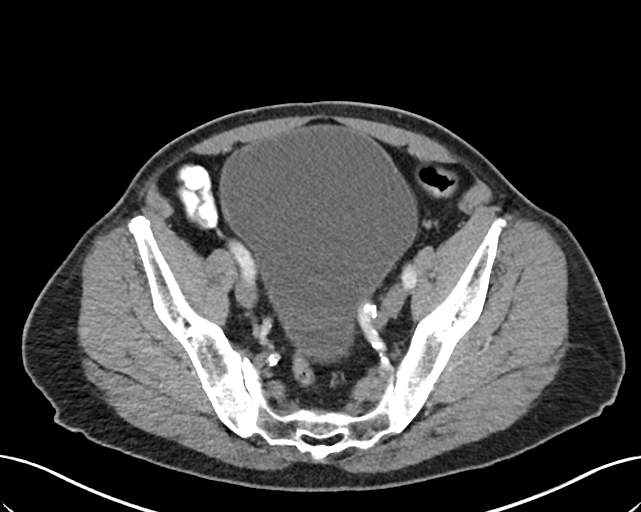
[im 37/95  soft-tissue]
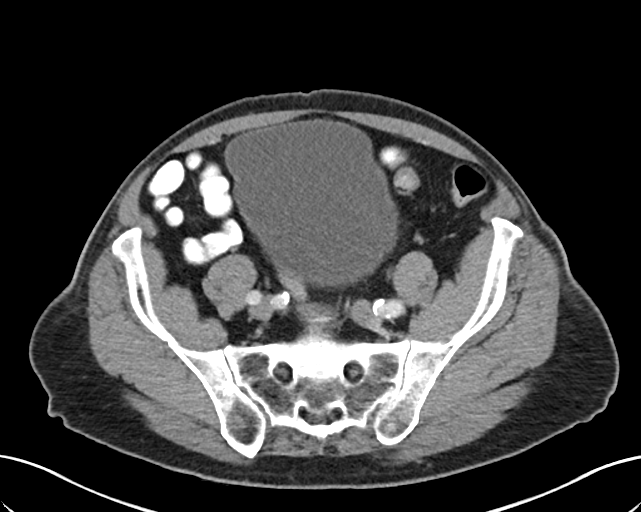
[im 48/95  soft-tissue]
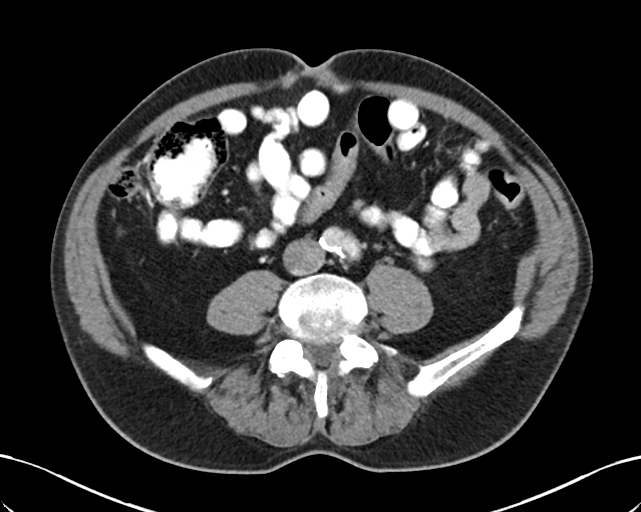
[im 58/95  soft-tissue]
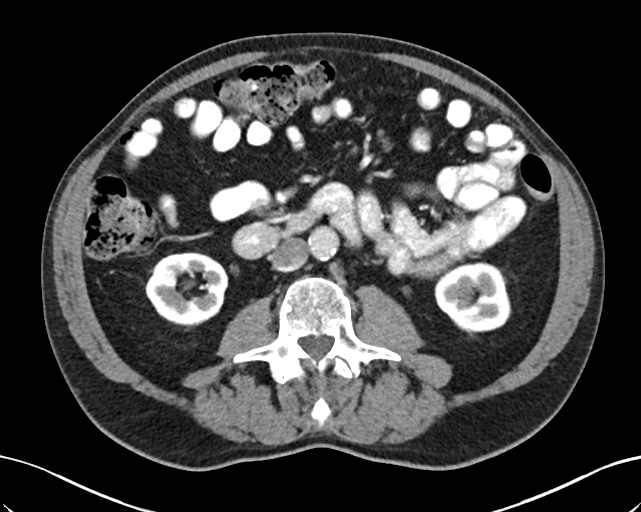
[im 63/95  soft-tissue]
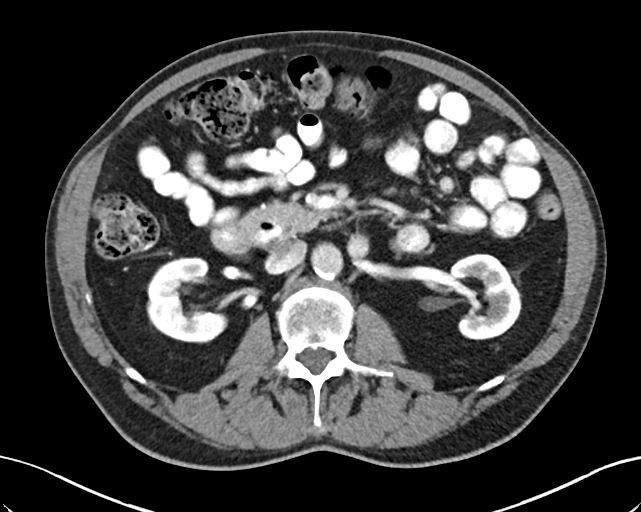
[im 74/95  soft-tissue]
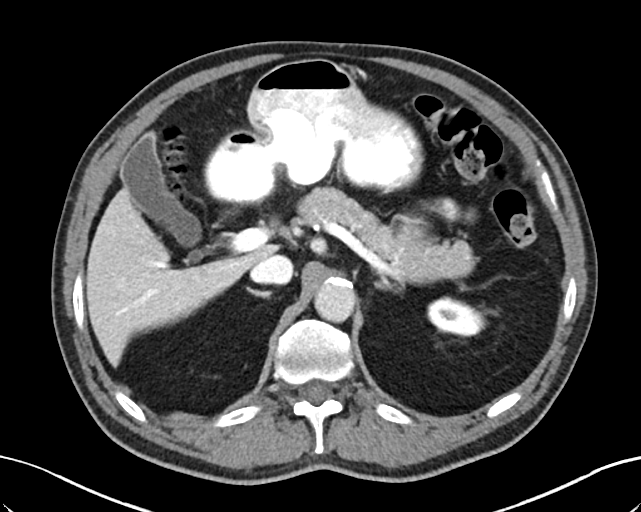
[im 74/95  bone]
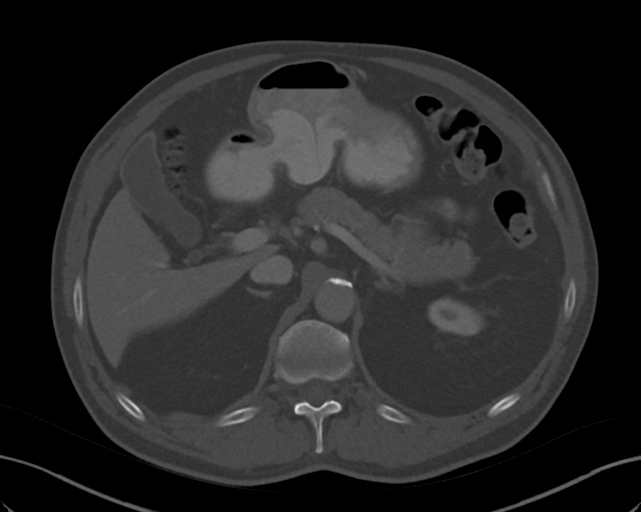
[im 79/95  soft-tissue]
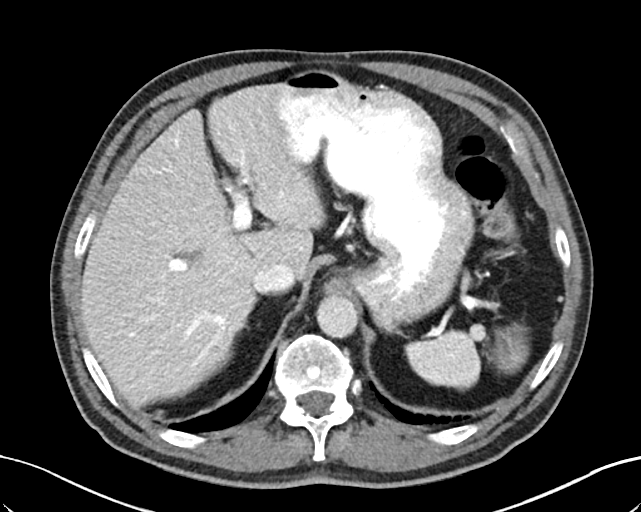
[im 89/95  soft-tissue]
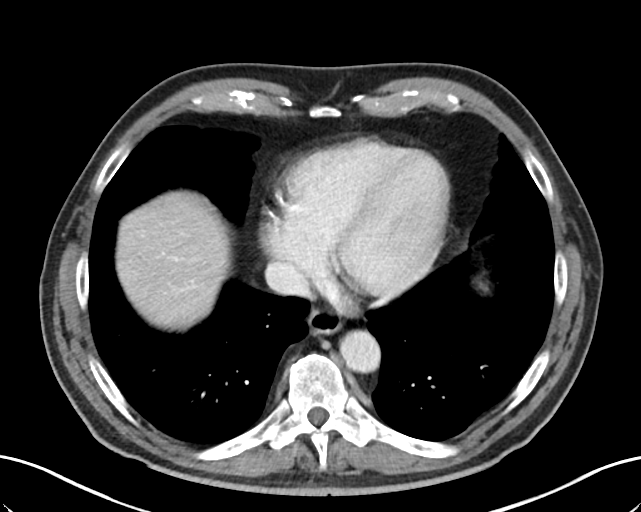

[Series 6: abd pelvis 2.00 br40 s3 (person_name) · coronal · 0.75mm/px · 3 of 152 slices shown]
[im 51/152  soft-tissue]
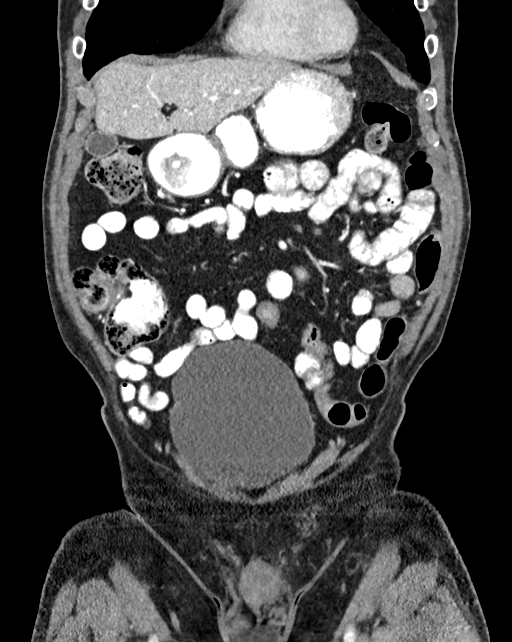
[im 68/152  soft-tissue]
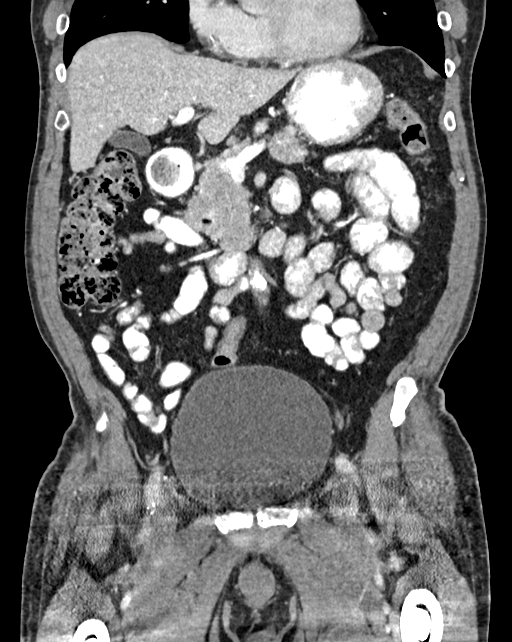
[im 84/152  soft-tissue]
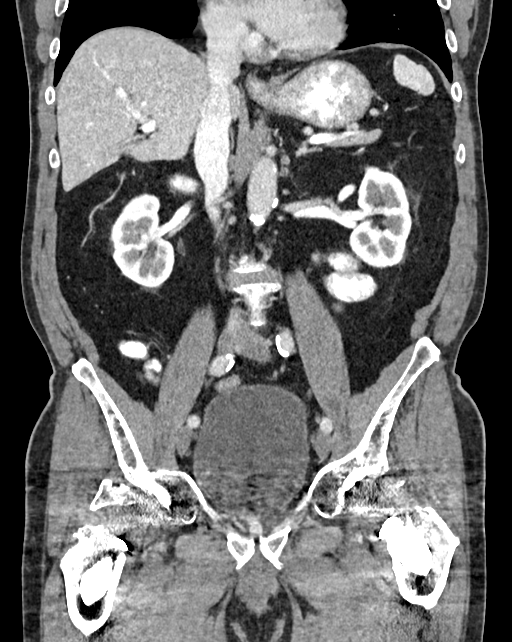

[14 of 46 positions shown; findings below may reference images not displayed]

RADIATION DOSE REDUCTION: This exam was performed according to the
departmental dose-optimization program which includes automated
exposure control, adjustment of the mA and/or kV according to
patient size and/or use of iterative reconstruction technique.

CONTRAST:  100mL G24TDV-OSS IOPAMIDOL (G24TDV-OSS) INJECTION 61%
FINDINGS: Lower chest: No acute abnormality.

Hepatobiliary: No focal hepatic abnormality. Gallbladder
unremarkable.

Pancreas: No focal abnormality or ductal dilatation.

Spleen: No focal abnormality.  Normal size.

Adrenals/Urinary Tract: No adrenal abnormality. No focal renal
abnormality. No stones or hydronephrosis. Urinary bladder is
unremarkable.

Stomach/Bowel: Normal appendix. Stomach, large and small bowel
grossly unremarkable.

Vascular/Lymphatic: Aortic atherosclerosis. No evidence of aneurysm
or adenopathy.

Reproductive: No visible focal abnormality.

Other: No free fluid or free air. No recurrent umbilical hernia. No
ventral wall hernia.

Musculoskeletal: Prior bilateral hip replacements. No acute bony
abnormality.
IMPRESSION: No acute findings in the abdomen or pelvis.

Aortic atherosclerosis.

## 2023-01-06 DIAGNOSIS — D044 Carcinoma in situ of skin of scalp and neck: Secondary | ICD-10-CM | POA: Diagnosis not present

## 2023-01-06 DIAGNOSIS — F4310 Post-traumatic stress disorder, unspecified: Secondary | ICD-10-CM | POA: Diagnosis not present

## 2023-01-06 DIAGNOSIS — L570 Actinic keratosis: Secondary | ICD-10-CM | POA: Diagnosis not present

## 2023-01-08 DIAGNOSIS — M25561 Pain in right knee: Secondary | ICD-10-CM | POA: Diagnosis not present

## 2023-01-10 ENCOUNTER — Other Ambulatory Visit: Payer: Self-pay | Admitting: Cardiovascular Disease

## 2023-01-10 DIAGNOSIS — Z79899 Other long term (current) drug therapy: Secondary | ICD-10-CM

## 2023-01-10 DIAGNOSIS — E782 Mixed hyperlipidemia: Secondary | ICD-10-CM

## 2023-01-13 DIAGNOSIS — F4310 Post-traumatic stress disorder, unspecified: Secondary | ICD-10-CM | POA: Diagnosis not present

## 2023-01-15 DIAGNOSIS — M25561 Pain in right knee: Secondary | ICD-10-CM | POA: Diagnosis not present

## 2023-01-20 DIAGNOSIS — F4310 Post-traumatic stress disorder, unspecified: Secondary | ICD-10-CM | POA: Diagnosis not present

## 2023-01-22 DIAGNOSIS — L570 Actinic keratosis: Secondary | ICD-10-CM | POA: Diagnosis not present

## 2023-01-22 DIAGNOSIS — C4442 Squamous cell carcinoma of skin of scalp and neck: Secondary | ICD-10-CM | POA: Diagnosis not present

## 2023-01-27 DIAGNOSIS — F4310 Post-traumatic stress disorder, unspecified: Secondary | ICD-10-CM | POA: Diagnosis not present

## 2023-01-29 DIAGNOSIS — M25561 Pain in right knee: Secondary | ICD-10-CM | POA: Diagnosis not present

## 2023-02-03 DIAGNOSIS — F4321 Adjustment disorder with depressed mood: Secondary | ICD-10-CM | POA: Diagnosis not present

## 2023-02-04 DIAGNOSIS — N132 Hydronephrosis with renal and ureteral calculous obstruction: Secondary | ICD-10-CM | POA: Diagnosis not present

## 2023-02-05 DIAGNOSIS — C4442 Squamous cell carcinoma of skin of scalp and neck: Secondary | ICD-10-CM | POA: Diagnosis not present

## 2023-02-09 DIAGNOSIS — N312 Flaccid neuropathic bladder, not elsewhere classified: Secondary | ICD-10-CM | POA: Diagnosis not present

## 2023-02-09 DIAGNOSIS — N521 Erectile dysfunction due to diseases classified elsewhere: Secondary | ICD-10-CM | POA: Diagnosis not present

## 2023-02-10 DIAGNOSIS — F4310 Post-traumatic stress disorder, unspecified: Secondary | ICD-10-CM | POA: Diagnosis not present

## 2023-02-17 DIAGNOSIS — F4310 Post-traumatic stress disorder, unspecified: Secondary | ICD-10-CM | POA: Diagnosis not present

## 2023-02-23 DIAGNOSIS — H903 Sensorineural hearing loss, bilateral: Secondary | ICD-10-CM | POA: Diagnosis not present

## 2023-02-23 DIAGNOSIS — H6123 Impacted cerumen, bilateral: Secondary | ICD-10-CM | POA: Diagnosis not present

## 2023-03-03 DIAGNOSIS — F4310 Post-traumatic stress disorder, unspecified: Secondary | ICD-10-CM | POA: Diagnosis not present

## 2023-03-06 DIAGNOSIS — M1711 Unilateral primary osteoarthritis, right knee: Secondary | ICD-10-CM | POA: Diagnosis not present

## 2023-03-10 DIAGNOSIS — F4310 Post-traumatic stress disorder, unspecified: Secondary | ICD-10-CM | POA: Diagnosis not present

## 2023-03-17 DIAGNOSIS — F4310 Post-traumatic stress disorder, unspecified: Secondary | ICD-10-CM | POA: Diagnosis not present

## 2023-03-24 DIAGNOSIS — F4312 Post-traumatic stress disorder, chronic: Secondary | ICD-10-CM | POA: Diagnosis not present

## 2023-03-31 DIAGNOSIS — F4312 Post-traumatic stress disorder, chronic: Secondary | ICD-10-CM | POA: Diagnosis not present

## 2023-04-02 DIAGNOSIS — H26493 Other secondary cataract, bilateral: Secondary | ICD-10-CM | POA: Diagnosis not present

## 2023-04-02 DIAGNOSIS — Z961 Presence of intraocular lens: Secondary | ICD-10-CM | POA: Diagnosis not present

## 2023-04-08 DIAGNOSIS — M25521 Pain in right elbow: Secondary | ICD-10-CM | POA: Diagnosis not present

## 2023-04-08 DIAGNOSIS — M19029 Primary osteoarthritis, unspecified elbow: Secondary | ICD-10-CM | POA: Diagnosis not present

## 2023-04-08 DIAGNOSIS — M79641 Pain in right hand: Secondary | ICD-10-CM | POA: Diagnosis not present

## 2023-04-08 DIAGNOSIS — M1811 Unilateral primary osteoarthritis of first carpometacarpal joint, right hand: Secondary | ICD-10-CM | POA: Diagnosis not present

## 2023-04-14 DIAGNOSIS — F4312 Post-traumatic stress disorder, chronic: Secondary | ICD-10-CM | POA: Diagnosis not present

## 2023-04-21 DIAGNOSIS — F4312 Post-traumatic stress disorder, chronic: Secondary | ICD-10-CM | POA: Diagnosis not present

## 2023-04-28 DIAGNOSIS — F4310 Post-traumatic stress disorder, unspecified: Secondary | ICD-10-CM | POA: Diagnosis not present

## 2023-05-05 DIAGNOSIS — F4310 Post-traumatic stress disorder, unspecified: Secondary | ICD-10-CM | POA: Diagnosis not present

## 2023-05-07 DIAGNOSIS — E7800 Pure hypercholesterolemia, unspecified: Secondary | ICD-10-CM | POA: Diagnosis not present

## 2023-05-07 DIAGNOSIS — E0590 Thyrotoxicosis, unspecified without thyrotoxic crisis or storm: Secondary | ICD-10-CM | POA: Diagnosis not present

## 2023-05-07 DIAGNOSIS — R7401 Elevation of levels of liver transaminase levels: Secondary | ICD-10-CM | POA: Diagnosis not present

## 2023-05-07 DIAGNOSIS — E559 Vitamin D deficiency, unspecified: Secondary | ICD-10-CM | POA: Diagnosis not present

## 2023-05-07 DIAGNOSIS — Z79899 Other long term (current) drug therapy: Secondary | ICD-10-CM | POA: Diagnosis not present

## 2023-05-11 DIAGNOSIS — E7800 Pure hypercholesterolemia, unspecified: Secondary | ICD-10-CM | POA: Diagnosis not present

## 2023-05-11 DIAGNOSIS — Z79899 Other long term (current) drug therapy: Secondary | ICD-10-CM | POA: Diagnosis not present

## 2023-05-11 DIAGNOSIS — M1711 Unilateral primary osteoarthritis, right knee: Secondary | ICD-10-CM | POA: Diagnosis not present

## 2023-05-11 DIAGNOSIS — Z0001 Encounter for general adult medical examination with abnormal findings: Secondary | ICD-10-CM | POA: Diagnosis not present

## 2023-05-11 DIAGNOSIS — F321 Major depressive disorder, single episode, moderate: Secondary | ICD-10-CM | POA: Diagnosis not present

## 2023-05-11 DIAGNOSIS — E038 Other specified hypothyroidism: Secondary | ICD-10-CM | POA: Diagnosis not present

## 2023-05-11 DIAGNOSIS — K219 Gastro-esophageal reflux disease without esophagitis: Secondary | ICD-10-CM | POA: Diagnosis not present

## 2023-05-11 DIAGNOSIS — I700 Atherosclerosis of aorta: Secondary | ICD-10-CM | POA: Diagnosis not present

## 2023-05-12 DIAGNOSIS — F4310 Post-traumatic stress disorder, unspecified: Secondary | ICD-10-CM | POA: Diagnosis not present

## 2023-05-19 DIAGNOSIS — F4310 Post-traumatic stress disorder, unspecified: Secondary | ICD-10-CM | POA: Diagnosis not present

## 2023-05-20 DIAGNOSIS — M79644 Pain in right finger(s): Secondary | ICD-10-CM | POA: Diagnosis not present

## 2023-05-21 DIAGNOSIS — H26493 Other secondary cataract, bilateral: Secondary | ICD-10-CM | POA: Diagnosis not present

## 2023-05-25 DIAGNOSIS — H6123 Impacted cerumen, bilateral: Secondary | ICD-10-CM | POA: Diagnosis not present

## 2023-06-09 DIAGNOSIS — F4310 Post-traumatic stress disorder, unspecified: Secondary | ICD-10-CM | POA: Diagnosis not present

## 2023-06-23 DIAGNOSIS — F4310 Post-traumatic stress disorder, unspecified: Secondary | ICD-10-CM | POA: Diagnosis not present

## 2023-06-25 DIAGNOSIS — Z961 Presence of intraocular lens: Secondary | ICD-10-CM | POA: Diagnosis not present

## 2023-06-25 DIAGNOSIS — H04123 Dry eye syndrome of bilateral lacrimal glands: Secondary | ICD-10-CM | POA: Diagnosis not present

## 2023-07-08 DIAGNOSIS — H903 Sensorineural hearing loss, bilateral: Secondary | ICD-10-CM | POA: Diagnosis not present

## 2023-07-14 DIAGNOSIS — F4310 Post-traumatic stress disorder, unspecified: Secondary | ICD-10-CM | POA: Diagnosis not present

## 2023-07-15 ENCOUNTER — Encounter: Payer: Self-pay | Admitting: Cardiovascular Disease

## 2023-07-15 DIAGNOSIS — F331 Major depressive disorder, recurrent, moderate: Secondary | ICD-10-CM | POA: Diagnosis not present

## 2023-07-15 DIAGNOSIS — E7800 Pure hypercholesterolemia, unspecified: Secondary | ICD-10-CM | POA: Diagnosis not present

## 2023-07-15 DIAGNOSIS — Z01818 Encounter for other preprocedural examination: Secondary | ICD-10-CM | POA: Diagnosis not present

## 2023-07-16 NOTE — H&P (Signed)
TOTAL KNEE ADMISSION H&P  Patient is being admitted for right total knee arthroplasty.  Subjective:  Chief Complaint: Right knee pain.  HPI: Tanner [name], 78 y.o. male has a history of pain and functional disability in the right knee due to arthritis and has failed non-surgical conservative treatments for greater than 12 weeks to include NSAID's and/or analgesics, corticosteriod injections, viscosupplementation injections, and activity modification. Onset of symptoms was gradual, starting several years ago with gradually worsening course since that time. The patient noted prior procedures on the knee to include  arthroscopy and menisectomy on the right knee.  Patient currently rates pain in the right knee at 8 out of 10 with activity. Patient has night pain, worsening of pain with activity and weight bearing, and pain that interferes with activities of daily living. Patient has evidence of  slight medial joint space narrowing and significant patellofemoral narrowing, almost bone-on-bone  by imaging studies. There is no active infection.  Patient Active Problem List   Diagnosis Date Noted   Hyperlipidemia 04/01/2022   Gynecomastia 07/15/2017   S/P shoulder replacement, left 05/01/2016   Abdominal pain 10/28/2013   Avascular necrosis of hip (HCC) 05/11/2013   OA (osteoarthritis) of hip 05/11/2013   OSA (obstructive sleep apnea) 10/17/2011    Past Medical History:  Diagnosis Date   ADD (attention deficit disorder)    Allergy    environmental   Anxiety    Arm thromboembolism, superficial, acute    from IV site, left arm   Arthritis    "thumbs; right knee" (05/01/2016)   Arthritis    Atypical mole 01/27/2007   right shoulder slight to moderate   Basal cell carcinoma 12/16/1995   right upper back dr Alma Friendly   [facility] (basal cell carcinoma of skin) 04/11/1996   left chest dr Alma Friendly   [facility] (basal cell carcinoma of skin) 12/05/2004   post lower left ear mohs   BCC (basal cell carcinoma  of skin) 07/14/2012   left post ear tx with bx   BPH (benign prostatic hypertrophy)    Cataract    removed bilat   Depression    Esophageal reflux    Gynecomastia    High cholesterol    IBS (irritable bowel syndrome)    Rosacea    SCC (squamous cell carcinoma) 02/19/2011   right post scalp cx3 cautery   SCC (squamous cell carcinoma) 07/08/2011   middle of scalp tx with bx   SCC (squamous cell carcinoma) 09/19/2013   scalp cx3 80fu   SCC (squamous cell carcinoma) 07/20/2016   top scalp cx3 59fu   SCC (squamous cell carcinoma) 07/20/2016   right side of neck   SCC (squamous cell carcinoma) 08/18/2017   behind left ear tx with bx   SCC (squamous cell carcinoma) 01/25/2018   left post scalp cx3 68fu   SCC (squamous cell carcinoma) 01/25/2018   right side scalp cx3 67fu   SCC (squamous cell carcinoma) 01/25/2018   mid post scalp cx3 88fu   SCC (squamous cell carcinoma) 02/22/2019   left front scalp cx3 32fu   SCC (squamous cell carcinoma) 03/17/2019   right front scalp tx with bx   SCC (squamous cell carcinoma) 04/21/2019   top of scalp cx3 3fu   Sleep apnea    central sleep apnea, no cpap   Squamous cell carcinoma of skin 02/19/2011   post scalp cx3 cautery with bx    Past Surgical History:  Procedure Laterality Date   BASAL CELL CARCINOMA EXCISION  left ear; right shoulder; top of head"   BUNIONECTOMY Left    CARPAL TUNNEL RELEASE Right    CATARACT EXTRACTION W/ INTRAOCULAR LENS  IMPLANT, BILATERAL Bilateral    CLAVICLE SURGERY Left 1980s   "cut an inch or so off it"   CLOSED REDUCTION HAND FRACTURE Left ~ 1995   "later took the screws out w/pliers"   COLONOSCOPY  [id number]   Dr [name]   ELBOW SURGERY Right    "had a tear; sewed it back onto the bone"   HERNIA REPAIR     INGUINAL HERNIA REPAIR Left 2001   JOINT REPLACEMENT     KNEE ARTHROSCOPY Right    MOUTH SURGERY     teeth removal/implants placed   NEUROMA SURGERY Left    foot   PLANTAR FASCIA RELEASE Right     SHOULDER ARTHROSCOPY W/ ROTATOR CUFF REPAIR Right    TONSILLECTOMY     TOTAL HIP ARTHROPLASTY Right 05/11/2013   Procedure: RIGHT TOTAL HIP ARTHROPLASTY ANTERIOR APPROACH;  Surgeon: Loanne Drilling, MD;  Location: [facility];  Service: Orthopedics;  Laterality: Right;  WOUND CLASSIFICATION-CLEAN   TOTAL HIP ARTHROPLASTY Left 09/18/2014   Procedure: LEFT TOTAL HIP ARTHROPLASTY ANTERIOR APPROACH;  Surgeon: Ollen Gross, MD;  Location: [facility];  Service: Orthopedics;  Laterality: Left;   TOTAL SHOULDER ARTHROPLASTY Left 05/01/2016   TOTAL SHOULDER ARTHROPLASTY Left 05/01/2016   Procedure: TOTAL SHOULDER ARTHROPLASTY;  Surgeon: Francena Hanly, MD;  Location: [facility];  Service: Orthopedics;  Laterality: Left;   UMBILICAL HERNIA REPAIR  03/01/2013    Prior to Admission medications   Medication Sig Start Date End Date Taking? Authorizing Provider  amoxicillin (AMOXIL) 500 MG tablet Take 500 mg by mouth 3 (three) times daily. 12/29/22   [provider]  amphetamine-dextroamphetamine (ADDERALL XR) 20 MG 24 hr capsule Take 20 mg by mouth 2 (two) times daily. Take 1 in the morning & one in the evening.    [provider]  aspirin 81 MG tablet Take 81 mg by mouth at bedtime.     [provider]  buPROPion (WELLBUTRIN SR) 150 MG 12 hr tablet Take 150 mg by mouth daily.    [provider]  Carboxymethylcellulose Sodium (REFRESH TEARS OP) Apply 1 drop to eye 3 (three) times daily as needed (dry eyes).    [provider]  clindamycin-benzoyl peroxide (BENZACLIN) gel clindamycin 1 %-benzoyl peroxide 5 % topical gel  APPLY TOPICALLY TO AFFECTED AREAS ONCE DAILY. MAY CAUSE DRYNESS.    [provider]  Desvenlafaxine Succinate (PRISTIQ PO) Take by mouth.    [provider]  Dextromethorphan-Bupropion (AUVELITY PO) Take 1 tablet by mouth daily.    [provider]  finasteride (PROSCAR) 5 MG tablet Take 5 mg by mouth daily.    [provider]   ibuprofen (ADVIL,MOTRIN) 200 MG tablet Take 200-400 mg by mouth every 6 (six) hours as needed for headache or moderate pain.     [provider]  Ivermectin 1 % CREA Apply topically. 01/19/20   [provider]  Multiple Vitamin (MULTIVITAMIN) capsule Take by mouth.    [provider]  omeprazole (PRILOSEC) 40 MG capsule Take by mouth. 04/16/16   [provider]  rosuvastatin (CRESTOR) 20 MG tablet TAKE 1 TABLET BY MOUTH EVERY DAY 01/13/23   Nahser, Deloris Ping, MD  sildenafil (REVATIO) 20 MG tablet Take 20-100 mg by mouth as needed (ED).     [provider]    Allergies  Allergen  Reactions   Valium [Diazepam] Anxiety    Becomes irrational and irritable    Social History   Socioeconomic History   Marital status: Widowed    Spouse name: Not on file   Number of children: 2   Years of education: Not on file   Highest education level: Not on file  Occupational History   Occupation: retired  Tobacco Use   Smoking status: Never   Smokeless tobacco: Never  Vaping Use   Vaping status: Never Used  Substance and Sexual Activity   Alcohol use: Yes    Alcohol/week: 7.0 standard drinks of alcohol    Types: 4 Cans of beer, 2 Glasses of wine, 1 Shots of liquor per week    Comment: almost ever day, mostly beer   Drug use: Never   Sexual activity: Yes  Other Topics Concern   Not on file  Social History Narrative   ** Merged History Encounter **       Resides in [place]   Social Drivers of Health   Financial Resource Strain: Not on file  Food Insecurity: Low Risk  (05/25/2023)   Received from [facility]   Hunger Vital Sign    Worried About Running Out of Food in the Last Year: Never true    Ran Out of Food in the Last Year: Never true  Transportation Needs: No Transportation Needs (05/25/2023)   Received from [facility]    In the past 12 months, has lack of reliable transportation kept you from medical  appointments, meetings, work or from getting things needed for daily living? : No  Physical Activity: Not on file  Stress: Not on file  Social Connections: Not on file  Intimate Partner Violence: Not on file    Tobacco Use: Low Risk  (04/08/2023)   Received from [facility]   Patient History    Smoking Tobacco Use: Never    Smokeless Tobacco Use: Never    Passive Exposure: Not on file   Social History   Substance and Sexual Activity  Alcohol Use Yes   Alcohol/week: 7.0 standard drinks of alcohol   Types: 4 Cans of beer, 2 Glasses of wine, 1 Shots of liquor per week   Comment: almost ever day, mostly beer    Family History  Problem Relation Age of Onset   Heart attack Father 42   Heart disease Father    Lung disease Mother    Colon cancer Paternal Grandmother        age 69-70's   Colon polyps Paternal Grandmother    Diabetes Brother    Other Neg Hx        gynecomastia   Esophageal cancer Neg Hx    Rectal cancer Neg Hx    Stomach cancer Neg Hx     ROS  Objective:  Physical Exam: Well nourished and well developed.  General: Alert and oriented x3, cooperative and pleasant, no acute distress.  Head: normocephalic, atraumatic, neck supple.  Eyes: EOMI.  Abdomen: non-tender to palpation and soft, normoactive bowel sounds. Musculoskeletal: - Evaluation of the right knee shows no effusion. - Range of motion: 0-130 degrees. - Very tender on the medial joint line. - No lateral joint line tenderness or instability noted. - Slightly antalgic gait pattern on the right. Calves soft and nontender. Motor function intact in LE. Strength 5/5 LE bilaterally. Neuro: Distal pulses 2+. Sensation to light touch intact in LE.  Vital signs in last 24 hours: BP: ()/()  Arterial Line BP: ()/()   Imaging Review Plain radiographs demonstrate moderate degenerative joint disease of the right knee. The overall alignment is neutral. The bone quality appears to be adequate for age and  reported activity level.  Assessment/Plan:  End stage arthritis, right knee   The patient history, physical examination, clinical judgment of the provider and imaging studies are consistent with end stage degenerative joint disease of the right knee and total knee arthroplasty is deemed medically necessary. The treatment options including medical management, injection therapy arthroscopy and arthroplasty were discussed at length. The risks and benefits of total knee arthroplasty were presented and reviewed. The risks due to aseptic loosening, infection, stiffness, patella tracking problems, thromboembolic complications and other imponderables were discussed. The patient acknowledged the explanation, agreed to proceed with the plan and consent was signed. Patient is being admitted for inpatient treatment for surgery, pain control, PT, OT, prophylactic antibiotics, VTE prophylaxis, progressive ambulation and ADLs and discharge planning. The patient is planning to be discharged  home .   Patient's anticipated LOS is less than 2 midnights, meeting these requirements: - Lives within 1 hour of care - Has a competent adult at home to recover with post-op - NO history of  - Chronic pain requiring opiods  - Diabetes  - Coronary Artery Disease  - Heart failure  - Heart attack  - Stroke  - Cardiac arrhythmia  - Respiratory Failure/COPD  - Renal failure  - Anemia  - Advanced Liver disease  Therapy Plans: EO Disposition: Home with Friend Planned DVT Prophylaxis: Xarelto 10 mg (hx DVT) DME Needed: None PCP: Dibas Koirala, MD (contacting for clearance) TXA: Topical Allergies: valium (aggression) Anesthesia Concerns: prefers general anesthesia BMI: 24.4 Last HgbA1c: not diabetic  Pharmacy: [facility] ([address]  Other: -Given clearance form to take to PCP -Requesting general anesthesia -Would like SDD -Hx of DVT 4-5 years ago  - Patient was instructed on what medications to stop prior to  surgery. - Follow-up visit in 2 weeks with Dr. Lequita Halt - Begin physical therapy following surgery - Pre-operative lab work as pre-surgical testing - Prescriptions will be provided in hospital at time of discharge  R. Arcola Jansky, PA-C Orthopedic Surgery [facility] [place]

## 2023-07-21 DIAGNOSIS — F4310 Post-traumatic stress disorder, unspecified: Secondary | ICD-10-CM | POA: Diagnosis not present

## 2023-07-23 DIAGNOSIS — M25561 Pain in right knee: Secondary | ICD-10-CM | POA: Diagnosis not present

## 2023-07-23 DIAGNOSIS — M6281 Muscle weakness (generalized): Secondary | ICD-10-CM | POA: Diagnosis not present

## 2023-07-24 NOTE — Patient Instructions (Signed)
SURGICAL WAITING ROOM VISITATION  Patients having surgery or a procedure may have no more than 2 support people in the waiting area - these visitors may rotate.    Children under the age of 75 must have an adult with them who is not the patient.  Due to an increase in RSV and influenza rates and associated hospitalizations, children ages 54 and under may not visit patients in [facility] hospitals.  Visitors with respiratory illnesses are discouraged from visiting and should remain at home.  If the patient needs to stay at the hospital during part of their recovery, the visitor guidelines for inpatient rooms apply. Pre-op nurse will coordinate an appropriate time for 1 support person to accompany patient in pre-op.  This support person may not rotate.    Please refer to the [facility] website for the visitor guidelines for Inpatients (after your surgery is over and you are in a regular room).       Your procedure is scheduled on:  08/10/2023    Report to [facility] Main Entrance    Report to admitting at   [phone]   Call this number if you have problems the morning of surgery [phone]   Do not eat food :After Midnight.   After Midnight you may have the following liquids until _ 0415_____ AM DAY OF SURGERY  Water Non-Citrus Juices (without pulp, NO RED-Apple, White grape, White cranberry) Black Coffee (NO MILK/CREAM OR CREAMERS, sugar ok)  Clear Tea (NO MILK/CREAM OR CREAMERS, sugar ok) regular and decaf                             Plain Jell-O (NO RED)                                           Fruit ices (not with fruit pulp, NO RED)                                     Popsicles (NO RED)                                                               Sports drinks like Gatorade (NO RED)                    The day of surgery:  Drink ONE (1) Pre-Surgery Clear Ensure or G2 at  0415AM  ( have completed by ) the morning of surgery. Drink in one sitting. Do not sip.   This drink was given to you during your hospital  pre-op appointment visit. Nothing else to drink after completing the  Pre-Surgery Clear Ensure or G2.          If you have questions, please contact your surgeon's office.   FOLLOW BOWEL PREP AND ANY ADDITIONAL PRE OP INSTRUCTIONS YOU RECEIVED FROM YOUR SURGEON'S OFFICE!!!     Oral Hygiene is also important to reduce your risk of infection.  Remember - BRUSH YOUR TEETH THE MORNING OF SURGERY WITH YOUR REGULAR TOOTHPASTE  DENTURES WILL BE REMOVED PRIOR TO SURGERY PLEASE DO NOT APPLY "Poly grip" OR ADHESIVES!!!   Do NOT smoke after Midnight   Stop all vitamins and herbal supplements 7 days before surgery.   Take these medicines the morning of surgery with A SIP OF WATER:   DO NOT TAKE ANY ORAL DIABETIC MEDICATIONS DAY OF YOUR SURGERY  Bring CPAP mask and tubing day of surgery.                              You may not have any metal on your body including hair pins, jewelry, and body piercing             Do not wear make-up, lotions, powders, perfumes/cologne, or deodorant  Do not wear nail polish including gel and S&S, artificial/acrylic nails, or any other type of covering on natural nails including finger and toenails. If you have artificial nails, gel coating, etc. that needs to be removed by a nail salon please have this removed prior to surgery or surgery may need to be canceled/ delayed if the surgeon/ anesthesia feels like they are unable to be safely monitored.   Do not shave  48 hours prior to surgery.               Men may shave face and neck.   Do not bring valuables to the hospital. [facility] IS NOT             RESPONSIBLE   FOR VALUABLES.   Contacts, glasses, dentures or bridgework may not be worn into surgery.   Bring small overnight bag day of surgery.   DO NOT BRING YOUR HOME MEDICATIONS TO THE HOSPITAL. PHARMACY WILL DISPENSE MEDICATIONS LISTED ON YOUR MEDICATION LIST TO  YOU DURING YOUR ADMISSION IN THE HOSPITAL!    Patients discharged on the day of surgery will not be allowed to drive home.  Someone NEEDS to stay with you for the first 24 hours after anesthesia.   Special Instructions: Bring a copy of your healthcare power of attorney and living will documents the day of surgery if you haven't scanned them before.              Please read over the following fact sheets you were given: IF YOU HAVE QUESTIONS ABOUT YOUR PRE-OP INSTRUCTIONS PLEASE CALL [phone]   If you received a COVID test during your pre-op visit  it is requested that you wear a mask when out in public, stay away from anyone that may not be feeling well and notify your surgeon if you develop symptoms. If you test positive for Covid or have been in contact with anyone that has tested positive in the last 10 days please notify you surgeon.      Pre-operative 5 CHG Bath Instructions   You can play a key role in reducing the risk of infection after surgery. Your skin needs to be as free of germs as possible. You can reduce the number of germs on your skin by washing with CHG (chlorhexidine gluconate) soap before surgery. CHG is an antiseptic soap that kills germs and continues to kill germs even after washing.   DO NOT use if you have an allergy to chlorhexidine/CHG or antibacterial soaps. If your skin becomes reddened or irritated, stop using the CHG and notify one of our RNs at [phone].  Please shower with the CHG soap starting 4 days before surgery using the following schedule:     Please keep in mind the following:  DO NOT shave, including legs and underarms, starting the day of your first shower.   You may shave your face at any point before/day of surgery.  Place clean sheets on your bed the day you start using CHG soap. Use a clean washcloth (not used since being washed) for each shower. DO NOT sleep with pets once you start using the CHG.   CHG Shower Instructions:  If  you choose to wash your hair and private area, wash first with your normal shampoo/soap.  After you use shampoo/soap, rinse your hair and body thoroughly to remove shampoo/soap residue.  Turn the water OFF and apply about 3 tablespoons (45 ml) of CHG soap to a CLEAN washcloth.  Apply CHG soap ONLY FROM YOUR NECK DOWN TO YOUR TOES (washing for 3-5 minutes)  DO NOT use CHG soap on face, private areas, open wounds, or sores.  Pay special attention to the area where your surgery is being performed.  If you are having back surgery, having someone wash your back for you may be helpful. Wait 2 minutes after CHG soap is applied, then you may rinse off the CHG soap.  Pat dry with a clean towel  Put on clean clothes/pajamas   If you choose to wear lotion, please use ONLY the CHG-compatible lotions on the back of this paper.     Additional instructions for the day of surgery: DO NOT APPLY any lotions, deodorants, cologne, or perfumes.   Put on clean/comfortable clothes.  Brush your teeth.  Ask your nurse before applying any prescription medications to the skin.      CHG Compatible Lotions   Aveeno Moisturizing lotion  Cetaphil Moisturizing Cream  Cetaphil Moisturizing Lotion  Clairol Herbal Essence Moisturizing Lotion, Dry Skin  Clairol Herbal Essence Moisturizing Lotion, Extra Dry Skin  Clairol Herbal Essence Moisturizing Lotion, Normal Skin  Curel Age Defying Therapeutic Moisturizing Lotion with Alpha Hydroxy  Curel Extreme Care Body Lotion  Curel Soothing Hands Moisturizing Hand Lotion  Curel Therapeutic Moisturizing Cream, Fragrance-Free  Curel Therapeutic Moisturizing Lotion, Fragrance-Free  Curel Therapeutic Moisturizing Lotion, Original Formula  Eucerin Daily Replenishing Lotion  Eucerin Dry Skin Therapy Plus Alpha Hydroxy Crme  Eucerin Dry Skin Therapy Plus Alpha Hydroxy Lotion  Eucerin Original Crme  Eucerin Original Lotion  Eucerin Plus Crme Eucerin Plus Lotion  Eucerin  TriLipid Replenishing Lotion  Keri Anti-Bacterial Hand Lotion  Keri Deep Conditioning Original Lotion Dry Skin Formula Softly Scented  Keri Deep Conditioning Original Lotion, Fragrance Free Sensitive Skin Formula  Keri Lotion Fast Absorbing Fragrance Free Sensitive Skin Formula  Keri Lotion Fast Absorbing Softly Scented Dry Skin Formula  Keri Original Lotion  Keri Skin Renewal Lotion Keri Silky Smooth Lotion  Keri Silky Smooth Sensitive Skin Lotion  Nivea Body Creamy Conditioning Oil  Nivea Body Extra Enriched Teacher, adult education Moisturizing Lotion Nivea Crme  Nivea Skin Firming Lotion  NutraDerm 30 Skin Lotion  NutraDerm Skin Lotion  NutraDerm Therapeutic Skin Cream  NutraDerm Therapeutic Skin Lotion  ProShield Protective Hand Cream  Provon moisturizing lotion

## 2023-07-24 NOTE — Progress Notes (Addendum)
 Anesthesia Review:  PCP: Dibas Koirala  Clearance in Media dated 07/24/23.  Cardiologist : Elease Hashimoto- lov 12/30/22   PPM/ ICD: Device Orders: Rep Notified:  Chest x-ray : EKG : 07/28/23  Echo : Stress test: Cardiac Cath :  CT Card- 04/13/22   Activity level: can do a flight of stairs withoiut difficulty  Sleep Study/ CPAP : sleep apnea no cpap  Fasting Blood Sugar :      / Checks Blood Sugar -- times a day:    Blood Thinner/ Instructions /Last Dose: ASA / Instructions/ Last Dose :    81 mg aspirin  LVMM at home and cell on 07/28/23 at 1008 am  LVMM at home and cell on 07/28/23 at 1020am.     Pt WAS 34 MINUTES LATE FOR PREOP APPT.   Med hx and preop instructons completed.

## 2023-07-28 ENCOUNTER — Encounter (HOSPITAL_COMMUNITY): Payer: Self-pay

## 2023-07-28 ENCOUNTER — Encounter (HOSPITAL_COMMUNITY)
Admission: RE | Admit: 2023-07-28 | Discharge: 2023-07-28 | Disposition: A | Discharge status: Home / Self Care | Payer: Medicare PPO | Source: Ambulatory Visit | Attending: Orthopedic Surgery

## 2023-07-28 ENCOUNTER — Other Ambulatory Visit: Payer: Self-pay

## 2023-07-28 VITALS — BP 116/69 | HR 52 | Temp 98.8°F | Resp 16 | Ht 70.0 in | Wt 168.0 lb

## 2023-07-28 DIAGNOSIS — R001 Bradycardia, unspecified: Secondary | ICD-10-CM | POA: Insufficient documentation

## 2023-07-28 DIAGNOSIS — Z01818 Encounter for other preprocedural examination: Secondary | ICD-10-CM | POA: Insufficient documentation

## 2023-07-28 DIAGNOSIS — M1711 Unilateral primary osteoarthritis, right knee: Secondary | ICD-10-CM | POA: Insufficient documentation

## 2023-07-28 DIAGNOSIS — E7800 Pure hypercholesterolemia, unspecified: Secondary | ICD-10-CM | POA: Insufficient documentation

## 2023-07-28 DIAGNOSIS — G4730 Sleep apnea, unspecified: Secondary | ICD-10-CM | POA: Insufficient documentation

## 2023-07-28 DIAGNOSIS — F4310 Post-traumatic stress disorder, unspecified: Secondary | ICD-10-CM | POA: Diagnosis not present

## 2023-07-28 HISTORY — DX: Cardiac murmur, unspecified: R01.1

## 2023-07-28 LAB — CBC
HCT: 46.4 % (ref 39.0–52.0)
Hemoglobin: 15.4 g/dL (ref 13.0–17.0)
MCH: 32.8 pg (ref 26.0–34.0)
MCHC: 33.2 g/dL (ref 30.0–36.0)
MCV: 98.7 fL (ref 80.0–100.0)
Platelets: 200 10*3/uL (ref 150–400)
RBC: 4.7 MIL/uL (ref 4.22–5.81)
RDW: 13.1 % (ref 11.5–15.5)
WBC: 5.6 10*3/uL (ref 4.0–10.5)
nRBC: 0 % (ref 0.0–0.2)

## 2023-07-28 LAB — BASIC METABOLIC PANEL
Anion gap: 10 (ref 5–15)
BUN: 21 mg/dL (ref 8–23)
CO2: 25 mmol/L (ref 22–32)
Calcium: 9.4 mg/dL (ref 8.9–10.3)
Chloride: 106 mmol/L (ref 98–111)
Creatinine, Ser: 1.15 mg/dL (ref 0.61–1.24)
GFR, Estimated: >60 mL/min (ref >60)
Glucose, Bld: 90 mg/dL (ref 70–99)
Potassium: 4.2 mmol/L (ref 3.5–5.1)
Sodium: 141 mmol/L (ref 135–145)

## 2023-07-28 LAB — SURGICAL PCR SCREEN
MRSA, PCR: NEGATIVE
Staphylococcus aureus: NEGATIVE

## 2023-07-29 NOTE — Progress Notes (Signed)
 Anesthesia Chart Review   Case: [id number] Date/Time: 08/10/23 0700   Procedure: TOTAL KNEE ARTHROPLASTY (Right: Knee)   Anesthesia type: Choice   Pre-op diagnosis: right knee osteoarthritis   Location: [facility] 09 / [facility]   Surgeons: Ollen Gross, MD       DISCUSSION:78 y.o. never smoker with h/o sleep apnea, right knee OA scheduled for above procedure 08/10/23 with Dr. Ollen Gross.   Clearance from PCP received which states pt is low risk for planned surgery.   He has followed with cardiology previously for management of HDL. No h/o cardiac interventions.  VS: BP 116/69   Pulse (!) 52   Temp 37.1 C (Oral)   Resp 16   Ht 5\' 10"  (1.778 m)   Wt 76.2 kg   SpO2 99%   BMI 24.11 kg/m   PROVIDERS: Koirala, Dibas, MD is PCP    LABS: Labs reviewed: Acceptable for surgery. (all labs ordered are listed, but only abnormal results are displayed)  Labs Reviewed  SURGICAL PCR SCREEN  BASIC METABOLIC PANEL  CBC     IMAGES:   EKG:   CV:  Past Medical History:  Diagnosis Date   ADD (attention deficit disorder)    Allergy    environmental   Anxiety    Arm thromboembolism, superficial, acute    from IV site, left arm   Arthritis    "thumbs; right knee" (05/01/2016)   Arthritis    Atypical mole 01/27/2007   right shoulder slight to moderate   Basal cell carcinoma 12/16/1995   right upper back dr Alma Friendly   [facility] (basal cell carcinoma of skin) 04/11/1996   left chest dr Alma Friendly   [facility] (basal cell carcinoma of skin) 12/05/2004   post lower left ear mohs   BCC (basal cell carcinoma of skin) 07/14/2012   left post ear tx with bx   BPH (benign prostatic hypertrophy)    Cataract    removed bilat   Depression    Esophageal reflux    Gynecomastia    Heart murmur    High cholesterol    IBS (irritable bowel syndrome)    Rosacea    SCC (squamous cell carcinoma) 02/19/2011   right post scalp cx3 cautery   SCC (squamous cell carcinoma) 07/08/2011   middle of scalp tx  with bx   SCC (squamous cell carcinoma) 09/19/2013   scalp cx3 71fu   SCC (squamous cell carcinoma) 07/20/2016   top scalp cx3 65fu   SCC (squamous cell carcinoma) 07/20/2016   right side of neck   SCC (squamous cell carcinoma) 08/18/2017   behind left ear tx with bx   SCC (squamous cell carcinoma) 01/25/2018   left post scalp cx3 51fu   SCC (squamous cell carcinoma) 01/25/2018   right side scalp cx3 1fu   SCC (squamous cell carcinoma) 01/25/2018   mid post scalp cx3 42fu   SCC (squamous cell carcinoma) 02/22/2019   left front scalp cx3 19fu   SCC (squamous cell carcinoma) 03/17/2019   right front scalp tx with bx   SCC (squamous cell carcinoma) 04/21/2019   top of scalp cx3 35fu   Sleep apnea    central sleep apnea, no cpap   Squamous cell carcinoma of skin 02/19/2011   post scalp cx3 cautery with bx    Past Surgical History:  Procedure Laterality Date   BASAL CELL CARCINOMA EXCISION     left ear; right shoulder; top of head"   BUNIONECTOMY Left    CARPAL  TUNNEL RELEASE Right    CATARACT EXTRACTION W/ INTRAOCULAR LENS  IMPLANT, BILATERAL Bilateral    CLAVICLE SURGERY Left 1980s   "cut an inch or so off it"   CLOSED REDUCTION HAND FRACTURE Left ~ 1995   "later took the screws out w/pliers"   COLONOSCOPY  [id number]   Dr [name]   ELBOW SURGERY Right    "had a tear; sewed it back onto the bone"   HERNIA REPAIR     INGUINAL HERNIA REPAIR Left 2001   JOINT REPLACEMENT     KNEE ARTHROSCOPY Right    MOUTH SURGERY     teeth removal/implants placed   NEUROMA SURGERY Left    foot   PLANTAR FASCIA RELEASE Right    SHOULDER ARTHROSCOPY W/ ROTATOR CUFF REPAIR Right    TONSILLECTOMY     TOTAL HIP ARTHROPLASTY Right 05/11/2013   Procedure: RIGHT TOTAL HIP ARTHROPLASTY ANTERIOR APPROACH;  Surgeon: Loanne Drilling, MD;  Location: [facility];  Service: Orthopedics;  Laterality: Right;  WOUND CLASSIFICATION-CLEAN   TOTAL HIP ARTHROPLASTY Left 09/18/2014   Procedure: LEFT TOTAL HIP ARTHROPLASTY  ANTERIOR APPROACH;  Surgeon: Ollen Gross, MD;  Location: [facility];  Service: Orthopedics;  Laterality: Left;   TOTAL SHOULDER ARTHROPLASTY Left 05/01/2016   TOTAL SHOULDER ARTHROPLASTY Left 05/01/2016   Procedure: TOTAL SHOULDER ARTHROPLASTY;  Surgeon: Francena Hanly, MD;  Location: [facility];  Service: Orthopedics;  Laterality: Left;   UMBILICAL HERNIA REPAIR  03/01/2013    MEDICATIONS:  amphetamine-dextroamphetamine (ADDERALL XR) 20 MG 24 hr capsule   aspirin 81 MG tablet   buPROPion (WELLBUTRIN XL) 150 MG 24 hr tablet   Carboxymethylcellulose Sodium (REFRESH TEARS OP)   cholecalciferol (VITAMIN D3) 25 MCG (1000 UNIT) tablet   clindamycin (CLEOCIN T) 1 % lotion   clindamycin-benzoyl peroxide (BENZACLIN) gel   clotrimazole-betamethasone (LOTRISONE) cream   desvenlafaxine (PRISTIQ) 25 MG 24 hr tablet   finasteride (PROSCAR) 5 MG tablet   ibuprofen (ADVIL,MOTRIN) 200 MG tablet   ipratropium (ATROVENT) 0.03 % nasal spray   Ivermectin 1 % CREA   ketoconazole (NIZORAL) 2 % cream   Multiple Vitamin (MULTIVITAMIN) capsule   omeprazole (PRILOSEC) 40 MG capsule   rosuvastatin (CRESTOR) 20 MG tablet   sildenafil (REVATIO) 20 MG tablet   No current facility-administered medications for this encounter.   Jodell Cipro Ward, PA-C [facility] [phone]

## 2023-07-30 DIAGNOSIS — D485 Neoplasm of uncertain behavior of skin: Secondary | ICD-10-CM | POA: Diagnosis not present

## 2023-07-30 DIAGNOSIS — L308 Other specified dermatitis: Secondary | ICD-10-CM | POA: Diagnosis not present

## 2023-07-30 DIAGNOSIS — C4442 Squamous cell carcinoma of skin of scalp and neck: Secondary | ICD-10-CM | POA: Diagnosis not present

## 2023-07-30 DIAGNOSIS — L219 Seborrheic dermatitis, unspecified: Secondary | ICD-10-CM | POA: Diagnosis not present

## 2023-07-30 DIAGNOSIS — L570 Actinic keratosis: Secondary | ICD-10-CM | POA: Diagnosis not present

## 2023-07-30 DIAGNOSIS — D225 Melanocytic nevi of trunk: Secondary | ICD-10-CM | POA: Diagnosis not present

## 2023-08-05 DIAGNOSIS — M1811 Unilateral primary osteoarthritis of first carpometacarpal joint, right hand: Secondary | ICD-10-CM | POA: Diagnosis not present

## 2023-08-08 NOTE — Anesthesia Preprocedure Evaluation (Signed)
 Anesthesia Evaluation    Reviewed: Allergy & Precautions, H&P , Patient's Chart, lab work & pertinent test results  Airway Mallampati: II  TM Distance: >3 FB Neck ROM: Full    Dental  (+) Teeth Intact, Dental Advisory Given   Pulmonary neg pulmonary ROS, sleep apnea    breath sounds clear to auscultation       Cardiovascular Exercise Tolerance: Good negative cardio ROS  Rhythm:Regular Rate:Normal     Neuro/Psych   Anxiety Depression    negative neurological ROS  negative psych ROS   GI/Hepatic negative GI ROS, Neg liver ROS,GERD  Medicated,,  Endo/Other  negative endocrine ROS    Renal/GU negative Renal ROS  negative genitourinary   Musculoskeletal  (+) Arthritis , Osteoarthritis,    Abdominal   Peds  (+) ATTENTION DEFICIT DISORDER WITHOUT HYPERACTIVITY Hematology negative hematology ROS (+)   Anesthesia Other Findings   Reproductive/Obstetrics negative OB ROS                             Anesthesia Physical Anesthesia Plan  ASA: 2  Anesthesia Plan: General   Post-op Pain Management: Regional block* and Ofirmev IV (intra-op)*   Induction: Intravenous  PONV Risk Score and Plan: 2 and Propofol infusion, Ondansetron and Dexamethasone  Airway Management Planned: LMA  Additional Equipment:   Intra-op Plan:   Post-operative Plan: Extubation in OR  Informed Consent: I have reviewed the patients History and Physical, chart, labs and discussed the procedure including the risks, benefits and alternatives for the proposed anesthesia with the patient or authorized representative who has indicated his/her understanding and acceptance.     Dental advisory given  Plan Discussed with: CRNA  Anesthesia Plan Comments: (Pt refuses spinal.)       Anesthesia Quick Evaluation

## 2023-08-10 ENCOUNTER — Ambulatory Visit (HOSPITAL_COMMUNITY): Payer: Self-pay | Admitting: Anesthesiology

## 2023-08-10 ENCOUNTER — Ambulatory Visit (HOSPITAL_COMMUNITY)
Admission: RE | Admit: 2023-08-10 | Discharge: 2023-08-10 | Disposition: A | Discharge status: Home / Self Care | Payer: Medicare PPO | Source: Ambulatory Visit | Attending: Orthopedic Surgery | Admitting: Orthopedic Surgery

## 2023-08-10 ENCOUNTER — Encounter (HOSPITAL_COMMUNITY)
Admission: RE | Disposition: A | Discharge status: Home / Self Care | Payer: Self-pay | Source: Ambulatory Visit | Attending: Orthopedic Surgery

## 2023-08-10 ENCOUNTER — Other Ambulatory Visit: Payer: Self-pay

## 2023-08-10 ENCOUNTER — Ambulatory Visit (HOSPITAL_COMMUNITY): Payer: Self-pay | Admitting: Physician Assistant

## 2023-08-10 ENCOUNTER — Encounter (HOSPITAL_COMMUNITY): Payer: Self-pay | Admitting: Orthopedic Surgery

## 2023-08-10 DIAGNOSIS — Z01818 Encounter for other preprocedural examination: Secondary | ICD-10-CM

## 2023-08-10 DIAGNOSIS — F418 Other specified anxiety disorders: Secondary | ICD-10-CM | POA: Diagnosis not present

## 2023-08-10 DIAGNOSIS — F32A Depression, unspecified: Secondary | ICD-10-CM | POA: Diagnosis not present

## 2023-08-10 DIAGNOSIS — F419 Anxiety disorder, unspecified: Secondary | ICD-10-CM | POA: Insufficient documentation

## 2023-08-10 DIAGNOSIS — M1990 Unspecified osteoarthritis, unspecified site: Secondary | ICD-10-CM | POA: Diagnosis not present

## 2023-08-10 DIAGNOSIS — M1711 Unilateral primary osteoarthritis, right knee: Secondary | ICD-10-CM

## 2023-08-10 DIAGNOSIS — G4733 Obstructive sleep apnea (adult) (pediatric): Secondary | ICD-10-CM | POA: Insufficient documentation

## 2023-08-10 DIAGNOSIS — G8918 Other acute postprocedural pain: Secondary | ICD-10-CM | POA: Diagnosis not present

## 2023-08-10 DIAGNOSIS — F988 Other specified behavioral and emotional disorders with onset usually occurring in childhood and adolescence: Secondary | ICD-10-CM | POA: Insufficient documentation

## 2023-08-10 DIAGNOSIS — Z86718 Personal history of other venous thrombosis and embolism: Secondary | ICD-10-CM | POA: Diagnosis not present

## 2023-08-10 DIAGNOSIS — M179 Osteoarthritis of knee, unspecified: Secondary | ICD-10-CM

## 2023-08-10 DIAGNOSIS — K219 Gastro-esophageal reflux disease without esophagitis: Secondary | ICD-10-CM | POA: Insufficient documentation

## 2023-08-10 HISTORY — PX: TOTAL KNEE ARTHROPLASTY: SHX125

## 2023-08-10 SURGERY — ARTHROPLASTY, KNEE, TOTAL
Anesthesia: General | Site: Knee | Laterality: Right

## 2023-08-10 MED ORDER — PROPOFOL 500 MG/50ML IV EMUL
INTRAVENOUS | Status: DC | PRN
  Administered 2023-08-10: 50 ug/kg/min via INTRAVENOUS
  Administered 2023-08-10: 150 mg via INTRAVENOUS

## 2023-08-10 MED ORDER — ORAL CARE MOUTH RINSE: 15.0000 mL | Freq: Once | OROMUCOSAL | Status: AC

## 2023-08-10 MED ORDER — MIDAZOLAM HCL 5 MG/5ML IJ SOLN
INTRAMUSCULAR | Status: DC | PRN
Start: 2023-08-10 — End: 2023-08-10
  Administered 2023-08-10: 2 mg via INTRAVENOUS

## 2023-08-10 MED ORDER — GLYCOPYRROLATE 0.2 MG/ML IJ SOLN
INTRAMUSCULAR | Status: DC | PRN
  Administered 2023-08-10: .2 mg via INTRAVENOUS

## 2023-08-10 MED ORDER — CHLORHEXIDINE GLUCONATE 0.12 % MT SOLN
15.0000 mL | Freq: Once | OROMUCOSAL | Status: AC
  Administered 2023-08-10: 15 mL via OROMUCOSAL

## 2023-08-10 MED ORDER — PHENYLEPHRINE HCL-NACL 20-0.9 MG/250ML-% IV SOLN
INTRAVENOUS | Status: AC
  Filled 2023-08-10: qty 250

## 2023-08-10 MED ORDER — METHOCARBAMOL 500 MG PO TABS
ORAL_TABLET | ORAL | Status: AC
  Filled 2023-08-10: qty 1

## 2023-08-10 MED ORDER — METHOCARBAMOL 500 MG PO TABS
500.0000 mg | ORAL_TABLET | Freq: Four times a day (QID) | ORAL | Status: DC | PRN
  Administered 2023-08-10: 500 mg via ORAL

## 2023-08-10 MED ORDER — OXYCODONE HCL 5 MG PO TABS: 5.0000 mg | ORAL_TABLET | Freq: Four times a day (QID) | ORAL | 0 refills | Status: DC | PRN

## 2023-08-10 MED ORDER — POVIDONE-IODINE 10 % EX SWAB: 2.0000 | Freq: Once | CUTANEOUS | Status: AC

## 2023-08-10 MED ORDER — DEXAMETHASONE SODIUM PHOSPHATE 10 MG/ML IJ SOLN
INTRAMUSCULAR | Status: DC | PRN
  Administered 2023-08-10: 10 mg via INTRAVENOUS

## 2023-08-10 MED ORDER — ACETAMINOPHEN 500 MG PO TABS: 1000.0000 mg | ORAL_TABLET | Freq: Four times a day (QID) | ORAL | Status: DC

## 2023-08-10 MED ORDER — FENTANYL CITRATE PF 50 MCG/ML IJ SOSY: 25.0000 ug | PREFILLED_SYRINGE | INTRAMUSCULAR | Status: DC | PRN

## 2023-08-10 MED ORDER — FENTANYL CITRATE (PF) 100 MCG/2ML IJ SOLN
INTRAMUSCULAR | Status: DC | PRN
  Administered 2023-08-10 (×3): 50 ug via INTRAVENOUS

## 2023-08-10 MED ORDER — FENTANYL CITRATE (PF) 100 MCG/2ML IJ SOLN
INTRAMUSCULAR | Status: AC
  Filled 2023-08-10: qty 2

## 2023-08-10 MED ORDER — HYDROMORPHONE HCL 2 MG/ML IJ SOLN
INTRAMUSCULAR | Status: AC
Start: 2023-08-10 — End: ?
  Filled 2023-08-10: qty 1

## 2023-08-10 MED ORDER — BUPIVACAINE LIPOSOME 1.3 % IJ SUSP
INTRAMUSCULAR | Status: AC
Start: 2023-08-10 — End: ?
  Filled 2023-08-10: qty 20

## 2023-08-10 MED ORDER — SODIUM CHLORIDE 0.9 % IR SOLN
Status: DC | PRN
  Administered 2023-08-10: 1000 mL

## 2023-08-10 MED ORDER — METHOCARBAMOL 1000 MG/10ML IJ SOLN: 500.0000 mg | Freq: Four times a day (QID) | INTRAMUSCULAR | Status: DC | PRN

## 2023-08-10 MED ORDER — MIDAZOLAM HCL 2 MG/2ML IJ SOLN
INTRAMUSCULAR | Status: AC
  Filled 2023-08-10: qty 2

## 2023-08-10 MED ORDER — ONDANSETRON HCL 4 MG PO TABS: 4.0000 mg | ORAL_TABLET | Freq: Four times a day (QID) | ORAL | Status: DC | PRN

## 2023-08-10 MED ORDER — ACETAMINOPHEN 10 MG/ML IV SOLN
1000.0000 mg | Freq: Four times a day (QID) | INTRAVENOUS | Status: DC
  Administered 2023-08-10: 1000 mg via INTRAVENOUS
  Filled 2023-08-10: qty 100

## 2023-08-10 MED ORDER — HYDROMORPHONE HCL 1 MG/ML IJ SOLN
INTRAMUSCULAR | Status: DC | PRN
  Administered 2023-08-10: .8 mg via INTRAVENOUS
  Administered 2023-08-10: .2 mg via INTRAVENOUS
  Administered 2023-08-10: .4 mg via INTRAVENOUS

## 2023-08-10 MED ORDER — ONDANSETRON HCL 4 MG/2ML IJ SOLN: 4.0000 mg | Freq: Four times a day (QID) | INTRAMUSCULAR | Status: DC | PRN

## 2023-08-10 MED ORDER — SODIUM CHLORIDE (PF) 0.9 % IJ SOLN
INTRAMUSCULAR | Status: AC
  Filled 2023-08-10: qty 50

## 2023-08-10 MED ORDER — TRANEXAMIC ACID 1000 MG/10ML IV SOLN
2000.0000 mg | Freq: Once | INTRAVENOUS | Status: AC
  Filled 2023-08-10: qty 20

## 2023-08-10 MED ORDER — ONDANSETRON HCL 4 MG/2ML IJ SOLN
INTRAMUSCULAR | Status: DC | PRN
  Administered 2023-08-10: 4 mg via INTRAVENOUS

## 2023-08-10 MED ORDER — 0.9 % SODIUM CHLORIDE (POUR BTL) OPTIME
TOPICAL | Status: DC | PRN
  Administered 2023-08-10: 1000 mL

## 2023-08-10 MED ORDER — METHOCARBAMOL 500 MG PO TABS: 500.0000 mg | ORAL_TABLET | Freq: Four times a day (QID) | ORAL | 0 refills | Status: DC | PRN

## 2023-08-10 MED ORDER — CEFAZOLIN SODIUM-DEXTROSE 2-4 GM/100ML-% IV SOLN
2.0000 g | INTRAVENOUS | Status: AC
  Administered 2023-08-10: 2 g via INTRAVENOUS
  Filled 2023-08-10: qty 100

## 2023-08-10 MED ORDER — OXYCODONE HCL 5 MG PO TABS: 10.0000 mg | ORAL_TABLET | ORAL | Status: DC | PRN

## 2023-08-10 MED ORDER — METOCLOPRAMIDE HCL 5 MG/ML IJ SOLN: 5.0000 mg | Freq: Three times a day (TID) | INTRAMUSCULAR | Status: DC | PRN

## 2023-08-10 MED ORDER — STERILE WATER FOR IRRIGATION IR SOLN
Status: DC | PRN
Start: 2023-08-10 — End: 2023-08-10
  Administered 2023-08-10: 1000 mL

## 2023-08-10 MED ORDER — EPHEDRINE SULFATE-NACL 50-0.9 MG/10ML-% IV SOSY
PREFILLED_SYRINGE | INTRAVENOUS | Status: DC | PRN
  Administered 2023-08-10 (×2): 5 mg via INTRAVENOUS

## 2023-08-10 MED ORDER — LACTATED RINGERS IV SOLN: INTRAVENOUS | Status: DC

## 2023-08-10 MED ORDER — DEXMEDETOMIDINE HCL IN NACL 200 MCG/50ML IV SOLN
INTRAVENOUS | Status: DC | PRN
  Administered 2023-08-10: 6 ug via INTRAVENOUS

## 2023-08-10 MED ORDER — TRANEXAMIC ACID 1000 MG/10ML IV SOLN
INTRAVENOUS | Status: DC | PRN
  Administered 2023-08-10: 2000 mg via TOPICAL

## 2023-08-10 MED ORDER — METOCLOPRAMIDE HCL 5 MG PO TABS: 5.0000 mg | ORAL_TABLET | Freq: Three times a day (TID) | ORAL | Status: DC | PRN

## 2023-08-10 MED ORDER — DEXAMETHASONE SODIUM PHOSPHATE 10 MG/ML IJ SOLN: 8.0000 mg | Freq: Once | INTRAMUSCULAR | Status: AC

## 2023-08-10 MED ORDER — CEFAZOLIN SODIUM-DEXTROSE 2-4 GM/100ML-% IV SOLN: 2.0000 g | Freq: Four times a day (QID) | INTRAVENOUS | Status: DC

## 2023-08-10 MED ORDER — BUPIVACAINE-EPINEPHRINE (PF) 0.5% -1:200000 IJ SOLN
INTRAMUSCULAR | Status: DC | PRN
  Administered 2023-08-10: 20 mL via PERINEURAL

## 2023-08-10 MED ORDER — BUPIVACAINE LIPOSOME 1.3 % IJ SUSP: 20.0000 mL | Freq: Once | INTRAMUSCULAR | Status: AC

## 2023-08-10 MED ORDER — OXYCODONE HCL 5 MG PO TABS
ORAL_TABLET | ORAL | Status: AC
  Filled 2023-08-10: qty 1

## 2023-08-10 MED ORDER — PHENYLEPHRINE 80 MCG/ML (10ML) SYRINGE FOR IV PUSH (FOR BLOOD PRESSURE SUPPORT)
PREFILLED_SYRINGE | INTRAVENOUS | Status: DC | PRN
  Administered 2023-08-10: 120 ug via INTRAVENOUS

## 2023-08-10 MED ORDER — OXYCODONE HCL 5 MG PO TABS
5.0000 mg | ORAL_TABLET | ORAL | Status: DC | PRN
Start: 2023-08-10 — End: 2023-08-10
  Administered 2023-08-10: 5 mg via ORAL

## 2023-08-10 MED ORDER — SODIUM CHLORIDE (PF) 0.9 % IJ SOLN
INTRAMUSCULAR | Status: DC | PRN
  Administered 2023-08-10: 80 mL

## 2023-08-10 MED ORDER — RIVAROXABAN 10 MG PO TABS
10.0000 mg | ORAL_TABLET | Freq: Every day | ORAL | 0 refills | Status: AC
Start: 2023-08-11 — End: 2023-09-01

## 2023-08-10 MED ORDER — SODIUM CHLORIDE (PF) 0.9 % IJ SOLN
INTRAMUSCULAR | Status: AC
  Filled 2023-08-10: qty 10

## 2023-08-10 SURGICAL SUPPLY — 44 items
ATTUNE MED DOME PAT 41 KNEE (Knees) IMPLANT
ATTUNE PS FEM RT SZ 7 CEM KNEE (Femur) IMPLANT
ATTUNE PSRP INSR SZ7 6 KNEE (Insert) IMPLANT
BAG COUNTER SPONGE SURGICOUNT (BAG) IMPLANT
BAG ZIPLOCK 12X15 (MISCELLANEOUS) ×1 IMPLANT
BASE TIBIAL ROT PLAT SZ 7 KNEE (Knees) IMPLANT
BLADE SAG 18X100X1.27 (BLADE) ×1 IMPLANT
BLADE SAW SGTL 11.0X1.19X90.0M (BLADE) ×1 IMPLANT
BNDG ELASTIC 6INX 5YD STR LF (GAUZE/BANDAGES/DRESSINGS) ×1 IMPLANT
BOWL SMART MIX CTS (DISPOSABLE) ×1 IMPLANT
CEMENT HV SMART SET (Cement) ×2 IMPLANT
COVER SURGICAL LIGHT HANDLE (MISCELLANEOUS) ×1 IMPLANT
CUFF TRNQT CYL 34X4.125X (TOURNIQUET CUFF) ×1 IMPLANT
DERMABOND ADVANCED .7 DNX12 (GAUZE/BANDAGES/DRESSINGS) ×1 IMPLANT
DRAPE U-SHAPE 47X51 STRL (DRAPES) ×1 IMPLANT
DRSG AQUACEL AG ADV 3.5X10 (GAUZE/BANDAGES/DRESSINGS) ×1 IMPLANT
DURAPREP 26ML APPLICATOR (WOUND CARE) ×1 IMPLANT
ELECT REM PT RETURN 15FT ADLT (MISCELLANEOUS) ×1 IMPLANT
GLOVE BIO SURGEON STRL SZ 6.5 (GLOVE) IMPLANT
GLOVE BIO SURGEON STRL SZ7 (GLOVE) IMPLANT
GLOVE BIO SURGEON STRL SZ8 (GLOVE) ×1 IMPLANT
GLOVE BIOGEL PI IND STRL 7.0 (GLOVE) IMPLANT
GLOVE BIOGEL PI IND STRL 8 (GLOVE) ×1 IMPLANT
GOWN STRL REUS W/ TWL LRG LVL3 (GOWN DISPOSABLE) ×1 IMPLANT
HOLDER FOLEY CATH W/STRAP (MISCELLANEOUS) IMPLANT
IMMOBILIZER KNEE 20 (SOFTGOODS) ×1 IMPLANT
IMMOBILIZER KNEE 20 THIGH 36 (SOFTGOODS) ×1 IMPLANT
KIT TURNOVER KIT A (KITS) IMPLANT
MANIFOLD NEPTUNE II (INSTRUMENTS) ×1 IMPLANT
NS IRRIG 1000ML POUR BTL (IV SOLUTION) ×1 IMPLANT
PACK TOTAL KNEE CUSTOM (KITS) ×1 IMPLANT
PADDING CAST COTTON 6X4 STRL (CAST SUPPLIES) ×2 IMPLANT
PIN STEINMAN FIXATION KNEE (PIN) IMPLANT
PROTECTOR NERVE ULNAR (MISCELLANEOUS) ×1 IMPLANT
SET HNDPC FAN SPRY TIP SCT (DISPOSABLE) ×1 IMPLANT
SUT MNCRL AB 4-0 PS2 18 (SUTURE) ×1 IMPLANT
SUT STRATAFIX 0 PDS 27 VIOLET (SUTURE) ×1 IMPLANT
SUT VIC AB 2-0 CT1 TAPERPNT 27 (SUTURE) ×3 IMPLANT
SUTURE STRATFX 0 PDS 27 VIOLET (SUTURE) ×1 IMPLANT
TIBIAL BASE ROT PLAT SZ 7 KNEE (Knees) ×1 IMPLANT
TRAY FOLEY MTR SLVR 16FR STAT (SET/KITS/TRAYS/PACK) IMPLANT
TUBE SUCTION HIGH CAP CLEAR NV (SUCTIONS) ×1 IMPLANT
WATER STERILE IRR 1000ML POUR (IV SOLUTION) ×2 IMPLANT
WRAP KNEE MAXI GEL POST OP (GAUZE/BANDAGES/DRESSINGS) ×1 IMPLANT

## 2023-08-10 NOTE — Interval H&P Note (Signed)
 History and Physical Interval Note:  08/10/2023 6:27 AM  Tanner [name]  has presented today for surgery, with the diagnosis of right knee osteoarthritis.  The various methods of treatment have been discussed with the patient and family. After consideration of risks, benefits and other options for treatment, the patient has consented to  Procedure(s): ARTHROPLASTY, KNEE, TOTAL (Right) as a surgical intervention.  The patient's history has been reviewed, patient examined, no change in status, stable for surgery.  I have reviewed the patient's chart and labs.  Questions were answered to the patient's satisfaction.     Homero Fellers Celestine Prim

## 2023-08-10 NOTE — Anesthesia Postprocedure Evaluation (Signed)
 Anesthesia Post Note  Patient: Tanner [name]  Procedure(s) Performed: ARTHROPLASTY, KNEE, TOTAL (Right: Knee)     Patient location during evaluation: PACU Anesthesia Type: General and Regional Level of consciousness: awake and alert Pain management: pain level controlled Vital Signs Assessment: post-procedure vital signs reviewed and stable Respiratory status: spontaneous breathing, nonlabored ventilation and respiratory function stable Cardiovascular status: blood pressure returned to baseline and stable Postop Assessment: no apparent nausea or vomiting Anesthetic complications: no  No notable events documented.  Last Vitals:  Vitals:   08/10/23 0915 08/10/23 0930  BP: 137/72 121/64  Pulse: 66 (!) 56  Resp: 12 12  Temp:    SpO2: 100% 97%    Last Pain:  Vitals:   08/10/23 0930  TempSrc:   PainSc: Asleep                 Tasheka Houseman,W. EDMOND

## 2023-08-10 NOTE — Care Plan (Signed)
 Ortho Bundle Case Management Note  Patient Details  Name: Tanner [name] MRN: [id number] Date of Birth: 07/27/1945  RT TKA 08/10/23  DCP: Home with friend  DME: No needs, has RW  PT: EO 3/13                  DME Arranged:  N/A DME Agency:  NA  HH Arranged:  NA HH Agency:     Additional Comments: Please contact me with any questions of if this plan should need to change.   Aida Raider, Case Manager [facility] [phone]  08/10/2023, 8:49 AM

## 2023-08-10 NOTE — Anesthesia Procedure Notes (Signed)
 Anesthesia Regional Block: Adductor canal block   Pre-Anesthetic Checklist: , timeout performed,  Correct Patient, Correct Site, Correct Laterality,  Correct Procedure, Correct Position, site marked,  Risks and benefits discussed,  Pre-op evaluation,  At surgeon's request and post-op pain management  Laterality: Right  Prep: Maximum Sterile Barrier Precautions used, chloraprep       Needles:  Injection technique: Single-shot  Needle Type: Echogenic Stimulator Needle     Needle Length: 9cm  Needle Gauge: 21     Additional Needles:   Procedures:,,,, ultrasound used (permanent image in chart),,    Narrative:  Start time: 08/10/2023 6:54 AM End time: 08/10/2023 7:04 AM Injection made incrementally with aspirations every 5 mL.  Performed by: Personally  Anesthesiologist: Gaynelle Adu, MD

## 2023-08-10 NOTE — Evaluation (Signed)
 Physical Therapy Evaluation Patient Details Name: Tanner [name] MRN: [id number] DOB: March 09, 1946 Today's Date: 08/10/2023  History of Present Illness  Pt is 78 yo male admitted on 08/09/24 for R TKA.  Pt with hx including but not limited to HLD, L TSA, Bil THA, OSA, arthritis  Clinical Impression  Pt is s/p TKA resulting in the deficits listed below (see PT Problem List). At baseline, pt lives alone, active, and independent.  He has someone to stay with him at discharge and has a RW.  Pt with good pain control and quad activation. Knee ROM is stiff and limited but functional.  He was able to ambulate 51' with RW safely and performed stairs similar to home setup.  Pt did prefer crutches on stairs with no rails and will need a set as reports his are old at home and unsure if even correct size.  Pt lethargic toward end of session (had general anesthesia), but friend present and with good understanding of HEP.  Pt demonstrates safe gait & transfers in order to return home from PT perspective once discharged by MD.  While in hospital, will continue to benefit from PT for skilled therapy to advance mobility and exercises.           If plan is discharge home, recommend the following: A little help with walking and/or transfers;A little help with bathing/dressing/bathroom;Assistance with cooking/housework;Help with stairs or ramp for entrance   Can travel by private vehicle        Equipment Recommendations Crutches  Recommendations for Other Services       Functional Status Assessment Patient has had a recent decline in their functional status and demonstrates the ability to make significant improvements in function in a reasonable and predictable amount of time.     Precautions / Restrictions Precautions Precautions: Fall;Knee Required Braces or Orthoses: Knee Immobilizer - Right Knee Immobilizer - Right: Discontinue once straight leg raise with < 10 degree lag Restrictions Weight Bearing  Restrictions Per Provider Order: Yes RLE Weight Bearing Per Provider Order: Weight bearing as tolerated      Mobility  Bed Mobility Overal bed mobility: Needs Assistance Bed Mobility: Supine to Sit     Supine to sit: Supervision          Transfers Overall transfer level: Needs assistance Equipment used: Rolling walker (2 wheels), Crutches Transfers: Sit to/from Stand Sit to Stand: Contact guard assist           General transfer comment: STS x 3, CGA for safety; cues for hand placement    Ambulation/Gait Ambulation/Gait assistance: Contact guard assist Gait Distance (Feet): 80 Feet Assistive device: Rolling walker (2 wheels), Crutches Gait Pattern/deviations: Step-to pattern, Decreased stride length, Decreased weight shift to right Gait velocity: decreased but functional     General Gait Details: Educated on sequencing and RW use; steady gait with RW; Ambulating 68' with RW steadily; 10' with crutches to get to stairs. Educated to use RW for ambulation, crutches for stairs  Stairs Stairs: Yes Stairs assistance: Contact guard assist   Number of Stairs: 6 General stair comments: Started with R rail and L cane to simulate in home stairs and pt performed with CGA.  Tried HHA and cane to simulate outside - pt able to do but felt he would do better with crutches - has used crutches in past.  Tried 2 steps wtih crutches and CGA tolerated well.  Educated on sequencing for all and with good carry over.  Wheelchair Mobility  Tilt Bed    Modified Rankin (Stroke Patients Only)       Balance Overall balance assessment: Needs assistance Sitting-balance support: No upper extremity supported Sitting balance-Leahy Scale: Good     Standing balance support: No upper extremity supported, Bilateral upper extremity supported Standing balance-Leahy Scale: Fair Standing balance comment: RW or crutches to walk; able to stand without UE support                              Pertinent Vitals/Pain Pain Assessment Pain Assessment: 0-10 Pain Score: 1  Pain Location: R knee Pain Descriptors / Indicators: Discomfort Pain Intervention(s): Limited activity within patient's tolerance, Premedicated before session, Monitored during session    Home Living Family/patient expects to be discharged to:: Private residence Living Arrangements: Alone Available Help at Discharge: Friend(s);Available 24 hours/day Type of Home: House Home Access: Stairs to enter Entrance Stairs-Rails: None Entrance Stairs-Number of Steps: 3 Alternate Level Stairs-Number of Steps: 3 - Split level (kitchen/living main then bed and bath up) Home Layout: Multi-level Home Equipment: BSC/3in1;Rolling Walker (2 wheels);Cane - single point      Prior Function Prior Level of Function : Independent/Modified Independent;Driving                     Extremity/Trunk Assessment   Upper Extremity Assessment Upper Extremity Assessment: Overall WFL for tasks assessed    Lower Extremity Assessment Lower Extremity Assessment: LLE deficits/detail;RLE deficits/detail RLE Deficits / Details: Expected post op changes; ROM: reports feels stiff, knee ROM ~10 to 50 degrees; MMT: ankle 5/5, hip and knee 3/5 notfurther tested LLE Deficits / Details: ROM WFL; MMT 5/5    Cervical / Trunk Assessment Cervical / Trunk Assessment: Normal  Communication   Communication Factors Affecting Communication: Hearing impaired    Cognition Arousal: Alert Behavior During Therapy: WFL for tasks assessed/performed   PT - Cognitive impairments: No apparent impairments                       PT - Cognition Comments: Pt awake but feeling sleepy (had general anesthesia) - friend present for education         Cueing       General Comments  Educated on safe ice use, no pivots, car transfers, resting with leg straight, and TED hose during day. Also, encouraged walking every 1-2 hours during day.  Educated on HEP with focus on mobility the first weeks. Discussed doing exercises within pain control and if pain increasing could decreased ROM, reps, and stop exercises as needed. Encouraged to perform quad sets and ankle pumps frequently for blood flow and to promote full knee extension.     Exercises Total Joint Exercises Ankle Circles/Pumps: AROM, Both, 5 reps, Supine Quad Sets: AROM, Both, 5 reps, Supine (weak on R) Heel Slides: AAROM, Right, 5 reps, Seated Hip ABduction/ADduction: AAROM, Right, 5 reps, Seated Long Arc Quad: AAROM, Right, 5 reps, Seated Knee Flexion: AAROM, Right, 5 reps, Seated Goniometric ROM: R knee 10 to 50 Other Exercises Other Exercises: Educated on [employer] techniques as needed   Assessment/Plan    PT Assessment Patient needs continued PT services  PT Problem List Decreased strength;Pain;Decreased range of motion;Decreased activity tolerance;Decreased balance;Decreased mobility;Decreased knowledge of use of DME       PT Treatment Interventions DME instruction;Therapeutic exercise;Gait training;Balance training;Stair training;Functional mobility training;Therapeutic activities;Patient/family education;Modalities    PT Goals (Current goals can be found in the Care  Plan section)  Acute Rehab PT Goals Patient Stated Goal: return home PT Goal Formulation: With patient/family Time For Goal Achievement: 08/24/23 Potential to Achieve Goals: Good    Frequency 7X/week     Co-evaluation               AM-PAC PT "6 Clicks" Mobility  Outcome Measure Help needed turning from your back to your side while in a flat bed without using bedrails?: A Little Help needed moving from lying on your back to sitting on the side of a flat bed without using bedrails?: A Little Help needed moving to and from a bed to a chair (including a wheelchair)?: A Little Help needed standing up from a chair using your arms (e.g., wheelchair or bedside chair)?: A Little Help needed  to walk in hospital room?: A Little Help needed climbing 3-5 steps with a railing? : A Little 6 Click Score: 18    End of Session Equipment Utilized During Treatment: Gait belt Activity Tolerance: Patient tolerated treatment well Patient left: in chair;with family/visitor present (in PACU) Nurse Communication: Mobility status PT Visit Diagnosis: Other abnormalities of gait and mobility (R26.89);Muscle weakness (generalized) (M62.81)    Time: 4098-1191 PT Time Calculation (min) (ACUTE ONLY): 56 min   Charges:   PT Evaluation $PT Eval Low Complexity: 1 Low PT Treatments $Gait Training: 23-37 mins $Therapeutic Exercise: 8-22 mins PT General Charges $$ ACUTE PT VISIT: 1 Visit         Anise Salvo, PT Acute Rehab [facility] [phone]   Rayetta Humphrey 08/10/2023, 1:37 PM

## 2023-08-10 NOTE — Progress Notes (Signed)
 Orthopedic Tech Progress Note Patient Details:  Tanner [name] [birth year] [id number]  Ortho Devices Type of Ortho Device: Crutches Ortho Device/Splint Interventions: Ordered, Adjustment   Post Interventions Instructions Provided: Adjustment of device, Care of device Crutch training provided by PT. [name] 08/10/2023, 1:14 PM

## 2023-08-10 NOTE — Discharge Instructions (Addendum)
Tanner Gross, MD Total Joint Specialist [facility] [place] [address], [address] [place], [place] [address] [phone]  TOTAL KNEE REPLACEMENT POSTOPERATIVE DIRECTIONS    Knee Rehabilitation, Guidelines Following Surgery  Results after knee surgery are often greatly improved when you follow the exercise, range of motion and muscle strengthening exercises prescribed by your doctor. Safety measures are also important to protect the knee from further injury. If any of these exercises cause you to have increased pain or swelling in your knee joint, decrease the amount until you are comfortable again and slowly increase them. If you have problems or questions, call your caregiver or physical therapist for advice.   BLOOD CLOT PREVENTION Take a 10 mg Xarelto once a day for three weeks following surgery. Then take an 81 mg Aspirin once a day for three weeks. Then discontinue Aspirin. You may resume your vitamins/supplements once you have discontinued the Xarelto. Do not take any NSAIDs (Advil, Aleve, Ibuprofen, Meloxicam, etc.) until you have discontinued the Xarelto.   HOME CARE INSTRUCTIONS  Remove items at home which could result in a fall. This includes throw rugs or furniture in walking pathways.  ICE to the affected knee as much as tolerated. Icing helps control swelling. If the swelling is well controlled you will be more comfortable and rehab easier. Continue to use ice on the knee for pain and swelling from surgery. You may notice swelling that will progress down to the foot and ankle. This is normal after surgery. Elevate the leg when you are not up walking on it.    Continue to use the breathing machine which will help keep your temperature down. It is common for your temperature to cycle up and down following surgery, especially at night when you are not up moving around and exerting yourself. The breathing machine keeps your lungs expanded and your temperature  down. Do not place pillow under the operative knee, focus on keeping the knee straight while resting  DIET You may resume your previous home diet once you are discharged from the hospital.  DRESSING / WOUND CARE / SHOWERING Keep your bulky bandage on for 2 days. On the third post-operative day you may remove the Ace bandage and gauze. There is a waterproof adhesive bandage on your skin which will stay in place until your first follow-up appointment. Once you remove this you will not need to place another bandage You may begin showering 3 days following surgery, but do not submerge the incision under water.  ACTIVITY For the first 5 days, the key is rest and control of pain and swelling Do your home exercises twice a day starting on post-operative day 3. On the days you go to physical therapy, just do the home exercises once that day. You should rest, ice and elevate the leg for 50 minutes out of every hour. Get up and walk/stretch for 10 minutes per hour. After 5 days you can increase your activity slowly as tolerated. Walk with your walker as instructed. Use the walker until you are comfortable transitioning to a cane. Walk with the cane in the opposite hand of the operative leg. You may discontinue the cane once you are comfortable and walking steadily. Avoid periods of inactivity such as sitting longer than an hour when not asleep. This helps prevent blood clots.  You may discontinue the knee immobilizer once you are able to perform a straight leg raise while lying down. You may resume a sexual relationship in one month or  when given the OK by your doctor.  You may return to work once you are cleared by your doctor.  Do not drive a car for 6 weeks or until released by your surgeon.  Do not drive while taking narcotics.  TED HOSE STOCKINGS Wear the elastic stockings on both legs for three weeks following surgery during the day. You may remove them at night for sleeping.  WEIGHT  BEARING Weight bearing as tolerated with assist device (walker, cane, etc) as directed, use it as long as suggested by your surgeon or therapist, typically at least 4-6 weeks.  POSTOPERATIVE CONSTIPATION PROTOCOL Constipation - defined medically as fewer than three stools per week and severe constipation as less than one stool per week.  One of the most common issues patients have following surgery is constipation.  Even if you have a regular bowel pattern at home, your normal regimen is likely to be disrupted due to multiple reasons following surgery.  Combination of anesthesia, postoperative narcotics, change in appetite and fluid intake all can affect your bowels.  In order to avoid complications following surgery, here are some recommendations in order to help you during your recovery period.  Colace (docusate) - Pick up an over-the-counter form of Colace or another stool softener and take twice a day as long as you are requiring postoperative pain medications.  Take with a full glass of water daily.  If you experience loose stools or diarrhea, hold the colace until you stool forms back up. If your symptoms do not get better within 1 week or if they get worse, check with your doctor. Dulcolax (bisacodyl) - Pick up over-the-counter and take as directed by the product packaging as needed to assist with the movement of your bowels.  Take with a full glass of water.  Use this product as needed if not relieved by Colace only.  MiraLax (polyethylene glycol) - Pick up over-the-counter to have on hand. MiraLax is a solution that will increase the amount of water in your bowels to assist with bowel movements.  Take as directed and can mix with a glass of water, juice, soda, coffee, or tea. Take if you go more than two days without a movement. Do not use MiraLax more than once per day. Call your doctor if you are still constipated or irregular after using this medication for 7 days in a row.  If you continue  to have problems with postoperative constipation, please contact the office for further assistance and recommendations.  If you experience "the worst abdominal pain ever" or develop nausea or vomiting, please contact the office immediatly for further recommendations for treatment.  ITCHING If you experience itching with your medications, try taking only a single pain pill, or even half a pain pill at a time.  You can also use Benadryl over the counter for itching or also to help with sleep.   MEDICATIONS See your medication summary on the "After Visit Summary" that the nursing staff will review with you prior to discharge.  You may have some home medications which will be placed on hold until you complete the course of blood thinner medication.  It is important for you to complete the blood thinner medication as prescribed by your surgeon.  Continue your approved medications as instructed at time of discharge.  PRECAUTIONS If you experience chest pain or shortness of breath - call 911 immediately for transfer to the hospital emergency department.  If you develop a fever greater that 101 F, purulent  drainage from wound, increased redness or drainage from wound, foul odor from the wound/dressing, or calf pain - CONTACT YOUR SURGEON.                                                   FOLLOW-UP APPOINTMENTS Make sure you keep all of your appointments after your operation with your surgeon and caregivers. You should call the office at the above phone number and make an appointment for approximately two weeks after the date of your surgery or on the date instructed by your surgeon outlined in the "After Visit Summary".  RANGE OF MOTION AND STRENGTHENING EXERCISES  Rehabilitation of the knee is important following a knee injury or an operation. After just a few days of immobilization, the muscles of the thigh which control the knee become weakened and shrink (atrophy). Knee exercises are designed to build up  the tone and strength of the thigh muscles and to improve knee motion. Often times heat used for twenty to thirty minutes before working out will loosen up your tissues and help with improving the range of motion but do not use heat for the first two weeks following surgery. These exercises can be done on a training (exercise) mat, on the floor, on a table or on a bed. Use what ever works the best and is most comfortable for you Knee exercises include:  Leg Lifts - While your knee is still immobilized in a splint or cast, you can do straight leg raises. Lift the leg to 60 degrees, hold for 3 sec, and slowly lower the leg. Repeat 10-20 times 2-3 times daily. Perform this exercise against resistance later as your knee gets better.  Quad and Hamstring Sets - Tighten up the muscle on the front of the thigh (Quad) and hold for 5-10 sec. Repeat this 10-20 times hourly. Hamstring sets are done by pushing the foot backward against an object and holding for 5-10 sec. Repeat as with quad sets.  Leg Slides: Lying on your back, slowly slide your foot toward your buttocks, bending your knee up off the floor (only go as far as is comfortable). Then slowly slide your foot back down until your leg is flat on the floor again. Angel Wings: Lying on your back spread your legs to the side as far apart as you can without causing discomfort.  A rehabilitation program following serious knee injuries can speed recovery and prevent re-injury in the future due to weakened muscles. Contact your doctor or a physical therapist for more information on knee rehabilitation.   POST-OPERATIVE OPIOID TAPER INSTRUCTIONS: It is important to wean off of your opioid medication as soon as possible. If you do not need pain medication after your surgery it is ok to stop day one. Opioids include: Codeine, Hydrocodone(Norco, Vicodin), Oxycodone(Percocet, oxycontin) and hydromorphone amongst others.  Long term and even short term use of opiods can  cause: Increased pain response Dependence Constipation Depression Respiratory depression And more.  Withdrawal symptoms can include Flu like symptoms Nausea, vomiting And more Techniques to manage these symptoms Hydrate well Eat regular healthy meals Stay active Use relaxation techniques(deep breathing, meditating, yoga) Do Not substitute Alcohol to help with tapering If you have been on opioids for less than two weeks and do not have pain than it is ok to stop all together.  Plan to  wean off of opioids This plan should start within one week post op of your joint replacement. Maintain the same interval or time between taking each dose and first decrease the dose.  Cut the total daily intake of opioids by one tablet each day Next start to increase the time between doses. The last dose that should be eliminated is the evening dose.   IF YOU ARE TRANSFERRED TO A SKILLED REHAB FACILITY If the patient is transferred to a skilled rehab facility following release from the hospital, a list of the current medications will be sent to the facility for the patient to continue.  When discharged from the skilled rehab facility, please have the facility set up the patient's Home Health Physical Therapy prior to being released. Also, the skilled facility will be responsible for providing the patient with their medications at time of release from the facility to include their pain medication, the muscle relaxants, and their blood thinner medication. If the patient is still at the rehab facility at time of the two week follow up appointment, the skilled rehab facility will also need to assist the patient in arranging follow up appointment in our office and any transportation needs.  MAKE SURE YOU:  Understand these instructions.  Get help right away if you are not doing well or get worse.   DENTAL ANTIBIOTICS:  In most cases prophylactic antibiotics for Dental procdeures after total joint surgery are  not necessary.  Exceptions are as follows:  1. History of prior total joint infection  2. Severely immunocompromised (Organ Transplant, cancer chemotherapy, Rheumatoid biologic medications such as Humera)  3. Poorly controlled diabetes (A1C &gt; 8.0, blood glucose over 200)  If you have one of these conditions, contact your surgeon for an antibiotic prescription, prior to your dental procedure.    Pick up stool softner and laxative for home use following surgery while on pain medications. Do not submerge incision under water. Please use good hand washing techniques while changing dressing each day. May shower starting three days after surgery. Please use a clean towel to pat the incision dry following showers. Continue to use ice for pain and swelling after surgery. Do not use any lotions or creams on the incision until instructed by your surgeon.  ________________________________________________________________________  Information on my medicine - XARELTO (Rivaroxaban)  This medication education was reviewed with me or my healthcare representative as part of my discharge preparation.  The pharmacist that spoke with me during my hospital stay was:  Why was Xarelto prescribed for you? Xarelto was prescribed for you to reduce the risk of blood clots forming after orthopedic surgery. The medical term for these abnormal blood clots is venous thromboembolism (VTE).  What do you need to know about xarelto ? Take your Xarelto ONCE DAILY at the same time every day. You may take it either with or without food.  If you have difficulty swallowing the tablet whole, you may crush it and mix in applesauce just prior to taking your dose.  Take Xarelto exactly as prescribed by your doctor and DO NOT stop taking Xarelto without talking to the doctor who prescribed the medication.  Stopping without other VTE prevention medication to take the place of Xarelto may increase your risk of  developing a clot.  After discharge, you should have regular check-up appointments with your healthcare provider that is prescribing your Xarelto.    What do you do if you miss a dose? If you miss a dose, take it as soon  as you remember on the same day then continue your regularly scheduled once daily regimen the next day. Do not take two doses of Xarelto on the same day.   Important Safety Information A possible side effect of Xarelto is bleeding. You should call your healthcare provider right away if you experience any of the following: Bleeding from an injury or your nose that does not stop. Unusual colored urine (red or dark Games) or unusual colored stools (red or black). Unusual bruising for unknown reasons. A serious fall or if you hit your head (even if there is no bleeding).  Some medicines may interact with Xarelto and might increase your risk of bleeding while on Xarelto. To help avoid this, consult your healthcare provider or pharmacist prior to using any new prescription or non-prescription medications, including herbals, vitamins, non-steroidal anti-inflammatory drugs (NSAIDs) and supplements.  This website has more information on Xarelto: VisitDestination.com.br.

## 2023-08-10 NOTE — Progress Notes (Signed)
 Orthopedic Tech Progress Note Patient Details:  Tanner [name] Jun 11, 1945 [id number]  Patient ID: Keturah Barre, male   DOB: [birth year], 78 y.o.   MRN: [id number] Unable to place patient into CPM in recovery due to them being on a stretcher and not a hospital bed. Darleen Crocker 08/10/2023, 9:04 AM

## 2023-08-10 NOTE — Transfer of Care (Signed)
 Immediate Anesthesia Transfer of Care Note  Patient: Tanner [name]  Procedure(s) Performed: ARTHROPLASTY, KNEE, TOTAL (Right: Knee)  Patient Location: PACU  Anesthesia Type:General  Level of Consciousness: sedated  Airway & Oxygen Therapy: Patient Spontanous Breathing  Post-op Assessment: Report given to RN  Post vital signs: Reviewed and stable  Last Vitals:  Vitals Value Taken Time  BP 133/68 08/10/23 0845  Temp    Pulse 64 08/10/23 0849  Resp 9 08/10/23 0849  SpO2 100 % 08/10/23 0849  Vitals shown include unfiled device data.  Last Pain:  Vitals:   08/10/23 0609  TempSrc:   PainSc: 0-No pain         Complications: No notable events documented.

## 2023-08-10 NOTE — Op Note (Addendum)
 OPERATIVE REPORT-TOTAL KNEE ARTHROPLASTY   Pre-operative diagnosis- Osteoarthritis  Right knee(s)  Post-operative diagnosis- Osteoarthritis Right knee(s)  Procedure-  Right  Total Knee Arthroplasty  Surgeon- Gus Rankin. Nandan Willems, MD  Assistant- Weston Brass, PA-C   Anesthesia-  Adductor canal block and General  EBL- 25 ml   Drains None  Tourniquet time- 31 minutes @ 300 mm Hg  Complications- None  Condition-PACU - hemodynamically stable.   Brief Clinical Note  LESTON SCHUELLER is a 78 y.o. year old [name] with end stage OA of his right knee with progressively worsening pain and dysfunction. He has constant pain, with activity and at rest and significant functional deficits with difficulties even with ADLs. He has had extensive non-op management including analgesics, injections of cortisone and viscosupplements, and home exercise program, but remains in significant pain with significant dysfunction. Radiographs show bone on bone arthritis medial. He presents now for right Total Knee Arthroplasty.     Procedure in detail---   Tanner patient is brought into Tanner operating room and positioned supine on Tanner operating table. After successful administration of  Adductor canal block and General,   a tourniquet is placed high on Tanner  Right thigh(s) and Tanner lower extremity is prepped and draped in Tanner usual sterile fashion. Time out is performed by Tanner operating team and then Tanner  Right lower extremity is wrapped in Esmarch, knee flexed and Tanner tourniquet inflated to 300 mmHg.       A midline incision is made with a ten blade through Tanner subcutaneous tissue to Tanner level of Tanner extensor mechanism. A fresh blade is used to make a medial parapatellar arthrotomy. Soft tissue over Tanner proximal medial tibia is subperiosteally elevated to Tanner joint line with a knife and into Tanner semimembranosus bursa with a Cobb elevator. Soft tissue over Tanner proximal lateral tibia is elevated with attention being paid  to avoiding Tanner patellar tendon on Tanner tibial tubercle. Tanner patella is everted, knee flexed 90 degrees and Tanner ACL and PCL are removed. Findings are bone on bone medial with exposed bone lateral and patellofemoral        Tanner drill is used to create a starting hole in Tanner distal femur and Tanner canal is thoroughly irrigated with sterile saline to remove Tanner fatty contents. Tanner 5 degree Right  valgus alignment guide is placed into Tanner femoral canal and Tanner distal femoral cutting block is pinned to remove 9 mm off Tanner distal femur. Resection is made with an oscillating saw.      Tanner tibia is subluxed forward and Tanner menisci are removed. Tanner extramedullary alignment guide is placed referencing proximally at Tanner medial aspect of Tanner tibial tubercle and distally along Tanner second metatarsal axis and tibial crest. Tanner block is pinned to remove 2mm off Tanner more deficient medial  side. Resection is made with an oscillating saw. Size 7is Tanner most appropriate size for Tanner tibia and Tanner proximal tibia is prepared with Tanner modular drill and keel punch for that size.      Tanner femoral sizing guide is placed and size 7 is most appropriate. Rotation is marked off Tanner epicondylar axis and confirmed by creating a rectangular flexion gap at 90 degrees. Tanner size 7 cutting block is pinned in this rotation and Tanner anterior, posterior and chamfer cuts are made with Tanner oscillating saw. Tanner intercondylar block is then placed and that cut is made.      Trial size 7 tibial component, trial size 7 posterior  stabilized femur and a 6  mm posterior stabilized rotating platform insert trial is placed. Full extension is achieved with excellent varus/valgus and anterior/posterior balance throughout full range of motion. Tanner patella is everted and thickness measured to be 27  mm. Free hand resection is taken to 15 mm, a 41 template is placed, lug holes are drilled, trial patella is placed, and it tracks normally. Osteophytes are removed off Tanner  posterior femur with Tanner trial in place. All trials are removed and Tanner cut bone surfaces prepared with pulsatile lavage. Cement is mixed and once ready for implantation, Tanner size 7 tibial implant, size  7 posterior stabilized femoral component, and Tanner size 41 patella are cemented in place and Tanner patella is held with Tanner clamp. Tanner trial insert is placed and Tanner knee held in full extension. Tanner Exparel (20 ml mixed with 60 ml saline) is injected into Tanner extensor mechanism, posterior capsule, medial and lateral gutters and subcutaneous tissues.  All extruded cement is removed and once Tanner cement is hard Tanner permanent 6 mm posterior stabilized rotating platform insert is placed into Tanner tibial tray.      Tanner wound is copiously irrigated with saline solution and Tanner extensor mechanism closed with # 0 Stratofix suture. Tanner tourniquet is released for a total tourniquet time of 31  minutes. Flexion against gravity is 140 degrees and Tanner patella tracks normally. Subcutaneous tissue is closed with 2.0 vicryl and subcuticular with running 4.0 Monocryl. Tanner incision is cleaned and dried and steri-strips and a bulky sterile dressing are applied. Tanner limb is placed into a knee immobilizer and Tanner patient is awakened and transported to recovery in stable condition.      Please note that a surgical assistant was a medical necessity for this procedure in order to perform it in a safe and expeditious manner. Surgical assistant was necessary to retract Tanner ligaments and vital neurovascular structures to prevent injury to them and also necessary for proper positioning of Tanner limb to allow for anatomic placement of Tanner prosthesis.   Gus Rankin Kenslie Abbruzzese, MD    08/10/2023, 8:22 AM

## 2023-08-10 NOTE — Anesthesia Procedure Notes (Signed)
 Procedure Name: LMA Insertion Date/Time: 08/10/2023 7:31 AM  Performed by: Randa Evens, CRNAPre-anesthesia Checklist: Patient identified, Emergency Drugs available, Suction available and Patient being monitored Patient Re-evaluated:Patient Re-evaluated prior to induction Oxygen Delivery Method: Circle System Utilized Preoxygenation: Pre-oxygenation with 100% oxygen Induction Type: IV induction Ventilation: Mask ventilation without difficulty LMA: LMA inserted LMA Size: 5.0 Number of attempts: 1 Airway Equipment and Method: Bite block Placement Confirmation: positive ETCO2 Tube secured with: Tape Dental Injury: Teeth and Oropharynx as per pre-operative assessment

## 2023-08-11 ENCOUNTER — Encounter (HOSPITAL_COMMUNITY): Payer: Self-pay | Admitting: Orthopedic Surgery

## 2023-08-11 NOTE — Discharge Summary (Signed)
 Physician Discharge Summary  Patient ID: Tanner [name] MRN: [id number] DOB/AGE: 79-Jul-1947 79 y.o.  Admit date: 08/10/2023 Discharge date: 08/10/2023   Procedures:  Procedure(s) (LRB): ARTHROPLASTY, KNEE, TOTAL (Right)  Attending Physician: Ollen Gross, MD  Admission Diagnoses:   Osteoarthritis right knee   Discharge Diagnoses:  Osteoarthritis right knee    Past Medical History:  Diagnosis Date   ADD (attention deficit disorder)    Allergy    environmental   Anxiety    Arm thromboembolism, superficial, acute    from IV site, left arm   Arthritis    "thumbs; right knee" (05/01/2016)   Arthritis    Atypical mole 01/27/2007   right shoulder slight to moderate   Basal cell carcinoma 12/16/1995   right upper back dr Alma Friendly   [facility] (basal cell carcinoma of skin) 04/11/1996   left chest dr Alma Friendly   [facility] (basal cell carcinoma of skin) 12/05/2004   post lower left ear mohs   BCC (basal cell carcinoma of skin) 07/14/2012   left post ear tx with bx   BPH (benign prostatic hypertrophy)    Cataract    removed bilat   Depression    Esophageal reflux    Gynecomastia    Heart murmur    High cholesterol    IBS (irritable bowel syndrome)    Rosacea    SCC (squamous cell carcinoma) 02/19/2011   right post scalp cx3 cautery   SCC (squamous cell carcinoma) 07/08/2011   middle of scalp tx with bx   SCC (squamous cell carcinoma) 09/19/2013   scalp cx3 20fu   SCC (squamous cell carcinoma) 07/20/2016   top scalp cx3 69fu   SCC (squamous cell carcinoma) 07/20/2016   right side of neck   SCC (squamous cell carcinoma) 08/18/2017   behind left ear tx with bx   SCC (squamous cell carcinoma) 01/25/2018   left post scalp cx3 88fu   SCC (squamous cell carcinoma) 01/25/2018   right side scalp cx3 59fu   SCC (squamous cell carcinoma) 01/25/2018   mid post scalp cx3 64fu   SCC (squamous cell carcinoma) 02/22/2019   left front scalp cx3 1fu   SCC (squamous cell carcinoma) 03/17/2019    right front scalp tx with bx   SCC (squamous cell carcinoma) 04/21/2019   top of scalp cx3 72fu   Sleep apnea    central sleep apnea, no cpap   Squamous cell carcinoma of skin 02/19/2011   post scalp cx3 cautery with bx    PCP: Darrow Bussing, MD   Discharged Condition: stable  Hospital Course:  Patient underwent the above stated procedure on 08/10/2023. Patient tolerated the procedure well and brought to the recovery room in good condition. Patient had an uncomplicated hospital course and was stable for discharge.   Disposition: Discharge disposition: 01-Home or Self Care      with follow up in 2 weeks    Follow-up Information     Ollen Gross, MD Follow up on 08/26/2023.   Specialty: Orthopedic Surgery Why: You are scheduled for post op appointment on Wednesday 08/26/23 at 2:00pm Contact information: [address] [place] [address] [phone]         [facility] Go in 3 day(s).   Why: You are scheduled for physical therapy appointment on Thursday 08/13/23 at 1:45pm.  [address] Contact information: [address] [place] [address] [phone]  Discharge Instructions     Call MD / Call 911   Complete by: As directed    If you experience chest pain or shortness of breath, CALL 911 and be transported to the hospital emergency room.  If you develope a fever above 101 F, pus (white drainage) or increased drainage or redness at the wound, or calf pain, call your surgeon's office.   Change dressing   Complete by: As directed    You may remove the bulky bandage (ACE wrap and gauze) two days after surgery. You will have an adhesive waterproof bandage underneath. Leave this in place until your first follow-up appointment.   Constipation Prevention   Complete by: As directed    Drink plenty of fluids.  Prune juice may be helpful.  You may use a stool softener, such as Colace (over the counter) 100 mg  twice a day.  Use MiraLax (over the counter) for constipation as needed.   Diet - low sodium heart healthy   Complete by: As directed    Do not put a pillow under the knee. Place it under the heel.   Complete by: As directed    Driving restrictions   Complete by: As directed    No driving for two weeks   Post-operative opioid taper instructions:   Complete by: As directed    POST-OPERATIVE OPIOID TAPER INSTRUCTIONS: It is important to wean off of your opioid medication as soon as possible. If you do not need pain medication after your surgery it is ok to stop day one. Opioids include: Codeine, Hydrocodone(Norco, Vicodin), Oxycodone(Percocet, oxycontin) and hydromorphone amongst others.  Long term and even short term use of opiods can cause: Increased pain response Dependence Constipation Depression Respiratory depression And more.  Withdrawal symptoms can include Flu like symptoms Nausea, vomiting And more Techniques to manage these symptoms Hydrate well Eat regular healthy meals Stay active Use relaxation techniques(deep breathing, meditating, yoga) Do Not substitute Alcohol to help with tapering If you have been on opioids for less than two weeks and do not have pain than it is ok to stop all together.  Plan to wean off of opioids This plan should start within one week post op of your joint replacement. Maintain the same interval or time between taking each dose and first decrease the dose.  Cut the total daily intake of opioids by one tablet each day Next start to increase the time between doses. The last dose that should be eliminated is the evening dose.      TED hose   Complete by: As directed    Use stockings (TED hose) for three weeks on both leg(s).  You may remove them at night for sleeping.   Weight bearing as tolerated   Complete by: As directed        Allergies as of 08/10/2023       Reactions   Valium [diazepam] Anxiety   Becomes irrational and  irritable        Medication List     STOP taking these medications    aspirin 81 MG tablet   ibuprofen 200 MG tablet Commonly known as: ADVIL       TAKE these medications    amphetamine-dextroamphetamine 20 MG 24 hr capsule Commonly known as: ADDERALL XR Take 20 mg by mouth 2 (two) times daily.   buPROPion 150 MG 24 hr tablet Commonly known as: WELLBUTRIN XL Take 150 mg by mouth every morning.   cholecalciferol 25 MCG (1000 UNIT)  tablet Commonly known as: VITAMIN D3 Take 1,000 Units by mouth daily.   clindamycin 1 % lotion Commonly known as: CLEOCIN T Apply 1 application  topically daily as needed (irritation).   clindamycin-benzoyl peroxide gel Commonly known as: BENZACLIN Apply 1 Application topically daily.   clotrimazole-betamethasone cream Commonly known as: LOTRISONE Apply 1 Application topically 2 (two) times daily.   desvenlafaxine 25 MG 24 hr tablet Commonly known as: PRISTIQ Take 25 mg by mouth every morning.   finasteride 5 MG tablet Commonly known as: PROSCAR Take 5 mg by mouth daily.   ipratropium 0.03 % nasal spray Commonly known as: ATROVENT Place 2 sprays into both nostrils daily.   Ivermectin 1 % Crea Apply 1 Application topically daily.   ketoconazole 2 % cream Commonly known as: NIZORAL Apply 1 Application topically daily as needed for irritation.   methocarbamol 500 MG tablet Commonly known as: ROBAXIN Take 1 tablet (500 mg total) by mouth every 6 (six) hours as needed for muscle spasms.   multivitamin capsule Take 1 capsule by mouth daily.   omeprazole 40 MG capsule Commonly known as: PRILOSEC Take 40 mg by mouth daily.   oxyCODONE 5 MG immediate release tablet Commonly known as: Roxicodone Take 1 tablet (5 mg total) by mouth every 6 (six) hours as needed for severe pain (pain score 7-10).   REFRESH TEARS OP Apply 1 drop to eye 3 (three) times daily as needed (dry eyes).   rivaroxaban 10 MG Tabs tablet Commonly  known as: XARELTO Take 1 tablet (10 mg total) by mouth daily for 21 days. Then resume an 81 mg aspirin once a day.   rosuvastatin 20 MG tablet Commonly known as: CRESTOR TAKE 1 TABLET BY MOUTH EVERY DAY   sildenafil 20 MG tablet Commonly known as: REVATIO Take 20-100 mg by mouth as needed (ED).               Discharge Care Instructions  (From admission, onward)           Start     Ordered   08/10/23 0000  Weight bearing as tolerated        08/10/23 0721   08/10/23 0000  Change dressing       Comments: You may remove the bulky bandage (ACE wrap and gauze) two days after surgery. You will have an adhesive waterproof bandage underneath. Leave this in place until your first follow-up appointment.   08/10/23 0721              Signed: R. Arcola Jansky, PA-C Orthopedic Surgery 08/11/2023, 1:27 PM

## 2023-08-13 DIAGNOSIS — M25661 Stiffness of right knee, not elsewhere classified: Secondary | ICD-10-CM | POA: Diagnosis not present

## 2023-08-13 DIAGNOSIS — M6281 Muscle weakness (generalized): Secondary | ICD-10-CM | POA: Diagnosis not present

## 2023-08-13 DIAGNOSIS — M25561 Pain in right knee: Secondary | ICD-10-CM | POA: Diagnosis not present

## 2023-08-18 DIAGNOSIS — M6281 Muscle weakness (generalized): Secondary | ICD-10-CM | POA: Diagnosis not present

## 2023-08-18 DIAGNOSIS — M25661 Stiffness of right knee, not elsewhere classified: Secondary | ICD-10-CM | POA: Diagnosis not present

## 2023-08-18 DIAGNOSIS — M25561 Pain in right knee: Secondary | ICD-10-CM | POA: Diagnosis not present

## 2023-08-20 DIAGNOSIS — M6281 Muscle weakness (generalized): Secondary | ICD-10-CM | POA: Diagnosis not present

## 2023-08-20 DIAGNOSIS — M25561 Pain in right knee: Secondary | ICD-10-CM | POA: Diagnosis not present

## 2023-08-20 DIAGNOSIS — M25661 Stiffness of right knee, not elsewhere classified: Secondary | ICD-10-CM | POA: Diagnosis not present

## 2023-08-25 DIAGNOSIS — M25561 Pain in right knee: Secondary | ICD-10-CM | POA: Diagnosis not present

## 2023-08-25 DIAGNOSIS — M25661 Stiffness of right knee, not elsewhere classified: Secondary | ICD-10-CM | POA: Diagnosis not present

## 2023-08-25 DIAGNOSIS — M6281 Muscle weakness (generalized): Secondary | ICD-10-CM | POA: Diagnosis not present

## 2023-08-27 DIAGNOSIS — M6281 Muscle weakness (generalized): Secondary | ICD-10-CM | POA: Diagnosis not present

## 2023-08-27 DIAGNOSIS — M25561 Pain in right knee: Secondary | ICD-10-CM | POA: Diagnosis not present

## 2023-08-27 DIAGNOSIS — M25661 Stiffness of right knee, not elsewhere classified: Secondary | ICD-10-CM | POA: Diagnosis not present

## 2023-09-01 DIAGNOSIS — M25661 Stiffness of right knee, not elsewhere classified: Secondary | ICD-10-CM | POA: Diagnosis not present

## 2023-09-01 DIAGNOSIS — M6281 Muscle weakness (generalized): Secondary | ICD-10-CM | POA: Diagnosis not present

## 2023-09-01 DIAGNOSIS — M25561 Pain in right knee: Secondary | ICD-10-CM | POA: Diagnosis not present

## 2023-09-03 DIAGNOSIS — M6281 Muscle weakness (generalized): Secondary | ICD-10-CM | POA: Diagnosis not present

## 2023-09-03 DIAGNOSIS — M25661 Stiffness of right knee, not elsewhere classified: Secondary | ICD-10-CM | POA: Diagnosis not present

## 2023-09-03 DIAGNOSIS — M25561 Pain in right knee: Secondary | ICD-10-CM | POA: Diagnosis not present

## 2023-09-08 ENCOUNTER — Encounter: Payer: Self-pay | Admitting: Cardiovascular Disease

## 2023-09-08 DIAGNOSIS — M25561 Pain in right knee: Secondary | ICD-10-CM | POA: Diagnosis not present

## 2023-09-08 DIAGNOSIS — M25661 Stiffness of right knee, not elsewhere classified: Secondary | ICD-10-CM | POA: Diagnosis not present

## 2023-09-08 DIAGNOSIS — M6281 Muscle weakness (generalized): Secondary | ICD-10-CM | POA: Diagnosis not present

## 2023-09-10 DIAGNOSIS — Z5189 Encounter for other specified aftercare: Secondary | ICD-10-CM | POA: Diagnosis not present

## 2023-09-15 DIAGNOSIS — M25561 Pain in right knee: Secondary | ICD-10-CM | POA: Diagnosis not present

## 2023-09-15 DIAGNOSIS — M25661 Stiffness of right knee, not elsewhere classified: Secondary | ICD-10-CM | POA: Diagnosis not present

## 2023-09-15 DIAGNOSIS — M6281 Muscle weakness (generalized): Secondary | ICD-10-CM | POA: Diagnosis not present

## 2023-09-17 DIAGNOSIS — M25661 Stiffness of right knee, not elsewhere classified: Secondary | ICD-10-CM | POA: Diagnosis not present

## 2023-09-17 DIAGNOSIS — M6281 Muscle weakness (generalized): Secondary | ICD-10-CM | POA: Diagnosis not present

## 2023-09-17 DIAGNOSIS — M25561 Pain in right knee: Secondary | ICD-10-CM | POA: Diagnosis not present

## 2023-09-21 DIAGNOSIS — H6123 Impacted cerumen, bilateral: Secondary | ICD-10-CM | POA: Diagnosis not present

## 2023-09-21 DIAGNOSIS — H903 Sensorineural hearing loss, bilateral: Secondary | ICD-10-CM | POA: Diagnosis not present

## 2023-09-22 DIAGNOSIS — M25561 Pain in right knee: Secondary | ICD-10-CM | POA: Diagnosis not present

## 2023-09-22 DIAGNOSIS — M6281 Muscle weakness (generalized): Secondary | ICD-10-CM | POA: Diagnosis not present

## 2023-09-22 DIAGNOSIS — M25661 Stiffness of right knee, not elsewhere classified: Secondary | ICD-10-CM | POA: Diagnosis not present

## 2023-09-24 DIAGNOSIS — M6281 Muscle weakness (generalized): Secondary | ICD-10-CM | POA: Diagnosis not present

## 2023-09-24 DIAGNOSIS — M25661 Stiffness of right knee, not elsewhere classified: Secondary | ICD-10-CM | POA: Diagnosis not present

## 2023-09-24 DIAGNOSIS — M25561 Pain in right knee: Secondary | ICD-10-CM | POA: Diagnosis not present

## 2023-09-29 DIAGNOSIS — M6281 Muscle weakness (generalized): Secondary | ICD-10-CM | POA: Diagnosis not present

## 2023-09-29 DIAGNOSIS — M25661 Stiffness of right knee, not elsewhere classified: Secondary | ICD-10-CM | POA: Diagnosis not present

## 2023-09-29 DIAGNOSIS — M25561 Pain in right knee: Secondary | ICD-10-CM | POA: Diagnosis not present

## 2023-10-02 DIAGNOSIS — M25561 Pain in right knee: Secondary | ICD-10-CM | POA: Diagnosis not present

## 2023-10-02 DIAGNOSIS — M25661 Stiffness of right knee, not elsewhere classified: Secondary | ICD-10-CM | POA: Diagnosis not present

## 2023-10-02 DIAGNOSIS — M6281 Muscle weakness (generalized): Secondary | ICD-10-CM | POA: Diagnosis not present

## 2023-10-06 DIAGNOSIS — M25661 Stiffness of right knee, not elsewhere classified: Secondary | ICD-10-CM | POA: Diagnosis not present

## 2023-10-06 DIAGNOSIS — M25561 Pain in right knee: Secondary | ICD-10-CM | POA: Diagnosis not present

## 2023-10-06 DIAGNOSIS — M6281 Muscle weakness (generalized): Secondary | ICD-10-CM | POA: Diagnosis not present

## 2023-10-08 DIAGNOSIS — M25561 Pain in right knee: Secondary | ICD-10-CM | POA: Diagnosis not present

## 2023-10-08 DIAGNOSIS — M25661 Stiffness of right knee, not elsewhere classified: Secondary | ICD-10-CM | POA: Diagnosis not present

## 2023-10-08 DIAGNOSIS — M6281 Muscle weakness (generalized): Secondary | ICD-10-CM | POA: Diagnosis not present

## 2023-10-13 DIAGNOSIS — M6281 Muscle weakness (generalized): Secondary | ICD-10-CM | POA: Diagnosis not present

## 2023-10-13 DIAGNOSIS — M25561 Pain in right knee: Secondary | ICD-10-CM | POA: Diagnosis not present

## 2023-10-13 DIAGNOSIS — M25661 Stiffness of right knee, not elsewhere classified: Secondary | ICD-10-CM | POA: Diagnosis not present

## 2023-10-15 DIAGNOSIS — M25661 Stiffness of right knee, not elsewhere classified: Secondary | ICD-10-CM | POA: Diagnosis not present

## 2023-10-15 DIAGNOSIS — M25561 Pain in right knee: Secondary | ICD-10-CM | POA: Diagnosis not present

## 2023-10-15 DIAGNOSIS — M6281 Muscle weakness (generalized): Secondary | ICD-10-CM | POA: Diagnosis not present

## 2023-10-20 DIAGNOSIS — M25661 Stiffness of right knee, not elsewhere classified: Secondary | ICD-10-CM | POA: Diagnosis not present

## 2023-10-20 DIAGNOSIS — M25561 Pain in right knee: Secondary | ICD-10-CM | POA: Diagnosis not present

## 2023-10-20 DIAGNOSIS — M6281 Muscle weakness (generalized): Secondary | ICD-10-CM | POA: Diagnosis not present

## 2023-10-22 DIAGNOSIS — M6281 Muscle weakness (generalized): Secondary | ICD-10-CM | POA: Diagnosis not present

## 2023-10-22 DIAGNOSIS — M25661 Stiffness of right knee, not elsewhere classified: Secondary | ICD-10-CM | POA: Diagnosis not present

## 2023-10-22 DIAGNOSIS — M25561 Pain in right knee: Secondary | ICD-10-CM | POA: Diagnosis not present

## 2023-10-29 DIAGNOSIS — M25561 Pain in right knee: Secondary | ICD-10-CM | POA: Diagnosis not present

## 2023-10-29 DIAGNOSIS — M6281 Muscle weakness (generalized): Secondary | ICD-10-CM | POA: Diagnosis not present

## 2023-10-29 DIAGNOSIS — M25661 Stiffness of right knee, not elsewhere classified: Secondary | ICD-10-CM | POA: Diagnosis not present

## 2023-11-03 DIAGNOSIS — M25661 Stiffness of right knee, not elsewhere classified: Secondary | ICD-10-CM | POA: Diagnosis not present

## 2023-11-03 DIAGNOSIS — M25561 Pain in right knee: Secondary | ICD-10-CM | POA: Diagnosis not present

## 2023-11-03 DIAGNOSIS — M6281 Muscle weakness (generalized): Secondary | ICD-10-CM | POA: Diagnosis not present

## 2023-11-05 DIAGNOSIS — M25661 Stiffness of right knee, not elsewhere classified: Secondary | ICD-10-CM | POA: Diagnosis not present

## 2023-11-05 DIAGNOSIS — M25561 Pain in right knee: Secondary | ICD-10-CM | POA: Diagnosis not present

## 2023-11-05 DIAGNOSIS — M6281 Muscle weakness (generalized): Secondary | ICD-10-CM | POA: Diagnosis not present

## 2023-11-09 NOTE — Patient Instructions (Signed)
 SURGICAL WAITING ROOM VISITATION  Patients having surgery or a procedure may have no more than 2 support people in the waiting area - these visitors may rotate.    Children under the age of 66 must have an adult with them who is not the patient.  Visitors with respiratory illnesses are discouraged from visiting and should remain at home.  If the patient needs to stay at the hospital during part of their recovery, the visitor guidelines for inpatient rooms apply. Pre-op nurse will coordinate an appropriate time for 1 support person to accompany patient in pre-op.  This support person may not rotate.    Please refer to the [facility] website for the visitor guidelines for Inpatients (after your surgery is over and you are in a regular room).       Your procedure is scheduled on:   11/18/2023    Report to [facility] Main Entrance    Report to admitting at   1045AM   Call this number if you have problems the morning of surgery [phone]   Do not eat food :After Midnight.   After Midnight you may have the following liquids until __ 1000____ AM DAY OF SURGERY  Water  Non-Citrus Juices (without pulp, NO RED-Apple, White grape, White cranberry) Black Coffee (NO MILK/CREAM OR CREAMERS, sugar ok)  Clear Tea (NO MILK/CREAM OR CREAMERS, sugar ok) regular and decaf                             Plain Jell-O (NO RED)                                           Fruit ices (not with fruit pulp, NO RED)                                     Popsicles (NO RED)                                                               Sports drinks like Gatorade (NO RED)                    The day of surgery:  Drink ONE (1) Pre-Surgery Clear Ensure or G2 at  1000 AM the morning of surgery. Drink in one sitting. Do not sip.  This drink was given to you during your hospital  pre-op appointment visit. Nothing else to drink after completing the  Pre-Surgery Clear Ensure or G2.          If you have  questions, please contact your surgeon's office.        Oral Hygiene is also important to reduce your risk of infection.                                    Remember - BRUSH YOUR TEETH THE MORNING OF SURGERY WITH YOUR REGULAR TOOTHPASTE  DENTURES WILL BE REMOVED PRIOR TO SURGERY PLEASE DO NOT APPLY "Poly  grip" OR ADHESIVES!!!   Do NOT smoke after Midnight   Stop all vitamins and herbal supplements 7 days before surgery.   Take these medicines the morning of surgery with A SIP OF WATER :  wellbutrin, pristiz, proscar , omeprazole    DO NOT TAKE ANY ORAL DIABETIC MEDICATIONS DAY OF YOUR SURGERY  Bring CPAP mask and tubing day of surgery.                              You may not have any metal on your body including hair pins, jewelry, and body piercing             Do not wear make-up, lotions, powders, perfumes/cologne, or deodorant  Do not wear nail polish including gel and S&S, artificial/acrylic nails, or any other type of covering on natural nails including finger and toenails. If you have artificial nails, gel coating, etc. that needs to be removed by a nail salon please have this removed prior to surgery or surgery may need to be canceled/ delayed if the surgeon/ anesthesia feels like they are unable to be safely monitored.   Do not shave  48 hours prior to surgery.               Men may shave face and neck.   Do not bring valuables to the hospital. [place] IS NOT             RESPONSIBLE   FOR VALUABLES.   Contacts, glasses, dentures or bridgework may not be worn into surgery.   Bring small overnight bag day of surgery.   DO NOT BRING YOUR HOME MEDICATIONS TO THE HOSPITAL. PHARMACY WILL DISPENSE MEDICATIONS LISTED ON YOUR MEDICATION LIST TO YOU DURING YOUR ADMISSION IN THE HOSPITAL!    Patients discharged on the day of surgery will not be allowed to drive home.  Someone NEEDS to stay with you for the first 24 hours after anesthesia.   Special Instructions: Bring a copy  of your healthcare power of attorney and living will documents the day of surgery if you haven't scanned them before.              Please read over the following fact sheets you were given: IF YOU HAVE QUESTIONS ABOUT YOUR PRE-OP INSTRUCTIONS PLEASE CALL [phone]   If you received a COVID test during your pre-op visit  it is requested that you wear a mask when out in public, stay away from anyone that may not be feeling well and notify your surgeon if you develop symptoms. If you test positive for Covid or have been in contact with anyone that has tested positive in the last 10 days please notify you surgeon.    [place] - Preparing for Surgery Before surgery, you can play an important role.  Because skin is not sterile, your skin needs to be as free of germs as possible.  You can reduce the number of germs on your skin by washing with CHG (chlorahexidine gluconate) soap before surgery.  CHG is an antiseptic cleaner which kills germs and bonds with the skin to continue killing germs even after washing. Please DO NOT use if you have an allergy to CHG or antibacterial soaps.  If your skin becomes reddened/irritated stop using the CHG and inform your nurse when you arrive at Short Stay. Do not shave (including legs and underarms) for at least 48 hours prior to the first CHG shower.  You may shave your face/neck. Please follow these instructions carefully:  1.  Shower with CHG Soap the night before surgery and the  morning of Surgery.  2.  If you choose to wash your hair, wash your hair first as usual with your  normal  shampoo.  3.  After you shampoo, rinse your hair and body thoroughly to remove the  shampoo.                           4.  Use CHG as you would any other liquid soap.  You can apply chg directly  to the skin and wash                       Gently with a scrungie or clean washcloth.  5.  Apply the CHG Soap to your body ONLY FROM THE NECK DOWN.   Do not use on face/ open                            Wound or open sores. Avoid contact with eyes, ears mouth and genitals (private parts).                       Wash face,  Genitals (private parts) with your normal soap.             6.  Wash thoroughly, paying special attention to the area where your surgery  will be performed.  7.  Thoroughly rinse your body with warm water  from the neck down.  8.  DO NOT shower/wash with your normal soap after using and rinsing off  the CHG Soap.                9.  Pat yourself dry with a clean towel.            10.  Wear clean pajamas.            11.  Place clean sheets on your bed the night of your first shower and do not  sleep with pets. Day of Surgery : Do not apply any lotions/deodorants the morning of surgery.  Please wear clean clothes to the hospital/surgery center.  FAILURE TO FOLLOW THESE INSTRUCTIONS MAY RESULT IN THE CANCELLATION OF YOUR SURGERY PATIENT SIGNATURE_________________________________  NURSE SIGNATURE__________________________________  ________________________________________________________________________

## 2023-11-09 NOTE — Progress Notes (Signed)
 Anesthesia Review:  PCP: Cardiologist :  PPM/ ICD: Device Orders: Rep Notified:  Chest x-ray : EKG : 07/28/23  CT Card- 04/13/22  Echo : Stress test: Cardiac Cath :   Activity level:  Sleep Study/ CPAP : Fasting Blood Sugar :      / Checks Blood Sugar -- times a day:    Blood Thinner/ Instructions /Last Dose: ASA / Instructions/ Last Dose :    08/10/23- right knee

## 2023-11-11 ENCOUNTER — Encounter (HOSPITAL_COMMUNITY): Payer: Self-pay

## 2023-11-11 ENCOUNTER — Encounter (HOSPITAL_COMMUNITY)
Admission: RE | Admit: 2023-11-11 | Discharge: 2023-11-11 | Disposition: A | Discharge status: Home / Self Care | Source: Ambulatory Visit | Attending: Orthopedic Surgery | Admitting: Orthopedic Surgery

## 2023-11-11 ENCOUNTER — Other Ambulatory Visit: Payer: Self-pay

## 2023-11-11 VITALS — Ht 70.5 in | Wt 158.0 lb

## 2023-11-11 DIAGNOSIS — Z01818 Encounter for other preprocedural examination: Secondary | ICD-10-CM

## 2023-11-12 ENCOUNTER — Encounter: Payer: Self-pay | Admitting: Cardiovascular Disease

## 2023-11-18 ENCOUNTER — Ambulatory Visit (HOSPITAL_COMMUNITY): Admitting: Anesthesiology

## 2023-11-18 ENCOUNTER — Ambulatory Visit (HOSPITAL_COMMUNITY)
Admission: RE | Admit: 2023-11-18 | Discharge: 2023-11-18 | Disposition: A | Discharge status: Home / Self Care | Attending: Orthopedic Surgery | Admitting: Orthopedic Surgery

## 2023-11-18 ENCOUNTER — Encounter (HOSPITAL_COMMUNITY): Payer: Self-pay | Admitting: Orthopedic Surgery

## 2023-11-18 ENCOUNTER — Ambulatory Visit (HOSPITAL_COMMUNITY): Payer: Self-pay | Admitting: Physician Assistant

## 2023-11-18 ENCOUNTER — Encounter (HOSPITAL_COMMUNITY)
Admission: RE | Disposition: A | Discharge status: Home / Self Care | Payer: Self-pay | Source: Home / Self Care | Attending: Orthopedic Surgery

## 2023-11-18 DIAGNOSIS — Z96651 Presence of right artificial knee joint: Secondary | ICD-10-CM

## 2023-11-18 DIAGNOSIS — F988 Other specified behavioral and emotional disorders with onset usually occurring in childhood and adolescence: Secondary | ICD-10-CM | POA: Insufficient documentation

## 2023-11-18 DIAGNOSIS — M24661 Ankylosis, right knee: Secondary | ICD-10-CM | POA: Diagnosis not present

## 2023-11-18 DIAGNOSIS — Z96611 Presence of right artificial shoulder joint: Secondary | ICD-10-CM | POA: Diagnosis not present

## 2023-11-18 DIAGNOSIS — G4730 Sleep apnea, unspecified: Secondary | ICD-10-CM | POA: Diagnosis not present

## 2023-11-18 DIAGNOSIS — Z01818 Encounter for other preprocedural examination: Secondary | ICD-10-CM

## 2023-11-18 DIAGNOSIS — K219 Gastro-esophageal reflux disease without esophagitis: Secondary | ICD-10-CM | POA: Diagnosis not present

## 2023-11-18 DIAGNOSIS — M1990 Unspecified osteoarthritis, unspecified site: Secondary | ICD-10-CM | POA: Diagnosis not present

## 2023-11-18 HISTORY — PX: KNEE CLOSED REDUCTION: SHX995

## 2023-11-18 SURGERY — MANIPULATION, KNEE, CLOSED
Anesthesia: General | Site: Knee | Laterality: Right

## 2023-11-18 MED ORDER — DEXAMETHASONE SODIUM PHOSPHATE 10 MG/ML IJ SOLN: 8.0000 mg | Freq: Once | INTRAMUSCULAR | Status: DC

## 2023-11-18 MED ORDER — PROPOFOL 10 MG/ML IV BOLUS
INTRAVENOUS | Status: DC | PRN
  Administered 2023-11-18: 140 mg via INTRAVENOUS

## 2023-11-18 MED ORDER — FENTANYL CITRATE PF 50 MCG/ML IJ SOSY: 100.0000 ug | PREFILLED_SYRINGE | INTRAMUSCULAR | Status: DC

## 2023-11-18 MED ORDER — MIDAZOLAM HCL 2 MG/2ML IJ SOLN: 2.0000 mg | INTRAMUSCULAR | Status: DC

## 2023-11-18 MED ORDER — ACETAMINOPHEN 500 MG PO TABS
1000.0000 mg | ORAL_TABLET | Freq: Once | ORAL | Status: AC
  Administered 2023-11-18: 1000 mg via ORAL
  Filled 2023-11-18: qty 2

## 2023-11-18 MED ORDER — ORAL CARE MOUTH RINSE: 15.0000 mL | Freq: Once | OROMUCOSAL | Status: AC

## 2023-11-18 MED ORDER — LIDOCAINE HCL (CARDIAC) PF 100 MG/5ML IV SOSY
PREFILLED_SYRINGE | INTRAVENOUS | Status: DC | PRN
  Administered 2023-11-18: 50 mg via INTRATRACHEAL

## 2023-11-18 MED ORDER — LACTATED RINGERS IV SOLN: INTRAVENOUS | Status: DC

## 2023-11-18 MED ORDER — FENTANYL CITRATE PF 50 MCG/ML IJ SOSY
PREFILLED_SYRINGE | INTRAMUSCULAR | Status: AC
  Filled 2023-11-18: qty 1

## 2023-11-18 MED ORDER — FENTANYL CITRATE PF 50 MCG/ML IJ SOSY
25.0000 ug | PREFILLED_SYRINGE | INTRAMUSCULAR | Status: DC | PRN
  Administered 2023-11-18: 50 ug via INTRAVENOUS

## 2023-11-18 MED ORDER — OXYCODONE HCL 5 MG PO TABS
5.0000 mg | ORAL_TABLET | Freq: Once | ORAL | Status: AC | PRN
  Administered 2023-11-18: 5 mg via ORAL

## 2023-11-18 MED ORDER — OXYCODONE HCL 5 MG/5ML PO SOLN: 5.0000 mg | Freq: Once | ORAL | Status: AC | PRN

## 2023-11-18 MED ORDER — ONDANSETRON HCL 4 MG/2ML IJ SOLN: 4.0000 mg | Freq: Once | INTRAMUSCULAR | Status: DC | PRN

## 2023-11-18 MED ORDER — CHLORHEXIDINE GLUCONATE 0.12 % MT SOLN
15.0000 mL | Freq: Once | OROMUCOSAL | Status: AC
  Administered 2023-11-18: 15 mL via OROMUCOSAL

## 2023-11-18 MED ORDER — ACETAMINOPHEN 10 MG/ML IV SOLN
1000.0000 mg | Freq: Four times a day (QID) | INTRAVENOUS | Status: DC
  Filled 2023-11-18: qty 100

## 2023-11-18 MED ORDER — OXYCODONE HCL 5 MG PO TABS: 5.0000 mg | ORAL_TABLET | Freq: Four times a day (QID) | ORAL | 0 refills | Status: DC | PRN

## 2023-11-18 MED ORDER — OXYCODONE HCL 5 MG PO TABS
ORAL_TABLET | ORAL | Status: AC
  Filled 2023-11-18: qty 1

## 2023-11-18 SURGICAL SUPPLY — 14 items
BAG COUNTER SPONGE SURGICOUNT (BAG) IMPLANT
BNDG ADH 1X3 SHEER STRL LF (GAUZE/BANDAGES/DRESSINGS) IMPLANT
COVER SURGICAL LIGHT HANDLE (MISCELLANEOUS) ×1 IMPLANT
GAUZE SPONGE 4X4 12PLY STRL (GAUZE/BANDAGES/DRESSINGS) IMPLANT
GLOVE BIO SURGEON STRL SZ 6.5 (GLOVE) ×2 IMPLANT
GLOVE BIO SURGEON STRL SZ8 (GLOVE) ×2 IMPLANT
GLOVE BIOGEL PI IND STRL 7.0 (GLOVE) ×2 IMPLANT
GLOVE BIOGEL PI IND STRL 8 (GLOVE) ×1 IMPLANT
GLOVE SURG SS PI 7.0 STRL IVOR (GLOVE) ×1 IMPLANT
GOWN STRL REUS W/ TWL LRG LVL3 (GOWN DISPOSABLE) ×1 IMPLANT
KIT TURNOVER KIT A (KITS) ×2 IMPLANT
NDL SAFETY ECLIPSE 18X1.5 (NEEDLE) IMPLANT
SWABSTICK PVP IODINE (MISCELLANEOUS) ×1 IMPLANT
SYR CONTROL 10ML LL (SYRINGE) IMPLANT

## 2023-11-18 NOTE — Anesthesia Procedure Notes (Signed)
 Procedure Name: General with mask airway Date/Time: 11/18/2023 3:01 PM  Performed by: [place], Avi Lemma, CRNAPre-anesthesia Checklist: Patient identified, Emergency Drugs available, Suction available, Patient being monitored and Timeout performed Patient Re-evaluated:Patient Re-evaluated prior to induction Oxygen Delivery Method: Circle system utilized Preoxygenation: Pre-oxygenation with 100% oxygen Induction Type: IV induction and Inhalational induction Dental Injury: Teeth and Oropharynx as per pre-operative assessment

## 2023-11-18 NOTE — Transfer of Care (Signed)
 Immediate Anesthesia Transfer of Care Note  Patient: Tanner [name]  Procedure(s) Performed: MANIPULATION, KNEE, CLOSED (Right: Knee)  Patient Location: PACU  Anesthesia Type:General  Level of Consciousness: drowsy  Airway & Oxygen Therapy: Patient Spontanous Breathing and Patient connected to face mask oxygen  Post-op Assessment: Report given to RN and Post -op Vital signs reviewed and stable  Post vital signs: Reviewed and stable  Last Vitals:  Vitals Value Taken Time  BP    Temp    Pulse    Resp    SpO2      Last Pain:  Vitals:   11/18/23 1126  TempSrc:   PainSc: 1       Patients Stated Pain Goal: 3 (11/18/23 1115)  Complications: No notable events documented.

## 2023-11-18 NOTE — Anesthesia Preprocedure Evaluation (Addendum)
 Anesthesia Evaluation  Patient identified by MRN, date of birth, ID band Patient awake    Reviewed: Allergy & Precautions, NPO status , Patient's Chart, lab work & pertinent test results  History of Anesthesia Complications Negative for: history of anesthetic complications  Airway Mallampati: III  TM Distance: >3 FB Neck ROM: Full    Dental  (+) Dental Advisory Given, Teeth Intact   Pulmonary sleep apnea    Pulmonary exam normal        Cardiovascular negative cardio ROS Normal cardiovascular exam     Neuro/Psych  PSYCHIATRIC DISORDERS Anxiety Depression    negative neurological ROS     GI/Hepatic Neg liver ROS,GERD  Medicated and Controlled,,  Endo/Other  negative endocrine ROS    Renal/GU negative Renal ROS     Musculoskeletal  (+) Arthritis , Osteoarthritis,    Abdominal   Peds  (+) ATTENTION DEFICIT DISORDER WITHOUT HYPERACTIVITY Hematology negative hematology ROS (+)   Anesthesia Other Findings   Reproductive/Obstetrics                             Anesthesia Physical Anesthesia Plan  ASA: 2  Anesthesia Plan: General   Post-op Pain Management: Tylenol  PO (pre-op)* and Minimal or no pain anticipated   Induction: Intravenous  PONV Risk Score and Plan: 2 and Treatment may vary due to age or medical condition, Ondansetron  and Propofol  infusion  Airway Management Planned: Natural Airway and Mask  Additional Equipment: None  Intra-op Plan:   Post-operative Plan:   Informed Consent: I have reviewed the patients History and Physical, chart, labs and discussed the procedure including the risks, benefits and alternatives for the proposed anesthesia with the patient or authorized representative who has indicated his/her understanding and acceptance.       Plan Discussed with: CRNA and Anesthesiologist  Anesthesia Plan Comments:        Anesthesia Quick Evaluation

## 2023-11-18 NOTE — H&P (Signed)
 H&P  Subjective:  HPI: Tanner [name], 78 y.o. male is status post right total knee arthroplasty from 08/10/2023. He has not demonstrated improvement in physical therapy over the past month and continues to experience significant stiffness and limited range of motion in the right knee. He has arthrofibrosis, which is contributing to his restricted mobility and functional limitations. He also reports intermittent nerve-related pain and spasms, which are likely exacerbated by the stiffness.   Patient Active Problem List   Diagnosis Date Noted   OA (osteoarthritis) of knee 08/10/2023   Osteoarthritis of right knee 08/10/2023   Hyperlipidemia 04/01/2022   Gynecomastia 07/15/2017   S/P shoulder replacement, left 05/01/2016   Abdominal pain 10/28/2013   Avascular necrosis of hip (HCC) 05/11/2013   OA (osteoarthritis) of hip 05/11/2013   OSA (obstructive sleep apnea) 10/17/2011    Past Medical History:  Diagnosis Date   ADD (attention deficit disorder)    Allergy    environmental   Anxiety    Arm thromboembolism, superficial, acute    from IV site, left arm   Arthritis    thumbs; right knee (05/01/2016)   Arthritis    Atypical mole 01/27/2007   right shoulder slight to moderate   Basal cell carcinoma 12/16/1995   right upper back dr Christy Crandall   [facility] (basal cell carcinoma of skin) 04/11/1996   left chest dr Christy Crandall   [facility] (basal cell carcinoma of skin) 12/05/2004   post lower left ear mohs   BCC (basal cell carcinoma of skin) 07/14/2012   left post ear tx with bx   BPH (benign prostatic hypertrophy)    Cataract    removed bilat   Depression    Esophageal reflux    Gynecomastia    Heart murmur    High cholesterol    IBS (irritable bowel syndrome)    Rosacea    SCC (squamous cell carcinoma) 02/19/2011   right post scalp cx3 cautery   SCC (squamous cell carcinoma) 07/08/2011   middle of scalp tx with bx   SCC (squamous cell carcinoma) 09/19/2013   scalp cx3 80fu   SCC  (squamous cell carcinoma) 07/20/2016   top scalp cx3 1fu   SCC (squamous cell carcinoma) 07/20/2016   right side of neck   SCC (squamous cell carcinoma) 08/18/2017   behind left ear tx with bx   SCC (squamous cell carcinoma) 01/25/2018   left post scalp cx3 49fu   SCC (squamous cell carcinoma) 01/25/2018   right side scalp cx3 39fu   SCC (squamous cell carcinoma) 01/25/2018   mid post scalp cx3 60fu   SCC (squamous cell carcinoma) 02/22/2019   left front scalp cx3 43fu   SCC (squamous cell carcinoma) 03/17/2019   right front scalp tx with bx   SCC (squamous cell carcinoma) 04/21/2019   top of scalp cx3 79fu   Sleep apnea    central sleep apnea, no cpap   Squamous cell carcinoma of skin 02/19/2011   post scalp cx3 cautery with bx    Past Surgical History:  Procedure Laterality Date   BASAL CELL CARCINOMA EXCISION     left ear; right shoulder; top of head   BUNIONECTOMY Left    CARPAL TUNNEL RELEASE Right    CATARACT EXTRACTION W/ INTRAOCULAR LENS  IMPLANT, BILATERAL Bilateral    CLAVICLE SURGERY Left 1980s   cut an inch or so off it   CLOSED REDUCTION HAND FRACTURE Left ~ 1995   later took the screws out w/pliers  COLONOSCOPY  [id number]   Dr Elvin Hammer   ELBOW SURGERY Right    had a tear; sewed it back onto the bone   HERNIA REPAIR     INGUINAL HERNIA REPAIR Left 2001   JOINT REPLACEMENT     KNEE ARTHROSCOPY Right    MOUTH SURGERY     teeth removal/implants placed   NEUROMA SURGERY Left    foot   PLANTAR FASCIA RELEASE Right    SHOULDER ARTHROSCOPY W/ ROTATOR CUFF REPAIR Right    TONSILLECTOMY     TOTAL HIP ARTHROPLASTY Right 05/11/2013   Procedure: RIGHT TOTAL HIP ARTHROPLASTY ANTERIOR APPROACH;  Surgeon: Aurther Blue, MD;  Location: [facility];  Service: Orthopedics;  Laterality: Right;  WOUND CLASSIFICATION-CLEAN   TOTAL HIP ARTHROPLASTY Left 09/18/2014   Procedure: LEFT TOTAL HIP ARTHROPLASTY ANTERIOR APPROACH;  Surgeon: Liliane Rei, MD;  Location: [facility];   Service: Orthopedics;  Laterality: Left;   TOTAL KNEE ARTHROPLASTY Right 08/10/2023   Procedure: ARTHROPLASTY, KNEE, TOTAL;  Surgeon: Liliane Rei, MD;  Location: [facility];  Service: Orthopedics;  Laterality: Right;   TOTAL SHOULDER ARTHROPLASTY Left 05/01/2016   TOTAL SHOULDER ARTHROPLASTY Left 05/01/2016   Procedure: TOTAL SHOULDER ARTHROPLASTY;  Surgeon: Ellard Gunning, MD;  Location: [facility];  Service: Orthopedics;  Laterality: Left;   UMBILICAL HERNIA REPAIR  03/01/2013    Prior to Admission medications   Medication Sig Start Date End Date Taking? Authorizing Provider  amphetamine -dextroamphetamine  (ADDERALL  XR) 20 MG 24 hr capsule Take 20 mg by mouth daily.   Yes [provider]  buPROPion (WELLBUTRIN XL) 150 MG 24 hr tablet Take 150 mg by mouth every morning. 06/29/23  Yes [provider]  carboxymethylcellulose (REFRESH PLUS) 0.5 % SOLN Place 1 drop into both eyes 4 (four) times daily as needed (dry eyes).   Yes [provider]  Cholecalciferol (VITAMIN D3 SUPER STRENGTH) 50 MCG (2000 UT) TABS Take 2,000 Units by mouth daily.   Yes [provider]  clindamycin  (CLEOCIN  T) 1 % lotion Apply 1 application  topically daily. 03/24/23  Yes [provider]  clindamycin -benzoyl peroxide (BENZACLIN) gel Apply 1 Application topically daily.   Yes [provider]  clotrimazole-betamethasone  (LOTRISONE) cream Apply 1 Application topically daily.   Yes [provider]  cyclobenzaprine  (FLEXERIL ) 10 MG tablet Take 10 mg by mouth at bedtime.   Yes [provider]  desvenlafaxine  (PRISTIQ ) 25 MG 24 hr tablet Take 25 mg by mouth every morning. 07/13/23  Yes [provider]  finasteride  (PROSCAR ) 5 MG tablet Take 5 mg by mouth daily.   Yes [provider]  hydrocortisone  2.5 % ointment Apply 1 Application topically 2 (two) times daily as needed (irritation).   Yes [provider]  ibuprofen (ADVIL) 200 MG tablet  Take 400 mg by mouth at bedtime.   Yes [provider]  ipratropium (ATROVENT) 0.03 % nasal spray Place 2 sprays into both nostrils daily. 03/03/23  Yes [provider]  Ivermectin  1 % CREA Apply 1 Application topically daily. 01/19/20  Yes [provider]  ketoconazole (NIZORAL) 2 % shampoo Apply 1 Application topically as needed (eczema).   Yes [provider]  Multiple Vitamin (MULTIVITAMIN) capsule Take 1 capsule by mouth daily.   Yes [provider]  Olopatadine HCl 0.2 % SOLN Place 1 drop into both eyes daily.   Yes [provider]  omeprazole  (PRILOSEC) 40 MG capsule Take 40 mg by mouth daily. 04/16/16  Yes [provider]  rosuvastatin  (  CRESTOR ) 20 MG tablet TAKE 1 TABLET BY MOUTH EVERY DAY 01/13/23  Yes Nahser, Lela Purple, MD  sildenafil (REVATIO) 20 MG tablet Take 20-100 mg by mouth as needed (ED).    Yes [provider]  aspirin  EC 81 MG tablet Take 81 mg by mouth at bedtime. Swallow whole.    [provider]  methocarbamol  (ROBAXIN ) 500 MG tablet Take 1 tablet (500 mg total) by mouth every 6 (six) hours as needed for muscle spasms. Patient taking differently: Take 500 mg by mouth at bedtime. 08/10/23   Perla Bradford, PA    Allergies  Allergen Reactions   Valium [Diazepam] Anxiety    Becomes irrational and irritable    Social History   Socioeconomic History   Marital status: Widowed    Spouse name: Not on file   Number of children: 2   Years of education: Not on file   Highest education level: Not on file  Occupational History   Occupation: retired  Tobacco Use   Smoking status: Never   Smokeless tobacco: Never  Vaping Use   Vaping status: Never Used  Substance and Sexual Activity   Alcohol use: Yes    Alcohol/week: 7.0 standard drinks of alcohol    Types: 2 Glasses of wine, 4 Cans of beer, 1 Shots of liquor per week    Comment: OCCAS   Drug use: Never   Sexual activity: Yes  Other  Topics Concern   Not on file  Social History Narrative   ** Merged History Encounter **       Resides in [place]   Social Drivers of Health   Financial Resource Strain: Not on file  Food Insecurity: Low Risk  (09/21/2023)   Received from [facility]   Hunger Vital Sign    Within the past 12 months, you worried that your food would run out before you got money to buy more: Never true    Within the past 12 months, the food you bought just didn't last and you didn't have money to get more. : Never true  Transportation Needs: No Transportation Needs (09/21/2023)   Received from [facility]    In the past 12 months, has lack of reliable transportation kept you from medical appointments, meetings, work or from getting things needed for daily living? : No  Physical Activity: Not on file  Stress: Not on file  Social Connections: Not on file  Intimate Partner Violence: Not At Risk (08/10/2023)   Humiliation, Afraid, Rape, and Kick questionnaire    Fear of Current or Ex-Partner: No    Emotionally Abused: No    Physically Abused: No    Sexually Abused: No    Tobacco Use: Low Risk  (11/11/2023)   Patient History    Smoking Tobacco Use: Never    Smokeless Tobacco Use: Never    Passive Exposure: Not on file   Social History   Substance and Sexual Activity  Alcohol Use Yes   Alcohol/week: 7.0 standard drinks of alcohol   Types: 2 Glasses of wine, 4 Cans of beer, 1 Shots of liquor per week   Comment: OCCAS    Family History  Problem Relation Age of Onset   Heart attack Father 65   Heart disease Father    Lung disease Mother    Colon cancer Paternal Grandmother        age 45-70's   Colon polyps Paternal Grandmother    Diabetes Brother    Other Neg  Hx        gynecomastia   Esophageal cancer Neg Hx    Rectal cancer Neg Hx    Stomach cancer Neg Hx     Review of Systems  Constitutional:  Negative for chills and fever.  HENT:  Negative for congestion, sore  throat and tinnitus.   Eyes:  Negative for double vision, photophobia and pain.  Respiratory:  Negative for cough, shortness of breath and wheezing.   Cardiovascular:  Negative for chest pain, palpitations and orthopnea.  Gastrointestinal:  Negative for heartburn, nausea and vomiting.  Genitourinary:  Negative for dysuria, frequency and urgency.  Musculoskeletal:  Positive for joint pain.  Neurological:  Negative for dizziness, weakness and headaches.    Objective:  Physical Exam: General: Well-developed male in no distress.  - Musculoskeletal: Right knee demonstrates no warmth or effusion. Mild swelling is present in the lower leg. Range of motion is approximately 5-95 degrees actively and 102 degrees passively, with a firm endpoint. No instability is noted.  - Gait: Walking with a limp.  Assessment/Plan:  Arthrofibrosis, right knee  The need for a closed manipulation of the right knee to address the arthrofibrosis and improve functional range of motion was discussed with the patient by Dr. France Ina. The procedure was explained in detail, including the process of breaking up scar tissue under sedation and the importance of initiating physical therapy the following day. Patient's concerns regarding mobility immediately following the procedure were addressed. He will discharge to home after the procedure and will be outpatient therapy as soon as possible.  Sharlynn Dear, PA-C Orthopedic Surgery [facility] [place]

## 2023-11-18 NOTE — Op Note (Signed)
  OPERATIVE REPORT   PREOPERATIVE DIAGNOSIS: Arthrofibrosis, Right  knee.   POSTOPERATIVE DIAGNOSIS: Arthrofibrosis, Right knee.   PROCEDURE:  Right  knee closed manipulation.   SURGEON: Lutie Pickler, MD   ASSISTANT: None.   ANESTHESIA: General.   COMPLICATIONS: None.   CONDITION: Stable to Recovery.   Pre-manipulation range of motion is 5-90.  Post-manipulation range of  Motion is 0-125  PROCEDURE IN DETAIL: After successful administration of general  anesthetic, exam under anesthesia was performed showing range of motion  5-90 degrees. I then placed my chest against the proximal tibia,  flexing the knee with audible lysis of adhesions. I was easily able to  get the knee flexed to 125  degrees. I then put the knee back in extension and with some  patellar manipulation and gentle pressure got to full  Extension.The patient was subsequently awakened and transported to Recovery in  stable condition.    

## 2023-11-18 NOTE — Anesthesia Postprocedure Evaluation (Signed)
 Anesthesia Post Note  Patient: Tanner [name]  Procedure(s) Performed: MANIPULATION, KNEE, CLOSED (Right: Knee)     Patient location during evaluation: PACU Anesthesia Type: General Level of consciousness: awake and alert Pain management: pain level controlled Vital Signs Assessment: post-procedure vital signs reviewed and stable Respiratory status: spontaneous breathing, nonlabored ventilation and respiratory function stable Cardiovascular status: stable and blood pressure returned to baseline Anesthetic complications: no   No notable events documented.  Last Vitals:  Vitals:   11/18/23 1530 11/18/23 1605  BP: (!) 148/86 (!) 167/81  Pulse: (!) 58 (!) 57  Resp: 11 15  Temp:  (!) 36.3 C  SpO2: 96% 100%    Last Pain:  Vitals:   11/18/23 1600  TempSrc:   PainSc: 1                  Juventino Oppenheim

## 2023-11-19 ENCOUNTER — Encounter (HOSPITAL_COMMUNITY): Payer: Self-pay | Admitting: Orthopedic Surgery

## 2023-11-19 DIAGNOSIS — M25561 Pain in right knee: Secondary | ICD-10-CM | POA: Diagnosis not present

## 2023-11-19 DIAGNOSIS — M25661 Stiffness of right knee, not elsewhere classified: Secondary | ICD-10-CM | POA: Diagnosis not present

## 2023-11-19 DIAGNOSIS — M6281 Muscle weakness (generalized): Secondary | ICD-10-CM | POA: Diagnosis not present

## 2023-11-20 DIAGNOSIS — M6281 Muscle weakness (generalized): Secondary | ICD-10-CM | POA: Diagnosis not present

## 2023-11-20 DIAGNOSIS — M25661 Stiffness of right knee, not elsewhere classified: Secondary | ICD-10-CM | POA: Diagnosis not present

## 2023-11-20 DIAGNOSIS — M25561 Pain in right knee: Secondary | ICD-10-CM | POA: Diagnosis not present

## 2023-11-23 DIAGNOSIS — M6281 Muscle weakness (generalized): Secondary | ICD-10-CM | POA: Diagnosis not present

## 2023-11-23 DIAGNOSIS — E038 Other specified hypothyroidism: Secondary | ICD-10-CM | POA: Diagnosis not present

## 2023-11-23 DIAGNOSIS — M25561 Pain in right knee: Secondary | ICD-10-CM | POA: Diagnosis not present

## 2023-11-23 DIAGNOSIS — M25661 Stiffness of right knee, not elsewhere classified: Secondary | ICD-10-CM | POA: Diagnosis not present

## 2023-11-25 DIAGNOSIS — D0461 Carcinoma in situ of skin of right upper limb, including shoulder: Secondary | ICD-10-CM | POA: Diagnosis not present

## 2023-11-25 DIAGNOSIS — C4442 Squamous cell carcinoma of skin of scalp and neck: Secondary | ICD-10-CM | POA: Diagnosis not present

## 2023-11-25 DIAGNOSIS — L814 Other melanin hyperpigmentation: Secondary | ICD-10-CM | POA: Diagnosis not present

## 2023-11-25 DIAGNOSIS — C44719 Basal cell carcinoma of skin of left lower limb, including hip: Secondary | ICD-10-CM | POA: Diagnosis not present

## 2023-11-25 DIAGNOSIS — Z85828 Personal history of other malignant neoplasm of skin: Secondary | ICD-10-CM | POA: Diagnosis not present

## 2023-11-25 DIAGNOSIS — M25561 Pain in right knee: Secondary | ICD-10-CM | POA: Diagnosis not present

## 2023-11-25 DIAGNOSIS — L570 Actinic keratosis: Secondary | ICD-10-CM | POA: Diagnosis not present

## 2023-11-25 DIAGNOSIS — D225 Melanocytic nevi of trunk: Secondary | ICD-10-CM | POA: Diagnosis not present

## 2023-11-25 DIAGNOSIS — L578 Other skin changes due to chronic exposure to nonionizing radiation: Secondary | ICD-10-CM | POA: Diagnosis not present

## 2023-11-25 DIAGNOSIS — L821 Other seborrheic keratosis: Secondary | ICD-10-CM | POA: Diagnosis not present

## 2023-11-25 DIAGNOSIS — M6281 Muscle weakness (generalized): Secondary | ICD-10-CM | POA: Diagnosis not present

## 2023-11-25 DIAGNOSIS — M25661 Stiffness of right knee, not elsewhere classified: Secondary | ICD-10-CM | POA: Diagnosis not present

## 2023-11-26 DIAGNOSIS — M25661 Stiffness of right knee, not elsewhere classified: Secondary | ICD-10-CM | POA: Diagnosis not present

## 2023-11-26 DIAGNOSIS — M6281 Muscle weakness (generalized): Secondary | ICD-10-CM | POA: Diagnosis not present

## 2023-11-26 DIAGNOSIS — M25561 Pain in right knee: Secondary | ICD-10-CM | POA: Diagnosis not present

## 2023-11-30 DIAGNOSIS — M25661 Stiffness of right knee, not elsewhere classified: Secondary | ICD-10-CM | POA: Diagnosis not present

## 2023-11-30 DIAGNOSIS — M25561 Pain in right knee: Secondary | ICD-10-CM | POA: Diagnosis not present

## 2023-11-30 DIAGNOSIS — M6281 Muscle weakness (generalized): Secondary | ICD-10-CM | POA: Diagnosis not present

## 2023-12-03 DIAGNOSIS — M25661 Stiffness of right knee, not elsewhere classified: Secondary | ICD-10-CM | POA: Diagnosis not present

## 2023-12-03 DIAGNOSIS — M25561 Pain in right knee: Secondary | ICD-10-CM | POA: Diagnosis not present

## 2023-12-03 DIAGNOSIS — M6281 Muscle weakness (generalized): Secondary | ICD-10-CM | POA: Diagnosis not present

## 2023-12-09 DIAGNOSIS — M25561 Pain in right knee: Secondary | ICD-10-CM | POA: Diagnosis not present

## 2023-12-09 DIAGNOSIS — M6281 Muscle weakness (generalized): Secondary | ICD-10-CM | POA: Diagnosis not present

## 2023-12-09 DIAGNOSIS — M25661 Stiffness of right knee, not elsewhere classified: Secondary | ICD-10-CM | POA: Diagnosis not present

## 2023-12-11 DIAGNOSIS — M6281 Muscle weakness (generalized): Secondary | ICD-10-CM | POA: Diagnosis not present

## 2023-12-11 DIAGNOSIS — M25561 Pain in right knee: Secondary | ICD-10-CM | POA: Diagnosis not present

## 2023-12-11 DIAGNOSIS — M25661 Stiffness of right knee, not elsewhere classified: Secondary | ICD-10-CM | POA: Diagnosis not present

## 2023-12-16 DIAGNOSIS — M25561 Pain in right knee: Secondary | ICD-10-CM | POA: Diagnosis not present

## 2023-12-16 DIAGNOSIS — M6281 Muscle weakness (generalized): Secondary | ICD-10-CM | POA: Diagnosis not present

## 2023-12-16 DIAGNOSIS — M25661 Stiffness of right knee, not elsewhere classified: Secondary | ICD-10-CM | POA: Diagnosis not present

## 2023-12-18 DIAGNOSIS — M6281 Muscle weakness (generalized): Secondary | ICD-10-CM | POA: Diagnosis not present

## 2023-12-18 DIAGNOSIS — M25561 Pain in right knee: Secondary | ICD-10-CM | POA: Diagnosis not present

## 2023-12-18 DIAGNOSIS — M25661 Stiffness of right knee, not elsewhere classified: Secondary | ICD-10-CM | POA: Diagnosis not present

## 2023-12-21 DIAGNOSIS — H6123 Impacted cerumen, bilateral: Secondary | ICD-10-CM | POA: Diagnosis not present

## 2023-12-22 DIAGNOSIS — M25561 Pain in right knee: Secondary | ICD-10-CM | POA: Diagnosis not present

## 2023-12-22 DIAGNOSIS — M6281 Muscle weakness (generalized): Secondary | ICD-10-CM | POA: Diagnosis not present

## 2023-12-22 DIAGNOSIS — M25661 Stiffness of right knee, not elsewhere classified: Secondary | ICD-10-CM | POA: Diagnosis not present

## 2023-12-24 DIAGNOSIS — M25561 Pain in right knee: Secondary | ICD-10-CM | POA: Diagnosis not present

## 2023-12-24 DIAGNOSIS — M6281 Muscle weakness (generalized): Secondary | ICD-10-CM | POA: Diagnosis not present

## 2023-12-24 DIAGNOSIS — M25661 Stiffness of right knee, not elsewhere classified: Secondary | ICD-10-CM | POA: Diagnosis not present

## 2023-12-28 NOTE — Progress Notes (Unsigned)
 Cardiology Office Note   Date:  12/30/2023  ID:  Tanner [name], Tanner [name] 06/24/1945, MRN [id number] PCP: Regino Slater, MD  [facility] Providers Cardiologist:  Lurena MARLA Red, MD   History of Present Illness Tanner [name] is a 78 y.o. male with a history of anxiety and hyperlipidemia here for follow-up visit.  Abdominal CT scan showed calcification of his aorta and was sent to us  for further evaluation. No CP and was doing well at last visit.  His LP(a) was 88.9.  LDL 65.  Was last seen December 30, 2022 by Dr. Alveta and was doing well.  No chest pain or dyspnea.  Not exercising regularly.  Limited mobility due to his knee surgery.  Today, he presents with sleep disturbances and muscle contractions following a recent manipulation under anesthesia (MUA).  He experiences severe sleep disturbances, waking after an hour of sleep and remaining awake for two to three hours, totaling about five hours of sleep per night. This is associated with muscle contractions unresponsive to medications, including Flexeril . He is concerned about the impact of sleep deprivation on his heart health due to a family history of heart disease.  He experienced chest tightness three to four weeks ago during movement, which resolved quickly. No current chest pain is noted, but he limits exertion due to knee issues. He has not had this occur again. He did not need to take nitroglycerin.   He takes Crestor  20 mg for cholesterol management, with a previous LDL level of 76. 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  Reports no shortness of breath nor dyspnea on exertion. Reports no chest pain, pressure, or tightness. No edema, orthopnea, PND. Reports no palpitations.   Discussed the use of AI scribe software for clinical note transcription with the patient, who gave verbal consent to proceed.  Family history The patient's family history includes Colon cancer in his  paternal grandmother; Colon polyps in his paternal grandmother; Diabetes in his brother; Heart attack (age of onset: 61) in his father; Heart disease in his father; Lung disease in his mother. There is no history of Other, Esophageal cancer, Rectal cancer, or Stomach cancer.   ROS: Pertinent ROS in HPI  Studies Reviewed EKG Interpretation Date/Time:  Wednesday December 30 2023 15:39:56 EDT Ventricular Rate:  59 PR Interval:  190 QRS Duration:  78 QT Interval:  404 QTC Calculation: 399 R Axis:   0  Text Interpretation: Sinus bradycardia When compared with ECG of 28-Jul-2023 11:08, No significant change was found Confirmed by Lucien Blanc [phone]) on 12/30/2023 3:50:13 PM     Cardiac CT with calcium  score 04/13/22 IMPRESSION: Coronary calcium  score of 589. This was 64th percentile for age-, race-, and sex-matched controls.   RECOMMENDATIONS: Coronary artery calcium  (CAC) score is a strong predictor of incident coronary heart disease (CHD) and provides predictive information beyond traditional risk factors. CAC scoring is reasonable to use in the decision to withhold, postpone, or initiate statin therapy in intermediate-risk or selected borderline-risk asymptomatic adults (age 12-75 years and LDL-C >=70 to <190 mg/dL) who do not have diabetes or established atherosclerotic cardiovascular disease (ASCVD).* In intermediate-risk (10-year ASCVD risk >=7.5% to <20%) adults or selected borderline-risk (10-year ASCVD risk >=5% to <7.5%) adults in whom a CAC score is measured for the purpose of making a treatment decision the following recommendations have been made:   If CAC=0, it is reasonable to withhold statin therapy and reassess in 5 to 10 years,  as long as higher risk conditions are absent (diabetes mellitus, family history of premature CHD in first degree relatives (males <55 years; females <65 years), cigarette smoking, or LDL >=190 mg/dL).   If CAC is 1 to 99, it is reasonable to  initiate statin therapy for patients >=40 years of age.   If CAC is >=100 or >=75th percentile, it is reasonable to initiate statin therapy at any age.   Cardiology referral should be considered for patients with CAC scores >=400 or >=75th percentile.   Physical Exam VS:  BP 121/70 (BP Location: Left Arm, Patient Position: Sitting, Cuff Size: Normal)   Pulse (!) 57   Resp 16   Ht 5' 10 (1.778 m)   Wt 170 lb 12.8 oz (77.5 kg)   SpO2 97%   BMI 24.51 kg/m        Wt Readings from Last 3 Encounters:  12/30/23 170 lb 12.8 oz (77.5 kg)  11/11/23 158 lb (71.7 kg)  08/10/23 167 lb 8.8 oz (76 kg)    GEN: Well nourished, well developed in no acute distress NECK: No JVD; No carotid bruits CARDIAC: RRR, no murmurs, rubs, gallops RESPIRATORY:  Clear to auscultation without rales, wheezing or rhonchi  ABDOMEN: Soft, non-tender, non-distended EXTREMITIES:  No edema; No deformity   ASSESSMENT AND PLAN  Right knee pain and limited mobility status post knee replacement and manipulation under anesthesia Chronic pain and limited mobility due to scar tissue and muscle contractions post-surgery and MUA. - Continue prescribed physical therapy. - Follow up with orthopedic surgeon next Tuesday for further evaluation.  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 Previous muscle relaxants ineffective. - Consider daytime naps to mitigate sleep disturbances. -continue follow-up with [facility]  Hyperlipidemia on statin therapy Hyperlipidemia well-managed with Crestor . LDL within target range. Hereditary component noted. - Order cholesterol labs through [facility] for follow-up. - Schedule lab visit at convenience.  CAD -isolated episode of CP which has not reoccurred. -if pain becomes more consistent or lasting longer with activty, please contact our office -no pain with PT which he does several times a week      Dispo: He can follow-up in 6 months with Dr. Wendel  Signed, Orren LOISE Fabry, PA-C

## 2023-12-29 DIAGNOSIS — M6281 Muscle weakness (generalized): Secondary | ICD-10-CM | POA: Diagnosis not present

## 2023-12-29 DIAGNOSIS — M25561 Pain in right knee: Secondary | ICD-10-CM | POA: Diagnosis not present

## 2023-12-29 DIAGNOSIS — M25661 Stiffness of right knee, not elsewhere classified: Secondary | ICD-10-CM | POA: Diagnosis not present

## 2023-12-30 ENCOUNTER — Ambulatory Visit: Attending: Cardiology | Admitting: Physician Assistant

## 2023-12-30 ENCOUNTER — Encounter: Payer: Self-pay | Admitting: Physician Assistant

## 2023-12-30 VITALS — BP 121/70 | HR 57 | Resp 16 | Ht 70.0 in | Wt 170.8 lb

## 2023-12-30 DIAGNOSIS — I2510 Atherosclerotic heart disease of native coronary artery without angina pectoris: Secondary | ICD-10-CM | POA: Diagnosis not present

## 2023-12-30 DIAGNOSIS — Z79899 Other long term (current) drug therapy: Secondary | ICD-10-CM | POA: Diagnosis not present

## 2023-12-30 DIAGNOSIS — E782 Mixed hyperlipidemia: Secondary | ICD-10-CM | POA: Diagnosis not present

## 2023-12-30 MED ORDER — ROSUVASTATIN CALCIUM 20 MG PO TABS: 20.0000 mg | ORAL_TABLET | Freq: Every day | ORAL | 3 refills | Status: DC

## 2023-12-30 NOTE — Patient Instructions (Signed)
 Medication Instructions:  NO CHANGES *If you need a refill on your cardiac medications before your next appointment, please call your pharmacy*  Lab Work: FASTING LIPID PANEL WITHIN 2 MONTHS If you have labs (blood work) drawn today and your tests are completely normal, you will receive your results only by: MyChart Message (if you have MyChart) OR A paper copy in the mail If you have any lab test that is abnormal or we need to change your treatment, we will call you to review the results.  Testing/Procedures: NO TESTING  Follow-Up: At [facility], you and your health needs are our priority.  As part of our continuing mission to provide you with exceptional heart care, our providers are all part of one team.  This team includes your primary Cardiologist (physician) and Advanced Practice Providers or APPs (Physician Assistants and Nurse Practitioners) who all work together to provide you with the care you need, when you need it.  Your next appointment:   6 month(s)  Provider:   Arun K Thukkani, MD

## 2023-12-31 DIAGNOSIS — M25561 Pain in right knee: Secondary | ICD-10-CM | POA: Diagnosis not present

## 2023-12-31 DIAGNOSIS — M25661 Stiffness of right knee, not elsewhere classified: Secondary | ICD-10-CM | POA: Diagnosis not present

## 2023-12-31 DIAGNOSIS — M6281 Muscle weakness (generalized): Secondary | ICD-10-CM | POA: Diagnosis not present

## 2024-01-09 ENCOUNTER — Ambulatory Visit: Payer: Self-pay | Admitting: Physician Assistant

## 2024-01-09 DIAGNOSIS — E782 Mixed hyperlipidemia: Secondary | ICD-10-CM

## 2024-02-03 DIAGNOSIS — I2510 Atherosclerotic heart disease of native coronary artery without angina pectoris: Secondary | ICD-10-CM | POA: Diagnosis not present

## 2024-02-03 DIAGNOSIS — E782 Mixed hyperlipidemia: Secondary | ICD-10-CM | POA: Diagnosis not present

## 2024-02-03 DIAGNOSIS — Z79899 Other long term (current) drug therapy: Secondary | ICD-10-CM | POA: Diagnosis not present

## 2024-02-03 LAB — LIPID PANEL
Chol/HDL Ratio: 2.2 ratio (ref 0.0–5.0)
Cholesterol, Total: 170 mg/dL (ref 100–199)
HDL: 77 mg/dL (ref >39)
LDL Chol Calc (NIH): 81 mg/dL (ref 0–99)
Triglycerides: 59 mg/dL (ref 0–149)
VLDL Cholesterol Cal: 12 mg/dL (ref 5–40)

## 2024-02-19 DIAGNOSIS — N312 Flaccid neuropathic bladder, not elsewhere classified: Secondary | ICD-10-CM | POA: Diagnosis not present

## 2024-02-26 DIAGNOSIS — N312 Flaccid neuropathic bladder, not elsewhere classified: Secondary | ICD-10-CM | POA: Diagnosis not present

## 2024-02-26 DIAGNOSIS — N521 Erectile dysfunction due to diseases classified elsewhere: Secondary | ICD-10-CM | POA: Diagnosis not present

## 2024-02-26 DIAGNOSIS — R339 Retention of urine, unspecified: Secondary | ICD-10-CM | POA: Diagnosis not present

## 2024-02-26 DIAGNOSIS — N62 Hypertrophy of breast: Secondary | ICD-10-CM | POA: Diagnosis not present

## 2024-02-26 NOTE — Telephone Encounter (Signed)
 Lab orders mailed to the pt.

## 2024-03-09 DIAGNOSIS — Z96651 Presence of right artificial knee joint: Secondary | ICD-10-CM | POA: Diagnosis not present

## 2024-03-09 DIAGNOSIS — M25661 Stiffness of right knee, not elsewhere classified: Secondary | ICD-10-CM | POA: Diagnosis not present

## 2024-03-16 DIAGNOSIS — M549 Dorsalgia, unspecified: Secondary | ICD-10-CM | POA: Diagnosis not present

## 2024-03-16 DIAGNOSIS — M79604 Pain in right leg: Secondary | ICD-10-CM | POA: Diagnosis not present

## 2024-03-16 DIAGNOSIS — N400 Enlarged prostate without lower urinary tract symptoms: Secondary | ICD-10-CM | POA: Diagnosis not present

## 2024-03-17 MED ORDER — ROSUVASTATIN CALCIUM 40 MG PO TABS: 40.0000 mg | ORAL_TABLET | Freq: Every day | ORAL | 3 refills | Status: AC

## 2024-03-17 NOTE — Addendum Note (Signed)
 Addended by: CHAUVIGNE, Lakyn Mantione on: 03/17/2024 02:16 PM   Modules accepted: Orders

## 2024-03-24 DIAGNOSIS — Z974 Presence of external hearing-aid: Secondary | ICD-10-CM | POA: Diagnosis not present

## 2024-03-24 DIAGNOSIS — H6123 Impacted cerumen, bilateral: Secondary | ICD-10-CM | POA: Diagnosis not present

## 2024-03-24 DIAGNOSIS — H903 Sensorineural hearing loss, bilateral: Secondary | ICD-10-CM | POA: Diagnosis not present

## 2024-03-29 DIAGNOSIS — C4442 Squamous cell carcinoma of skin of scalp and neck: Secondary | ICD-10-CM | POA: Diagnosis not present

## 2024-03-30 DIAGNOSIS — C4442 Squamous cell carcinoma of skin of scalp and neck: Secondary | ICD-10-CM | POA: Diagnosis not present

## 2024-05-03 DIAGNOSIS — M25561 Pain in right knee: Secondary | ICD-10-CM | POA: Diagnosis not present

## 2024-06-09 ENCOUNTER — Encounter: Payer: Self-pay | Admitting: Cardiovascular Disease

## 2024-06-17 NOTE — Progress Notes (Signed)
" °  Cardiology Office Note:  .   Date:  06/20/2024  ID:  Tanner [name], DOB 02-13-1946, MRN [id number] PCP: Regino Slater, MD  [facility] Providers Cardiologist:  Lurena MARLA Red, MD   History of Present Illness: .    Chief Complaint  Patient presents with   Follow-up    Tanner [name] is a 79 y.o. male with below history who presents for follow-up.   History of Present Illness   Tanner [name] is a 79 year old male with elevated coronary calcium  and coronary hyperlipidemia who presents for follow-up.  He experiences infrequent episodes of chest pain, described as brief and lasting about five seconds, occurring two to three times a month. He also notes occasional shortness of breath, particularly when climbing stairs, but not during his regular exercise routine on a recumbent bicycle. No history of heart attack or stroke.  He has a history of depression, for which he takes Wellbutrin and Prozac. He also takes Crestor  40 mg daily for cholesterol management and is on aspirin  therapy. His most recent LDL was 63, and his coronary calcium  score was 589.  His family history is significant for heart disease, with his father, grandfather, and uncle having had heart issues.  He is a retired banker, widowed, with two daughters and three grandchildren. He does not smoke and consumes alcohol occasionally, about one drink a day or less.  He reports a past diagnosis of sleep apnea and has used CPAP machines in the past, but they were not effective. He was previously diagnosed with central apnea.           Problem List CAD -CAC 589 (64th percentile) 2. HLD -T chol 151, TG 101, LDL 63, HDL 70    ROS: All other ROS reviewed and negative. Pertinent positives noted in the HPI.     Studies Reviewed: SABRA       Physical Exam:   VS:  BP (!) 110/56   Pulse (!) 55   Ht 5' 10 (1.778 m)   Wt 170 lb 1.6 oz (77.2 kg)   SpO2 98%   BMI 24.41 kg/m    Wt Readings from Last 3  Encounters:  06/20/24 170 lb 1.6 oz (77.2 kg)  12/30/23 170 lb 12.8 oz (77.5 kg)  11/11/23 158 lb (71.7 kg)    GEN: Well nourished, well developed in no acute distress NECK: No JVD; No carotid bruits CARDIAC: RRR, no murmurs, rubs, gallops RESPIRATORY:  Clear to auscultation without rales, wheezing or rhonchi  ABDOMEN: Soft, non-tender, non-distended EXTREMITIES:  No edema; No deformity  ASSESSMENT AND PLAN: .   Assessment and Plan    Coronary artery disease with elevated coronary calcium  score Coronary calcium  score of 589, 64th percentile. No angina symptoms. Non-cardiac chest pain. Normal EKG. LDL at goal. Occasional exertional dyspnea without concerning symptoms. - Continue Crestor  40 mg daily. - Continue aspirin  therapy. - Encouraged regular exercise, monitor for increased shortness of breath or chest pain. - Scheduled follow-up in one year unless symptoms change.  Mixed hyperlipidemia LDL at goal with Crestor . No current lipid concerns. - Continue Crestor  40 mg daily.                Follow-up: Return in about 1 year (around 06/20/2025).  Signed, Darryle DASEN. Barbaraann, MD, [facility]  [facility]  [address] [place], [place] [address] [phone]  8:41 AM   "

## 2024-06-20 ENCOUNTER — Encounter (HOSPITAL_BASED_OUTPATIENT_CLINIC_OR_DEPARTMENT_OTHER): Payer: Self-pay | Admitting: Cardiovascular Disease

## 2024-06-20 ENCOUNTER — Ambulatory Visit (HOSPITAL_BASED_OUTPATIENT_CLINIC_OR_DEPARTMENT_OTHER): Admitting: Cardiovascular Disease

## 2024-06-20 VITALS — BP 110/56 | HR 55 | Ht 70.0 in | Wt 170.1 lb

## 2024-06-20 DIAGNOSIS — E782 Mixed hyperlipidemia: Secondary | ICD-10-CM | POA: Diagnosis not present

## 2024-06-20 DIAGNOSIS — R931 Abnormal findings on diagnostic imaging of heart and coronary circulation: Secondary | ICD-10-CM | POA: Diagnosis not present

## 2024-06-20 DIAGNOSIS — G4733 Obstructive sleep apnea (adult) (pediatric): Secondary | ICD-10-CM

## 2024-06-20 DIAGNOSIS — I2510 Atherosclerotic heart disease of native coronary artery without angina pectoris: Secondary | ICD-10-CM

## 2024-06-20 NOTE — Patient Instructions (Signed)
 Medication Instructions:  No changes *If you need a refill on your cardiac medications before your next appointment, please call your pharmacy*  Lab Work: none   Testing/Procedures: none  Follow-Up: At [facility], you and your health needs are our priority.  As part of our continuing mission to provide you with exceptional heart care, our providers are all part of one team.  This team includes your primary Cardiologist (physician) and Advanced Practice Providers or APPs (Physician Assistants and Nurse Practitioners) who all work together to provide you with the care you need, when you need it.  Your next appointment:   12 month(s)  Provider:   Rosaline Bane, NP or Reche Finder, NP     Other Instructions You have been referred to [facility] for sleep apnea evaluation
